# Patient Record
Sex: Male | Born: 1940 | Race: White | Hispanic: No | Marital: Married | Smoking: Former smoker
Health system: Southern US, Community
[De-identification: ages and names within clinical notes are randomized; demographics above are authoritative.]

## PROBLEM LIST (undated history)

## (undated) DIAGNOSIS — Z85038 Personal history of other malignant neoplasm of large intestine: Secondary | ICD-10-CM

## (undated) DIAGNOSIS — N321 Vesicointestinal fistula: Secondary | ICD-10-CM

## (undated) DIAGNOSIS — N183 Chronic kidney disease, stage 3 unspecified: Secondary | ICD-10-CM

## (undated) DIAGNOSIS — Z960 Presence of urogenital implants: Secondary | ICD-10-CM

## (undated) DIAGNOSIS — N135 Crossing vessel and stricture of ureter without hydronephrosis: Secondary | ICD-10-CM

## (undated) DIAGNOSIS — Z87448 Personal history of other diseases of urinary system: Secondary | ICD-10-CM

## (undated) DIAGNOSIS — Z978 Presence of other specified devices: Secondary | ICD-10-CM

## (undated) DIAGNOSIS — Z932 Ileostomy status: Secondary | ICD-10-CM

## (undated) DIAGNOSIS — Z936 Other artificial openings of urinary tract status: Secondary | ICD-10-CM

## (undated) DIAGNOSIS — N179 Acute kidney failure, unspecified: Secondary | ICD-10-CM

## (undated) DIAGNOSIS — C19 Malignant neoplasm of rectosigmoid junction: Secondary | ICD-10-CM

## (undated) DIAGNOSIS — IMO0002 Reserved for concepts with insufficient information to code with codable children: Secondary | ICD-10-CM

## (undated) DIAGNOSIS — D509 Iron deficiency anemia, unspecified: Secondary | ICD-10-CM

## (undated) DIAGNOSIS — N189 Chronic kidney disease, unspecified: Secondary | ICD-10-CM

## (undated) HISTORY — DX: Reserved for concepts with insufficient information to code with codable children: IMO0002

## (undated) HISTORY — PX: TONSILLECTOMY: SUR1361

## (undated) HISTORY — PX: OTHER SURGICAL HISTORY: SHX169

---
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
## 2004-01-26 ENCOUNTER — Ambulatory Visit (HOSPITAL_COMMUNITY): Admission: RE | Admit: 2004-01-26 | Discharge: 2004-01-26 | Payer: Self-pay | Admitting: Gastroenterology

## 2004-01-26 ENCOUNTER — Encounter (INDEPENDENT_AMBULATORY_CARE_PROVIDER_SITE_OTHER): Payer: Self-pay | Admitting: *Deleted

## 2004-02-02 ENCOUNTER — Encounter: Admission: RE | Admit: 2004-02-02 | Discharge: 2004-02-02 | Payer: Self-pay | Admitting: Gastroenterology

## 2004-03-15 ENCOUNTER — Encounter (INDEPENDENT_AMBULATORY_CARE_PROVIDER_SITE_OTHER): Payer: Self-pay | Admitting: *Deleted

## 2004-03-15 ENCOUNTER — Inpatient Hospital Stay (HOSPITAL_COMMUNITY): Admission: RE | Admit: 2004-03-15 | Discharge: 2004-03-20 | Payer: Self-pay | Admitting: General Surgery

## 2004-03-15 HISTORY — PX: OTHER SURGICAL HISTORY: SHX169

## 2009-03-16 ENCOUNTER — Ambulatory Visit (HOSPITAL_COMMUNITY): Admission: RE | Admit: 2009-03-16 | Discharge: 2009-03-16 | Payer: Self-pay | Admitting: Urology

## 2009-03-16 HISTORY — PX: OTHER SURGICAL HISTORY: SHX169

## 2009-06-18 ENCOUNTER — Ambulatory Visit (HOSPITAL_BASED_OUTPATIENT_CLINIC_OR_DEPARTMENT_OTHER): Admission: RE | Admit: 2009-06-18 | Discharge: 2009-06-18 | Payer: Self-pay | Admitting: Urology

## 2009-08-07 ENCOUNTER — Inpatient Hospital Stay (HOSPITAL_COMMUNITY): Admission: RE | Admit: 2009-08-07 | Discharge: 2009-08-14 | Payer: Self-pay | Admitting: Surgery

## 2009-08-07 ENCOUNTER — Encounter (INDEPENDENT_AMBULATORY_CARE_PROVIDER_SITE_OTHER): Payer: Self-pay | Admitting: Surgery

## 2009-08-07 ENCOUNTER — Ambulatory Visit: Payer: Self-pay | Admitting: Vascular Surgery

## 2009-08-07 HISTORY — PX: COLON SURGERY: SHX602

## 2009-08-08 ENCOUNTER — Encounter (INDEPENDENT_AMBULATORY_CARE_PROVIDER_SITE_OTHER): Payer: Self-pay | Admitting: Surgery

## 2009-08-13 ENCOUNTER — Ambulatory Visit: Payer: Self-pay | Admitting: Physical Medicine & Rehabilitation

## 2009-08-14 ENCOUNTER — Ambulatory Visit: Payer: Self-pay | Admitting: Oncology

## 2009-09-04 ENCOUNTER — Ambulatory Visit: Admission: RE | Admit: 2009-09-04 | Discharge: 2009-10-08 | Payer: Self-pay | Admitting: Radiation Oncology

## 2009-09-07 LAB — MORPHOLOGY: PLT EST: ADEQUATE

## 2009-09-07 LAB — CBC WITH DIFFERENTIAL/PLATELET
BASO%: 0.4 % (ref 0.0–2.0)
HGB: 9.5 g/dL — ABNORMAL LOW (ref 13.0–17.1)
LYMPH%: 18.1 % (ref 14.0–49.0)
NEUT#: 3.6 10*3/uL (ref 1.5–6.5)
NEUT%: 70.2 % (ref 39.0–75.0)
Platelets: 230 10*3/uL (ref 140–400)
RDW: 15.4 % — ABNORMAL HIGH (ref 11.0–14.6)
lymph#: 0.9 10*3/uL (ref 0.9–3.3)

## 2009-09-09 LAB — IRON AND TIBC
%SAT: 9 % — ABNORMAL LOW (ref 20–55)
TIBC: 293 ug/dL (ref 215–435)

## 2009-09-09 LAB — LACTATE DEHYDROGENASE: LDH: 85 U/L — ABNORMAL LOW (ref 94–250)

## 2009-09-09 LAB — CANCER ANTIGEN 19-9: CA 19-9: 84.2 U/mL — ABNORMAL HIGH (ref <35.0)

## 2009-09-09 LAB — COMPREHENSIVE METABOLIC PANEL
Albumin: 3.4 g/dL — ABNORMAL LOW (ref 3.5–5.2)
BUN: 19 mg/dL (ref 6–23)
Sodium: 142 mEq/L (ref 135–145)
Total Protein: 5.2 g/dL — ABNORMAL LOW (ref 6.0–8.3)

## 2009-09-17 ENCOUNTER — Ambulatory Visit (HOSPITAL_COMMUNITY): Admission: RE | Admit: 2009-09-17 | Discharge: 2009-09-17 | Payer: Self-pay | Admitting: Surgery

## 2009-09-17 HISTORY — PX: PORTACATH PLACEMENT: SHX2246

## 2009-09-18 ENCOUNTER — Ambulatory Visit (HOSPITAL_COMMUNITY): Admission: RE | Admit: 2009-09-18 | Discharge: 2009-09-18 | Payer: Self-pay | Admitting: Oncology

## 2009-09-18 ENCOUNTER — Ambulatory Visit: Payer: Self-pay | Admitting: Oncology

## 2009-09-20 LAB — COMPREHENSIVE METABOLIC PANEL
BUN: 18 mg/dL (ref 6–23)
CO2: 27 mEq/L (ref 19–32)
Calcium: 9.1 mg/dL (ref 8.4–10.5)
Chloride: 108 mEq/L (ref 96–112)
Creatinine, Ser: 1.09 mg/dL (ref 0.40–1.50)
Glucose, Bld: 101 mg/dL — ABNORMAL HIGH (ref 70–99)
Total Bilirubin: 0.4 mg/dL (ref 0.3–1.2)

## 2009-09-20 LAB — CBC WITH DIFFERENTIAL/PLATELET
Basophils Absolute: 0 10*3/uL (ref 0.0–0.1)
Eosinophils Absolute: 0.1 10*3/uL (ref 0.0–0.5)
HCT: 28.8 % — ABNORMAL LOW (ref 38.4–49.9)
HGB: 9.6 g/dL — ABNORMAL LOW (ref 13.0–17.1)
MCH: 29.9 pg (ref 27.2–33.4)
MCHC: 33.4 g/dL (ref 32.0–36.0)
NEUT%: 80.7 % — ABNORMAL HIGH (ref 39.0–75.0)
RBC: 3.23 10*6/uL — ABNORMAL LOW (ref 4.20–5.82)
RDW: 15.2 % — ABNORMAL HIGH (ref 11.0–14.6)
WBC: 6.8 10*3/uL (ref 4.0–10.3)
lymph#: 0.8 10*3/uL — ABNORMAL LOW (ref 0.9–3.3)

## 2009-09-20 LAB — CEA: CEA: 0.7 ng/mL (ref 0.0–5.0)

## 2009-09-26 LAB — CBC WITH DIFFERENTIAL/PLATELET
Basophils Absolute: 0 10*3/uL (ref 0.0–0.1)
EOS%: 1 % (ref 0.0–7.0)
Eosinophils Absolute: 0.1 10*3/uL (ref 0.0–0.5)
HGB: 10.1 g/dL — ABNORMAL LOW (ref 13.0–17.1)
MCH: 29.6 pg (ref 27.2–33.4)
MONO#: 0.5 10*3/uL (ref 0.1–0.9)
NEUT#: 4.1 10*3/uL (ref 1.5–6.5)
RDW: 14.9 % — ABNORMAL HIGH (ref 11.0–14.6)
WBC: 5.5 10*3/uL (ref 4.0–10.3)
lymph#: 0.8 10*3/uL — ABNORMAL LOW (ref 0.9–3.3)

## 2009-10-03 LAB — CBC WITH DIFFERENTIAL/PLATELET
Basophils Absolute: 0 10*3/uL (ref 0.0–0.1)
Eosinophils Absolute: 0.2 10*3/uL (ref 0.0–0.5)
HGB: 10.1 g/dL — ABNORMAL LOW (ref 13.0–17.1)
LYMPH%: 7.7 % — ABNORMAL LOW (ref 14.0–49.0)
MONO#: 0.4 10*3/uL (ref 0.1–0.9)
NEUT#: 10 10*3/uL — ABNORMAL HIGH (ref 1.5–6.5)
Platelets: 196 10*3/uL (ref 140–400)
RBC: 3.38 10*6/uL — ABNORMAL LOW (ref 4.20–5.82)
WBC: 11.6 10*3/uL — ABNORMAL HIGH (ref 4.0–10.3)

## 2009-10-09 LAB — CBC WITH DIFFERENTIAL/PLATELET
BASO%: 0 % (ref 0.0–2.0)
Basophils Absolute: 0 10*3/uL (ref 0.0–0.1)
EOS%: 0.6 % (ref 0.0–7.0)
Eosinophils Absolute: 0.1 10*3/uL (ref 0.0–0.5)
LYMPH%: 8.2 % — ABNORMAL LOW (ref 14.0–49.0)
MCH: 30.3 pg (ref 27.2–33.4)
MCV: 89.9 fL (ref 79.3–98.0)
NEUT%: 86.8 % — ABNORMAL HIGH (ref 39.0–75.0)
Platelets: 205 10*3/uL (ref 140–400)
RBC: 3.47 10*6/uL — ABNORMAL LOW (ref 4.20–5.82)
lymph#: 0.9 10*3/uL (ref 0.9–3.3)

## 2009-10-09 LAB — COMPREHENSIVE METABOLIC PANEL
ALT: 9 U/L (ref 0–53)
Alkaline Phosphatase: 85 U/L (ref 39–117)
BUN: 17 mg/dL (ref 6–23)
CO2: 29 mEq/L (ref 19–32)
Calcium: 9 mg/dL (ref 8.4–10.5)
Creatinine, Ser: 1 mg/dL (ref 0.40–1.50)
Glucose, Bld: 92 mg/dL (ref 70–99)
Total Bilirubin: 0.3 mg/dL (ref 0.3–1.2)

## 2009-10-10 LAB — CBC WITH DIFFERENTIAL/PLATELET
Basophils Absolute: 0 10*3/uL (ref 0.0–0.1)
HCT: 30.8 % — ABNORMAL LOW (ref 38.4–49.9)
LYMPH%: 9.5 % — ABNORMAL LOW (ref 14.0–49.0)
MCHC: 33.3 g/dL (ref 32.0–36.0)
Platelets: 198 10*3/uL (ref 140–400)
RBC: 3.41 10*6/uL — ABNORMAL LOW (ref 4.20–5.82)
lymph#: 1 10*3/uL (ref 0.9–3.3)

## 2009-10-18 ENCOUNTER — Ambulatory Visit: Payer: Self-pay | Admitting: Oncology

## 2009-10-23 LAB — CBC WITH DIFFERENTIAL/PLATELET
BASO%: 0.2 % (ref 0.0–2.0)
Basophils Absolute: 0 10*3/uL (ref 0.0–0.1)
EOS%: 0.8 % (ref 0.0–7.0)
Eosinophils Absolute: 0.1 10*3/uL (ref 0.0–0.5)
MCH: 30.5 pg (ref 27.2–33.4)
MCHC: 33.6 g/dL (ref 32.0–36.0)
MONO#: 0.8 10*3/uL (ref 0.1–0.9)
MONO%: 6.8 % (ref 0.0–14.0)
NEUT%: 83.3 % — ABNORMAL HIGH (ref 39.0–75.0)
Platelets: 214 10*3/uL (ref 140–400)
lymph#: 1.1 10*3/uL (ref 0.9–3.3)

## 2009-10-23 LAB — COMPREHENSIVE METABOLIC PANEL
Albumin: 4.2 g/dL (ref 3.5–5.2)
Alkaline Phosphatase: 108 U/L (ref 39–117)
BUN: 24 mg/dL — ABNORMAL HIGH (ref 6–23)
Calcium: 9.7 mg/dL (ref 8.4–10.5)
Creatinine, Ser: 1.15 mg/dL (ref 0.40–1.50)
Sodium: 142 mEq/L (ref 135–145)
Total Bilirubin: 0.4 mg/dL (ref 0.3–1.2)
Total Protein: 6.3 g/dL (ref 6.0–8.3)

## 2009-10-23 LAB — LACTATE DEHYDROGENASE: LDH: 134 U/L (ref 94–250)

## 2009-10-24 LAB — CBC WITH DIFFERENTIAL/PLATELET
BASO%: 0.4 % (ref 0.0–2.0)
Eosinophils Absolute: 0.1 10*3/uL (ref 0.0–0.5)
HCT: 33.1 % — ABNORMAL LOW (ref 38.4–49.9)
HGB: 11.1 g/dL — ABNORMAL LOW (ref 13.0–17.1)
MCHC: 33.5 g/dL (ref 32.0–36.0)
MONO%: 6.4 % (ref 0.0–14.0)
Platelets: 217 10*3/uL (ref 140–400)
RDW: 19.5 % — ABNORMAL HIGH (ref 11.0–14.6)
WBC: 8.6 10*3/uL (ref 4.0–10.3)
lymph#: 0.8 10*3/uL — ABNORMAL LOW (ref 0.9–3.3)

## 2009-11-06 ENCOUNTER — Ambulatory Visit
Admission: RE | Admit: 2009-11-06 | Discharge: 2010-02-04 | Payer: Self-pay | Source: Home / Self Care | Attending: Radiation Oncology | Admitting: Radiation Oncology

## 2009-11-07 LAB — CBC WITH DIFFERENTIAL/PLATELET
BASO%: 0.1 % (ref 0.0–2.0)
EOS%: 1.1 % (ref 0.0–7.0)
HCT: 35 % — ABNORMAL LOW (ref 38.4–49.9)
MCHC: 34.5 g/dL (ref 32.0–36.0)
MONO#: 0.7 10*3/uL (ref 0.1–0.9)
MONO%: 7.8 % (ref 0.0–14.0)
lymph#: 0.9 10*3/uL (ref 0.9–3.3)

## 2009-11-08 LAB — RESEARCH LABS

## 2009-11-09 LAB — COMPREHENSIVE METABOLIC PANEL
Albumin: 4.1 g/dL (ref 3.5–5.2)
Alkaline Phosphatase: 111 U/L (ref 39–117)
CO2: 26 mEq/L (ref 19–32)
Calcium: 9.8 mg/dL (ref 8.4–10.5)
Chloride: 106 mEq/L (ref 96–112)
Creatinine, Ser: 1.02 mg/dL (ref 0.40–1.50)
Glucose, Bld: 87 mg/dL (ref 70–99)
Potassium: 4 mEq/L (ref 3.5–5.3)
Sodium: 141 mEq/L (ref 135–145)
Total Bilirubin: 0.4 mg/dL (ref 0.3–1.2)
Total Protein: 5.9 g/dL — ABNORMAL LOW (ref 6.0–8.3)

## 2009-11-09 LAB — IRON AND TIBC
%SAT: 20 % (ref 20–55)
TIBC: 298 ug/dL (ref 215–435)

## 2009-11-09 LAB — LACTATE DEHYDROGENASE: LDH: 132 U/L (ref 94–250)

## 2009-11-09 LAB — FERRITIN: Ferritin: 752 ng/mL — ABNORMAL HIGH (ref 22–322)

## 2009-11-19 ENCOUNTER — Ambulatory Visit: Payer: Self-pay | Admitting: Oncology

## 2009-11-20 LAB — COMPREHENSIVE METABOLIC PANEL
ALT: 41 U/L (ref 0–53)
AST: 28 U/L (ref 0–37)
Albumin: 4.1 g/dL (ref 3.5–5.2)
Chloride: 108 mEq/L (ref 96–112)
Potassium: 3.8 mEq/L (ref 3.5–5.3)
Sodium: 142 mEq/L (ref 135–145)
Total Protein: 6.3 g/dL (ref 6.0–8.3)

## 2009-11-20 LAB — CBC WITH DIFFERENTIAL/PLATELET
BASO%: 0.3 % (ref 0.0–2.0)
Basophils Absolute: 0 10*3/uL (ref 0.0–0.1)
LYMPH%: 13.1 % — ABNORMAL LOW (ref 14.0–49.0)
MCHC: 34.4 g/dL (ref 32.0–36.0)
NEUT#: 7.6 10*3/uL — ABNORMAL HIGH (ref 1.5–6.5)
Platelets: 145 10*3/uL (ref 140–400)
RBC: 3.6 10*6/uL — ABNORMAL LOW (ref 4.20–5.82)
WBC: 9.7 10*3/uL (ref 4.0–10.3)

## 2009-11-20 LAB — IRON AND TIBC: TIBC: 318 ug/dL (ref 215–435)

## 2009-11-20 LAB — FERRITIN: Ferritin: 772 ng/mL — ABNORMAL HIGH (ref 22–322)

## 2009-11-20 LAB — LACTATE DEHYDROGENASE: LDH: 128 U/L (ref 94–250)

## 2009-12-04 LAB — CBC WITH DIFFERENTIAL/PLATELET
Basophils Absolute: 0 10*3/uL (ref 0.0–0.1)
HCT: 34.1 % — ABNORMAL LOW (ref 38.4–49.9)
HGB: 11.5 g/dL — ABNORMAL LOW (ref 13.0–17.1)
MCH: 32 pg (ref 27.2–33.4)
MONO#: 0.8 10*3/uL (ref 0.1–0.9)
NEUT%: 76.3 % — ABNORMAL HIGH (ref 39.0–75.0)
WBC: 8.2 10*3/uL (ref 4.0–10.3)
lymph#: 1.1 10*3/uL (ref 0.9–3.3)

## 2009-12-04 LAB — COMPREHENSIVE METABOLIC PANEL
BUN: 13 mg/dL (ref 6–23)
CO2: 30 mEq/L (ref 19–32)
Calcium: 9.5 mg/dL (ref 8.4–10.5)
Chloride: 108 mEq/L (ref 96–112)
Creatinine, Ser: 0.96 mg/dL (ref 0.40–1.50)

## 2009-12-04 LAB — LACTATE DEHYDROGENASE: LDH: 103 U/L (ref 94–250)

## 2009-12-10 LAB — CBC WITH DIFFERENTIAL/PLATELET
Basophils Absolute: 0 10*3/uL (ref 0.0–0.1)
EOS%: 1.3 % (ref 0.0–7.0)
Eosinophils Absolute: 0.1 10*3/uL (ref 0.0–0.5)
HGB: 11.6 g/dL — ABNORMAL LOW (ref 13.0–17.1)
NEUT#: 5.6 10*3/uL (ref 1.5–6.5)
RDW: 18.2 % — ABNORMAL HIGH (ref 11.0–14.6)
lymph#: 1.4 10*3/uL (ref 0.9–3.3)

## 2009-12-21 ENCOUNTER — Ambulatory Visit: Payer: Self-pay | Admitting: Oncology

## 2009-12-24 LAB — CBC WITH DIFFERENTIAL/PLATELET
Eosinophils Absolute: 0.1 10*3/uL (ref 0.0–0.5)
LYMPH%: 13.6 % — ABNORMAL LOW (ref 14.0–49.0)
MONO#: 0.5 10*3/uL (ref 0.1–0.9)
NEUT#: 7.1 10*3/uL — ABNORMAL HIGH (ref 1.5–6.5)
Platelets: 180 10*3/uL (ref 140–400)
RBC: 3.69 10*6/uL — ABNORMAL LOW (ref 4.20–5.82)
WBC: 8.9 10*3/uL (ref 4.0–10.3)
lymph#: 1.2 10*3/uL (ref 0.9–3.3)
nRBC: 0 % (ref 0–0)

## 2009-12-24 LAB — COMPREHENSIVE METABOLIC PANEL
ALT: 18 U/L (ref 0–53)
AST: 17 U/L (ref 0–37)
Albumin: 3.8 g/dL (ref 3.5–5.2)
BUN: 24 mg/dL — ABNORMAL HIGH (ref 6–23)
Calcium: 9.3 mg/dL (ref 8.4–10.5)
Chloride: 109 mEq/L (ref 96–112)
Potassium: 4.1 mEq/L (ref 3.5–5.3)
Sodium: 142 mEq/L (ref 135–145)
Total Protein: 5.8 g/dL — ABNORMAL LOW (ref 6.0–8.3)

## 2009-12-24 LAB — LACTATE DEHYDROGENASE: LDH: 97 U/L (ref 94–250)

## 2010-01-07 LAB — CBC WITH DIFFERENTIAL/PLATELET
BASO%: 0.3 % (ref 0.0–2.0)
MCHC: 34.2 g/dL (ref 32.0–36.0)
MONO#: 0.7 10*3/uL (ref 0.1–0.9)
RBC: 3.7 10*6/uL — ABNORMAL LOW (ref 4.20–5.82)
RDW: 16.2 % — ABNORMAL HIGH (ref 11.0–14.6)
WBC: 8.7 10*3/uL (ref 4.0–10.3)
lymph#: 1.2 10*3/uL (ref 0.9–3.3)
nRBC: 0 % (ref 0–0)

## 2010-01-07 LAB — COMPREHENSIVE METABOLIC PANEL
Albumin: 3.9 g/dL (ref 3.5–5.2)
CO2: 27 mEq/L (ref 19–32)
Glucose, Bld: 83 mg/dL (ref 70–99)
Potassium: 4.1 mEq/L (ref 3.5–5.3)
Sodium: 143 mEq/L (ref 135–145)
Total Bilirubin: 0.3 mg/dL (ref 0.3–1.2)
Total Protein: 6 g/dL (ref 6.0–8.3)

## 2010-01-07 LAB — LACTATE DEHYDROGENASE: LDH: 115 U/L (ref 94–250)

## 2010-01-14 LAB — CBC WITH DIFFERENTIAL/PLATELET
Basophils Absolute: 0 10*3/uL (ref 0.0–0.1)
EOS%: 1.4 % (ref 0.0–7.0)
Eosinophils Absolute: 0.1 10*3/uL (ref 0.0–0.5)
HGB: 11.5 g/dL — ABNORMAL LOW (ref 13.0–17.1)
MCH: 33 pg (ref 27.2–33.4)
NEUT#: 3.7 10*3/uL (ref 1.5–6.5)
RDW: 15.1 % — ABNORMAL HIGH (ref 11.0–14.6)
lymph#: 0.7 10*3/uL — ABNORMAL LOW (ref 0.9–3.3)

## 2010-01-21 ENCOUNTER — Ambulatory Visit: Payer: Self-pay | Admitting: Oncology

## 2010-01-21 LAB — COMPREHENSIVE METABOLIC PANEL
ALT: 13 U/L (ref 0–53)
Alkaline Phosphatase: 56 U/L (ref 39–117)
CO2: 24 mEq/L (ref 19–32)
Creatinine, Ser: 1.16 mg/dL (ref 0.40–1.50)
Total Bilirubin: 0.4 mg/dL (ref 0.3–1.2)

## 2010-01-21 LAB — CBC WITH DIFFERENTIAL/PLATELET
Basophils Absolute: 0 10*3/uL (ref 0.0–0.1)
Eosinophils Absolute: 0.1 10*3/uL (ref 0.0–0.5)
HGB: 10.8 g/dL — ABNORMAL LOW (ref 13.0–17.1)
MCV: 96 fL (ref 79.3–98.0)
MONO#: 0.7 10*3/uL (ref 0.1–0.9)
MONO%: 14.2 % — ABNORMAL HIGH (ref 0.0–14.0)
NEUT#: 3.8 10*3/uL (ref 1.5–6.5)
RDW: 14.9 % — ABNORMAL HIGH (ref 11.0–14.6)

## 2010-01-21 LAB — LACTATE DEHYDROGENASE: LDH: 94 U/L (ref 94–250)

## 2010-02-05 ENCOUNTER — Ambulatory Visit
Admission: RE | Admit: 2010-02-05 | Discharge: 2010-02-15 | Payer: Self-pay | Source: Home / Self Care | Attending: Radiation Oncology | Admitting: Radiation Oncology

## 2010-02-05 LAB — CBC WITH DIFFERENTIAL/PLATELET
EOS%: 4.9 % (ref 0.0–7.0)
Eosinophils Absolute: 0.3 10*3/uL (ref 0.0–0.5)
HCT: 32.2 % — ABNORMAL LOW (ref 38.4–49.9)
LYMPH%: 5.5 % — ABNORMAL LOW (ref 14.0–49.0)
MCH: 33.4 pg (ref 27.2–33.4)
MCHC: 35.1 g/dL (ref 32.0–36.0)
Platelets: 201 10*3/uL (ref 140–400)
WBC: 5.3 10*3/uL (ref 4.0–10.3)

## 2010-02-11 LAB — COMPREHENSIVE METABOLIC PANEL
ALT: 12 U/L (ref 0–53)
AST: 11 U/L (ref 0–37)
Albumin: 3.6 g/dL (ref 3.5–5.2)
Alkaline Phosphatase: 53 U/L (ref 39–117)
BUN: 21 mg/dL (ref 6–23)
CO2: 26 mEq/L (ref 19–32)
Calcium: 9.2 mg/dL (ref 8.4–10.5)
Chloride: 109 mEq/L (ref 96–112)
Creatinine, Ser: 0.96 mg/dL (ref 0.40–1.50)
Glucose, Bld: 92 mg/dL (ref 70–99)
Potassium: 4.1 mEq/L (ref 3.5–5.3)
Sodium: 143 mEq/L (ref 135–145)
Total Bilirubin: 0.3 mg/dL (ref 0.3–1.2)
Total Protein: 5.2 g/dL — ABNORMAL LOW (ref 6.0–8.3)

## 2010-02-11 LAB — CBC WITH DIFFERENTIAL/PLATELET
BASO%: 0.1 % (ref 0.0–2.0)
Basophils Absolute: 0 10*3/uL (ref 0.0–0.1)
EOS%: 5.7 % (ref 0.0–7.0)
Eosinophils Absolute: 0.2 10*3/uL (ref 0.0–0.5)
HCT: 31 % — ABNORMAL LOW (ref 38.4–49.9)
HGB: 10.7 g/dL — ABNORMAL LOW (ref 13.0–17.1)
LYMPH%: 6.1 % — ABNORMAL LOW (ref 14.0–49.0)
MCH: 34.7 pg — ABNORMAL HIGH (ref 27.2–33.4)
MCHC: 34.5 g/dL (ref 32.0–36.0)
MCV: 100.7 fL — ABNORMAL HIGH (ref 79.3–98.0)
MONO#: 0.4 10*3/uL (ref 0.1–0.9)
MONO%: 10.7 % (ref 0.0–14.0)
NEUT#: 3 10*3/uL (ref 1.5–6.5)
NEUT%: 77.4 % — ABNORMAL HIGH (ref 39.0–75.0)
Platelets: 200 10*3/uL (ref 140–400)
RBC: 3.08 10*6/uL — ABNORMAL LOW (ref 4.20–5.82)
RDW: 16.6 % — ABNORMAL HIGH (ref 11.0–14.6)
WBC: 3.9 10*3/uL — ABNORMAL LOW (ref 4.0–10.3)
lymph#: 0.2 10*3/uL — ABNORMAL LOW (ref 0.9–3.3)

## 2010-02-11 LAB — LACTATE DEHYDROGENASE: LDH: 97 U/L (ref 94–250)

## 2010-02-28 ENCOUNTER — Ambulatory Visit: Payer: Self-pay | Admitting: Oncology

## 2010-03-01 ENCOUNTER — Ambulatory Visit
Admission: RE | Admit: 2010-03-01 | Discharge: 2010-03-01 | Payer: Self-pay | Source: Home / Self Care | Attending: Urology | Admitting: Urology

## 2010-03-04 LAB — CBC WITH DIFFERENTIAL/PLATELET
Eosinophils Absolute: 0.1 10*3/uL (ref 0.0–0.5)
HCT: 33.6 % — ABNORMAL LOW (ref 38.4–49.9)
HGB: 11.7 g/dL — ABNORMAL LOW (ref 13.0–17.1)
LYMPH%: 9.8 % — ABNORMAL LOW (ref 14.0–49.0)
MCHC: 34.8 g/dL (ref 32.0–36.0)
MCV: 97.7 fL (ref 79.3–98.0)
NEUT%: 81.9 % — ABNORMAL HIGH (ref 39.0–75.0)
Platelets: ADEQUATE 10*3/uL (ref 140–400)
RBC: 3.44 10*6/uL — ABNORMAL LOW (ref 4.20–5.82)
lymph#: 0.5 10*3/uL — ABNORMAL LOW (ref 0.9–3.3)

## 2010-03-04 LAB — COMPREHENSIVE METABOLIC PANEL
ALT: 14 U/L (ref 0–53)
Albumin: 3.9 g/dL (ref 3.5–5.2)
Alkaline Phosphatase: 61 U/L (ref 39–117)
Creatinine, Ser: 1.11 mg/dL (ref 0.40–1.50)
Glucose, Bld: 85 mg/dL (ref 70–99)
Total Protein: 5.7 g/dL — ABNORMAL LOW (ref 6.0–8.3)

## 2010-03-06 ENCOUNTER — Ambulatory Visit (HOSPITAL_BASED_OUTPATIENT_CLINIC_OR_DEPARTMENT_OTHER): Payer: BC Managed Care – PPO | Admitting: Oncology

## 2010-03-06 NOTE — Op Note (Signed)
NAME:  Eric Munoz, Eric Munoz NO.:  [id number]  MEDICAL RECORD NO.:  [id number]          PATIENT TYPE:  AMB  LOCATION:  [facility]                         FACILITY:  [name]  PHYSICIAN:  Bertram Millard. Jeramy Dimmick, M.D.DATE OF BIRTH:  08-29-1940  DATE OF PROCEDURE:  03/01/2010 DATE OF DISCHARGE:                              OPERATIVE REPORT   PREOPERATIVE DIAGNOSES:  Left hydronephrosis; migrated left ureteral stent.  POSTOPERATIVE DIAGNOSES:  Left hydronephrosis; migrated left ureteral stent.  PROCEDURES:  Cystoscopy, left ureteroscopy, ureteroscopic extraction of migrated left ureteral stent, left retrograde ureteral pyelogram, replacement of double-J stent on the left (7-French x 28-cm contour).  SURGEON:  Bertram Millard. Dorissa Stinnette, M.D.  ANESTHESIA:  General LMA.  COMPLICATIONS:  None.  BRIEF HISTORY:  70 year old male with recurrent adenocarcinoma, high- grade, of his pelvis/retroperitoneum.  He has had a history of adenocarcinoma of the colon with resection in the past.  Urologic consultation was requested intraoperatively in early July 2011.  The patient's left ureter was encased with a tumor.  He underwent ureteral lysis, and Dr. Patsi Sears placed a double-J stent at that time.  He presented to the office for possible extraction recently, as he has completed most of his adjuvant chemotherapy.  Cystoscopy revealed no evidence of the stent within the patient's bladder.  It was evident that the stent had migrated proximally.  I have recommended that we proceed with ureteroscopy and extraction of his migrated stent and possible observation versus replacement of the stent depending on retrograde appearance of the patient's left ureter.  Risks and complications have been discussed with the patient.  He understands these and desires to proceed.  DESCRIPTION OF PROCEDURE:  The patient was identified and marked in the holding area.  He received preoperative IV antibiotics.  He  was taken to the operating room where general anesthetic was administered.  He was placed in the dorsal lithotomy position.  Genitalia and perineum were prepped and draped.  Time-out was then called.  The procedure then commenced.  A 22-French panendoscope was advanced into his bladder.  Urethra and prostate were unremarkable, as was the bladder.  The left ureteral orifice was identified.  A guidewire was placed up as far as I could proximally in the ureter using fluoroscopic guidance.  It progressed about halfway up the ureter before I could not pass it any more.  I then removed the cystoscope and placed the ureteroscope through the urethra and into the bladder.  It was directed up into the left ureter.  The distal end of the double-J stent was obtained and I then grasped the end of the stent with a Nitinol basket and extracted the stent into the bladder.  The distal end of the stent eventually came out to the meatus.  I then placed another sensor tip guidewire up the stent and through the stent and I saw that the tip of the guidewire had passed into the area of the renal pelvis.  It was quite difficult to get the stent out through the mid ureter.  However, with gentle constant traction, I was able to eventually dislodge the stent and  remove it from the patient's body.  Over top of the first guidewire that was placed, I placed a 6 open-ended catheter, removed the guidewire, and shot a retrograde.  This revealed a normal distal ureter, but the ureter in the mid aspect was significantly deviated medially, probably due to the patient's prior surgery.  Additionally, there was significant narrowing of this ureter for approximately 3 cm.  There was proximal ureterectasis.  Because of this significant stricture, I felt it necessary to replace the stent.  The open-ended catheter was removed and over top of the remaining guidewire that had been placed into the renal pelvis, using cystoscopic  guidance, I placed a 28-cm x 7-French contour stent.  After adequate positioning was seen using fluoroscopic guidance, the guidewire was removed.  Good proximal and distal curls were seen on the stent using fluoroscopic guidance and fluoroscopic and cystoscopic visualization, respectively.  At this time, the bladder was drained.  The scope was removed.  A B and O suppository had been placed.  The patient was awakened and taken to the PACU in stable condition.  He will follow up on the 17th of February.  He was discharged on 3 days of Bactrim DS 1 p.o. b.i.d. as well as oxybutynin p.r.n. bladder spasms.     Bertram Millard. Reyna Lorenzi, M.D.     SMD/MEDQ  D:  03/01/2010  T:  03/01/2010  Job:  [id number]  cc:   [name], MD [address] [place] [address]  Samul Dada, M.D. Fax: [phone]  Radene Gunning, MD, PhD Fax: [phone]  Dr. Duane Lope  Electronically Signed by [name] M.D. on 03/06/2010 06:04:34 PM

## 2010-03-11 ENCOUNTER — Encounter: Payer: BC Managed Care – PPO | Admitting: Oncology

## 2010-03-11 ENCOUNTER — Other Ambulatory Visit (HOSPITAL_COMMUNITY): Payer: Self-pay | Admitting: Oncology

## 2010-03-11 DIAGNOSIS — Z452 Encounter for adjustment and management of vascular access device: Secondary | ICD-10-CM

## 2010-03-11 DIAGNOSIS — C19 Malignant neoplasm of rectosigmoid junction: Secondary | ICD-10-CM

## 2010-03-11 DIAGNOSIS — Z5111 Encounter for antineoplastic chemotherapy: Secondary | ICD-10-CM

## 2010-03-11 LAB — CBC WITH DIFFERENTIAL/PLATELET
Basophils Absolute: 0 10*3/uL (ref 0.0–0.1)
Eosinophils Absolute: 0.1 10*3/uL (ref 0.0–0.5)
HGB: 11 g/dL — ABNORMAL LOW (ref 13.0–17.1)
LYMPH%: 12 % — ABNORMAL LOW (ref 14.0–49.0)
MCH: 34.4 pg — ABNORMAL HIGH (ref 27.2–33.4)
MCHC: 34.8 g/dL (ref 32.0–36.0)
MCV: 98.7 fL — ABNORMAL HIGH (ref 79.3–98.0)
MONO#: 0.3 10*3/uL (ref 0.1–0.9)
NEUT#: 2.4 10*3/uL (ref 1.5–6.5)
Platelets: 173 10*3/uL (ref 140–400)
RDW: 13.5 % (ref 11.0–14.6)
WBC: 3.2 10*3/uL — ABNORMAL LOW (ref 4.0–10.3)

## 2010-03-18 ENCOUNTER — Encounter (HOSPITAL_BASED_OUTPATIENT_CLINIC_OR_DEPARTMENT_OTHER): Payer: BC Managed Care – PPO | Admitting: Oncology

## 2010-03-18 ENCOUNTER — Other Ambulatory Visit (HOSPITAL_COMMUNITY): Payer: Self-pay | Admitting: Oncology

## 2010-03-18 DIAGNOSIS — G609 Hereditary and idiopathic neuropathy, unspecified: Secondary | ICD-10-CM

## 2010-03-18 DIAGNOSIS — C19 Malignant neoplasm of rectosigmoid junction: Secondary | ICD-10-CM

## 2010-03-18 DIAGNOSIS — Z5111 Encounter for antineoplastic chemotherapy: Secondary | ICD-10-CM

## 2010-03-18 DIAGNOSIS — Z452 Encounter for adjustment and management of vascular access device: Secondary | ICD-10-CM

## 2010-03-18 LAB — COMPREHENSIVE METABOLIC PANEL
ALT: 11 U/L (ref 0–53)
AST: 16 U/L (ref 0–37)
Chloride: 107 mEq/L (ref 96–112)
Potassium: 4 mEq/L (ref 3.5–5.3)
Total Bilirubin: 0.3 mg/dL (ref 0.3–1.2)
Total Protein: 5.5 g/dL — ABNORMAL LOW (ref 6.0–8.3)

## 2010-03-18 LAB — CBC WITH DIFFERENTIAL/PLATELET
Basophils Absolute: 0 10*3/uL (ref 0.0–0.1)
Eosinophils Absolute: 0.1 10*3/uL (ref 0.0–0.5)
HGB: 11.2 g/dL — ABNORMAL LOW (ref 13.0–17.1)
MCH: 32.9 pg (ref 27.2–33.4)
MCHC: 34.1 g/dL (ref 32.0–36.0)
NEUT#: 2.8 10*3/uL (ref 1.5–6.5)
NEUT%: 77.6 % — ABNORMAL HIGH (ref 39.0–75.0)

## 2010-03-20 ENCOUNTER — Encounter (HOSPITAL_BASED_OUTPATIENT_CLINIC_OR_DEPARTMENT_OTHER): Payer: BC Managed Care – PPO | Admitting: Oncology

## 2010-03-20 DIAGNOSIS — Z452 Encounter for adjustment and management of vascular access device: Secondary | ICD-10-CM

## 2010-03-20 DIAGNOSIS — C19 Malignant neoplasm of rectosigmoid junction: Secondary | ICD-10-CM

## 2010-03-29 ENCOUNTER — Ambulatory Visit: Payer: BC Managed Care – PPO | Attending: Radiation Oncology | Admitting: Radiation Oncology

## 2010-04-01 ENCOUNTER — Other Ambulatory Visit (HOSPITAL_COMMUNITY): Payer: Self-pay | Admitting: Oncology

## 2010-04-01 ENCOUNTER — Encounter (HOSPITAL_BASED_OUTPATIENT_CLINIC_OR_DEPARTMENT_OTHER): Payer: BC Managed Care – PPO | Admitting: Oncology

## 2010-04-01 DIAGNOSIS — Z5111 Encounter for antineoplastic chemotherapy: Secondary | ICD-10-CM

## 2010-04-01 DIAGNOSIS — C19 Malignant neoplasm of rectosigmoid junction: Secondary | ICD-10-CM

## 2010-04-01 DIAGNOSIS — G609 Hereditary and idiopathic neuropathy, unspecified: Secondary | ICD-10-CM

## 2010-04-01 DIAGNOSIS — R197 Diarrhea, unspecified: Secondary | ICD-10-CM

## 2010-04-01 LAB — COMPREHENSIVE METABOLIC PANEL
Albumin: 4 g/dL (ref 3.5–5.2)
Alkaline Phosphatase: 53 U/L (ref 39–117)
Calcium: 9.6 mg/dL (ref 8.4–10.5)
Chloride: 108 mEq/L (ref 96–112)
Glucose, Bld: 87 mg/dL (ref 70–99)
Potassium: 4 mEq/L (ref 3.5–5.3)
Sodium: 141 mEq/L (ref 135–145)
Total Protein: 6.1 g/dL (ref 6.0–8.3)

## 2010-04-01 LAB — CBC WITH DIFFERENTIAL/PLATELET
BASO%: 0.3 % (ref 0.0–2.0)
EOS%: 1.9 % (ref 0.0–7.0)
MCH: 33 pg (ref 27.2–33.4)
MCHC: 34.3 g/dL (ref 32.0–36.0)
MCV: 96.2 fL (ref 79.3–98.0)
MONO%: 14.7 % — ABNORMAL HIGH (ref 0.0–14.0)
RBC: 3.45 10*6/uL — ABNORMAL LOW (ref 4.20–5.82)
RDW: 13.8 % (ref 11.0–14.6)
lymph#: 0.3 10*3/uL — ABNORMAL LOW (ref 0.9–3.3)

## 2010-04-03 ENCOUNTER — Encounter: Payer: BC Managed Care – PPO | Admitting: Oncology

## 2010-04-19 LAB — SURGICAL PCR SCREEN: Staphylococcus aureus: POSITIVE — AB

## 2010-04-21 LAB — SURGICAL PCR SCREEN
MRSA, PCR: NEGATIVE
Staphylococcus aureus: NEGATIVE

## 2010-04-21 LAB — TYPE AND SCREEN: Antibody Screen: NEGATIVE

## 2010-04-21 LAB — CBC
HCT: 26.2 % — ABNORMAL LOW (ref 39.0–52.0)
HCT: 26.9 % — ABNORMAL LOW (ref 39.0–52.0)
HCT: 28.5 % — ABNORMAL LOW (ref 39.0–52.0)
Hemoglobin: 9.8 g/dL — ABNORMAL LOW (ref 13.0–17.0)
MCH: 31 pg (ref 26.0–34.0)
MCHC: 34.3 g/dL (ref 30.0–36.0)
MCHC: 34.6 g/dL (ref 30.0–36.0)
MCHC: 34.9 g/dL (ref 30.0–36.0)
MCV: 90.3 fL (ref 78.0–100.0)
MCV: 90.8 fL (ref 78.0–100.0)
MCV: 91 fL (ref 78.0–100.0)
Platelets: 197 10*3/uL (ref 150–400)
Platelets: 311 10*3/uL (ref 150–400)
Platelets: 307 10*3/uL (ref 150–400)
RBC: 3.14 MIL/uL — ABNORMAL LOW (ref 4.22–5.81)
RDW: 13.9 % (ref 11.5–15.5)
RDW: 14 % (ref 11.5–15.5)
RDW: 14 % (ref 11.5–15.5)
WBC: 11.1 10*3/uL — ABNORMAL HIGH (ref 4.0–10.5)
WBC: 6.2 10*3/uL (ref 4.0–10.5)

## 2010-04-21 LAB — PROTIME-INR
INR: 1.46 (ref 0.00–1.49)
INR: 1.62 — ABNORMAL HIGH (ref 0.00–1.49)
Prothrombin Time: 17.6 seconds — ABNORMAL HIGH (ref 11.6–15.2)
Prothrombin Time: 20.1 seconds — ABNORMAL HIGH (ref 11.6–15.2)

## 2010-04-21 LAB — BASIC METABOLIC PANEL
BUN: 19 mg/dL (ref 6–23)
BUN: 24 mg/dL — ABNORMAL HIGH (ref 6–23)
BUN: 30 mg/dL — ABNORMAL HIGH (ref 6–23)
CO2: 29 mEq/L (ref 19–32)
Calcium: 8.2 mg/dL — ABNORMAL LOW (ref 8.4–10.5)
Chloride: 108 mEq/L (ref 96–112)
Chloride: 109 mEq/L (ref 96–112)
Creatinine, Ser: 1.06 mg/dL (ref 0.4–1.5)
Creatinine, Ser: 1.21 mg/dL (ref 0.4–1.5)
Creatinine, Ser: 1.61 mg/dL — ABNORMAL HIGH (ref 0.4–1.5)
GFR calc non Af Amer: 37 mL/min — ABNORMAL LOW (ref >60)
GFR calc non Af Amer: 43 mL/min — ABNORMAL LOW (ref >60)
GFR calc non Af Amer: 60 mL/min — ABNORMAL LOW (ref >60)
Glucose, Bld: 100 mg/dL — ABNORMAL HIGH (ref 70–99)
Glucose, Bld: 91 mg/dL (ref 70–99)
Glucose, Bld: 93 mg/dL (ref 70–99)
Potassium: 3.5 mEq/L (ref 3.5–5.1)
Potassium: 4 mEq/L (ref 3.5–5.1)
Potassium: 4.7 mEq/L (ref 3.5–5.1)
Sodium: 138 mEq/L (ref 135–145)

## 2010-04-21 LAB — CREATININE, SERUM
Creatinine, Ser: 1.08 mg/dL (ref 0.4–1.5)
GFR calc non Af Amer: >60 mL/min (ref >60)

## 2010-04-21 LAB — COMPREHENSIVE METABOLIC PANEL
BUN: 16 mg/dL (ref 6–23)
Calcium: 9.4 mg/dL (ref 8.4–10.5)
Creatinine, Ser: 1.05 mg/dL (ref 0.4–1.5)
Glucose, Bld: 104 mg/dL — ABNORMAL HIGH (ref 70–99)
Total Protein: 6.4 g/dL (ref 6.0–8.3)

## 2010-04-21 LAB — PREPARE RBC (CROSSMATCH)

## 2010-04-21 LAB — POTASSIUM: Potassium: 3.5 mEq/L (ref 3.5–5.1)

## 2010-04-21 LAB — HEMOGLOBIN AND HEMATOCRIT, BLOOD
HCT: 21.3 % — ABNORMAL LOW (ref 39.0–52.0)
HCT: 31 % — ABNORMAL LOW (ref 39.0–52.0)
Hemoglobin: 10.8 g/dL — ABNORMAL LOW (ref 13.0–17.0)

## 2010-04-21 LAB — HEMOGLOBIN: Hemoglobin: 9.4 g/dL — ABNORMAL LOW (ref 13.0–17.0)

## 2010-04-22 LAB — POCT I-STAT 4, (NA,K, GLUC, HGB,HCT)
Glucose, Bld: 92 mg/dL (ref 70–99)
Potassium: 4 mEq/L (ref 3.5–5.1)
Sodium: 142 mEq/L (ref 135–145)

## 2010-04-29 ENCOUNTER — Other Ambulatory Visit (HOSPITAL_COMMUNITY): Payer: Self-pay | Admitting: Oncology

## 2010-04-29 ENCOUNTER — Encounter (HOSPITAL_BASED_OUTPATIENT_CLINIC_OR_DEPARTMENT_OTHER): Payer: BC Managed Care – PPO | Admitting: Oncology

## 2010-04-29 DIAGNOSIS — C19 Malignant neoplasm of rectosigmoid junction: Secondary | ICD-10-CM

## 2010-04-29 DIAGNOSIS — Z452 Encounter for adjustment and management of vascular access device: Secondary | ICD-10-CM

## 2010-04-29 DIAGNOSIS — C20 Malignant neoplasm of rectum: Secondary | ICD-10-CM

## 2010-04-29 LAB — CBC WITH DIFFERENTIAL/PLATELET
Eosinophils Absolute: 0.2 10*3/uL (ref 0.0–0.5)
LYMPH%: 6.3 % — ABNORMAL LOW (ref 14.0–49.0)
MCH: 33.1 pg (ref 27.2–33.4)
MCV: 97.5 fL (ref 79.3–98.0)
MONO%: 10.7 % (ref 0.0–14.0)
NEUT#: 3.4 10*3/uL (ref 1.5–6.5)
Platelets: 195 10*3/uL (ref 140–400)
RBC: 3.31 10*6/uL — ABNORMAL LOW (ref 4.20–5.82)

## 2010-04-29 LAB — COMPREHENSIVE METABOLIC PANEL
Alkaline Phosphatase: 54 U/L (ref 39–117)
BUN: 21 mg/dL (ref 6–23)
Glucose, Bld: 76 mg/dL (ref 70–99)
Sodium: 141 mEq/L (ref 135–145)
Total Bilirubin: 0.4 mg/dL (ref 0.3–1.2)
Total Protein: 5.9 g/dL — ABNORMAL LOW (ref 6.0–8.3)

## 2010-04-29 LAB — CEA: CEA: 0.8 ng/mL (ref 0.0–5.0)

## 2010-05-17 ENCOUNTER — Other Ambulatory Visit (HOSPITAL_COMMUNITY): Payer: BC Managed Care – PPO

## 2010-05-27 ENCOUNTER — Encounter (HOSPITAL_COMMUNITY): Payer: Self-pay

## 2010-05-27 ENCOUNTER — Encounter (HOSPITAL_COMMUNITY)
Admission: RE | Admit: 2010-05-27 | Discharge: 2010-05-27 | Disposition: A | Discharge status: Home / Self Care | Payer: BC Managed Care – PPO | Source: Ambulatory Visit | Attending: Oncology | Admitting: Oncology

## 2010-05-27 ENCOUNTER — Other Ambulatory Visit (HOSPITAL_COMMUNITY): Payer: Self-pay | Admitting: Oncology

## 2010-05-27 ENCOUNTER — Encounter (HOSPITAL_BASED_OUTPATIENT_CLINIC_OR_DEPARTMENT_OTHER): Payer: BC Managed Care – PPO | Admitting: Oncology

## 2010-05-27 DIAGNOSIS — C20 Malignant neoplasm of rectum: Secondary | ICD-10-CM | POA: Insufficient documentation

## 2010-05-27 DIAGNOSIS — Z933 Colostomy status: Secondary | ICD-10-CM | POA: Insufficient documentation

## 2010-05-27 DIAGNOSIS — C19 Malignant neoplasm of rectosigmoid junction: Secondary | ICD-10-CM

## 2010-05-27 DIAGNOSIS — K4020 Bilateral inguinal hernia, without obstruction or gangrene, not specified as recurrent: Secondary | ICD-10-CM | POA: Insufficient documentation

## 2010-05-27 LAB — CBC WITH DIFFERENTIAL/PLATELET
Basophils Absolute: 0 10*3/uL (ref 0.0–0.1)
Eosinophils Absolute: 0.2 10*3/uL (ref 0.0–0.5)
HCT: 35.2 % — ABNORMAL LOW (ref 38.4–49.9)
HGB: 12.2 g/dL — ABNORMAL LOW (ref 13.0–17.1)
MONO#: 0.4 10*3/uL (ref 0.1–0.9)
NEUT%: 80.2 % — ABNORMAL HIGH (ref 39.0–75.0)
WBC: 4.7 10*3/uL (ref 4.0–10.3)
lymph#: 0.4 10*3/uL — ABNORMAL LOW (ref 0.9–3.3)

## 2010-05-27 LAB — COMPREHENSIVE METABOLIC PANEL
BUN: 18 mg/dL (ref 6–23)
CO2: 24 mEq/L (ref 19–32)
Calcium: 8.9 mg/dL (ref 8.4–10.5)
Chloride: 109 mEq/L (ref 96–112)
Creatinine, Ser: 0.97 mg/dL (ref 0.40–1.50)

## 2010-05-27 LAB — LACTATE DEHYDROGENASE: LDH: 100 U/L (ref 94–250)

## 2010-05-27 MED ORDER — FLUDEOXYGLUCOSE F - 18 (FDG) INJECTION
19.5000 | Freq: Once | INTRAVENOUS | Status: AC | PRN
  Administered 2010-05-27: 19.5 via INTRAVENOUS

## 2010-05-28 ENCOUNTER — Encounter (HOSPITAL_BASED_OUTPATIENT_CLINIC_OR_DEPARTMENT_OTHER): Payer: BC Managed Care – PPO | Admitting: Oncology

## 2010-05-28 DIAGNOSIS — C19 Malignant neoplasm of rectosigmoid junction: Secondary | ICD-10-CM

## 2010-05-28 LAB — GLUCOSE, CAPILLARY: Glucose-Capillary: 78 mg/dL (ref 70–99)

## 2010-06-21 NOTE — Op Note (Signed)
NAME:  Eric Munoz, Eric Munoz NO.:  [id number]   MEDICAL RECORD NO.:  [id number]          PATIENT TYPE:  INP   LOCATION:  5729                         FACILITY:  [facility]   PHYSICIAN:  Gabrielle Dare. Janee Morn, M.D.DATE OF BIRTH:  07-14-1940   DATE OF PROCEDURE:  03/15/2004  DATE OF DISCHARGE:                                 OPERATIVE REPORT   PREOPERATIVE DIAGNOSIS:  1.  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  2.  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   POSTOPERATIVE DIAGNOSIS:  1.  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  2.  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   PROCEDURE:  1.  Transanal resection of rectal polyp.  2.  Sigmoid colectomy.   SURGEON:  Gabrielle Dare. Janee Morn, M.D.   ASSISTANT:  Gita Kudo, M.D.   ANESTHESIA:  General.   HISTORY OF PRESENT ILLNESS:  The patient is a 70 year old white male who  underwent colonoscopy by Dr. Sherin Quarry.  During that colonoscopy, a  pedunculated polyp was taken out of the mid sigmoid colon and a sessile  adenomatous polyp was biopsied from the rectum.  The sigmoid colon lesion  showed invasive carcinoma that extended into the stalk but the margins were  negative.  The rectal polyp was a villous adenoma.  The patient now presents  for transanal excision of the rectal polyp and sigmoid colectomy.   PROCEDURE IN DETAIL:  Informed consent was obtained.  The patient was  identified and received intravenous antibiotics.  He was brought to the  operating room and general anesthesia was administered.  Attention was  turned first to the rectal polyp.  He was placed in the lithotomy position.  His perianal region was prepped and draped in a sterile fashion.  Digital  rectal exam easily identified this approximately 2 cm sessile polyp in the  posterolateral aspect of his rectum towards the left side.  Anal retractor  was inserted.  A stitch of 3-0 Vicryl was placed 1.5 cm proximal to  the  polyp.  0.5% Marcaine with epinephrine was injected submucosally to raise  the polyp surface and then it was excised.  An attempt was made to leave a  nice margin of normal mucosa.  The polyp, however, was very friable.  The  entire polyp was excised but two pieces did break off during grasping the  polyp.  It was stitched for orientation and sent to pathology.  A 2-0 Vicryl  suture was then used to close the resulting mucosal defect in a running  fashion.  Once that was accomplished, hemostasis was insured.  The area was  irrigated and a Gelfoam roll was placed into the rectal area.  At this time,  a sterile dressing was applied.   Attention was turned to the abdominal portion of the operation.  He was  taken out of lithotomy and placed supine.  His abdomen was prepped and  draped in a sterile fashion.  A lower midline incision was made, the  subcutaneous tissues were dissected down revealing the anterior  fascia which  was divided sharply.  The peritoneal cavity was entered under direct vision  without difficulty.  The patient's bladder was quite large and we avoided  damaging this at the inferior portion of the incision.  Once the fascia was  opened the length of  the skin incision, a Balfour retractor with bladder  blade and extender were inserted.  Exploration of the abdomen revealed no  palpable liver masses.  The NG tube was adjusted to the proper position.  The small bowel was then packed out of the way.  The sigmoid colon was  inspected.  There were several diverticula present.  No obvious palpable  mass was noted and was not expected to have any palpable mass.  At this  time, the sigmoid colon was mobilized on its mesentery up towards the  descending colon.  During this mobilization, we carefully dissected out the  left ureter to keep it away from our field.  We also took care to sweep the  gonadal vessels down so they were not damaged.  We then selected the mid  point of  the sigmoid colon that was planned to resect.  As the polypectomy  site was not tattooed, a small colotomy was made and using alternating bowel  clamps, a rigid proctoscope was inserted to look for the polypectomy site.  It was found about 1.5 cm proximal to our colotomy.  We then planned our  resection approximately 6 cm proximal to this and going to about 8 cm  distal.  The sigmoid was divided at the proximal selective locations with a  GI-75 stapler.  We then did a large mesenteric dissection taking down the  mesentery between [facility], dividing it, and tying it securely.  We kept the  ureter protected and continued this along, dividing the mesentery down to  our distal point of resection.  At this point, once the bowel was cleared  off and the mesentery was hemostatic, a Satinsky bowel clamp was placed  distally.  We put stay sutures in the medial and lateral sides of the distal  colon and the colon specimen was divided below the Satinsky clamp and taken  to the back table.  We then put a stitch to mark the distal margin and then  opened the specimen extending from our initial colotomy and, as expected,  1.5 cm proximal to our initial colotomy, there was a polypectomy scar, this  was marked on the mucosal surface with a suture and pathology was  instructed.  The specimen was sent to pathology.  At this time, stay sutures  were placed in the proximal end of the descending colon.  It was mobilized  slightly more from the lateral retroperitoneal attachment but laid nicely  with no tension down to our distal colon.  Subsequently, the staple line was  cut out and we performed a handsewn anastomosis with 2-0 silk.  This was  done along the posterior wall with alternating mattress and interrupted  sutures and along the anterior wall with alternating J&B sutures and  interrupted sutures of 2-0 silk.  Once this was accomplished, one additional stitch was placed to reinforce the anastomosis.  A nice  patent anastomosis  was present.  The nonpenetrating bowel clamps were removed.  The area was  irrigated.  The gap in the mesentery was closed with two figure-of-eight 2-0  silk sutures to prevent internal hernia.  We then changed our gloves and  glove and gown were also changed after opening the  specimen.  The abdomen  was copiously irrigated.  The NG tube was rechecked.  Warm saline irrigation  was used, this all returned clear.  The bowel was returned to its anatomic  position, the omentum was free.  The abdomen was then closed by closing the  fascia with running #1 PDS from either end of the incision and tying in the  middle.  The peritoneum was incorporated in this closure.  The subcutaneous  tissues were copiously irrigated and the skin was closed with staples.  Sponge, needle, and instrument counts were correct.  Benzoin, Steri-Strips,  and sterile dressings were applied.  The patient tolerated the procedure  well without apparent complications.  He was taken to the recovery room in  stable condition.      BET/MEDQ  D:  03/15/2004  T:  03/15/2004  Job:  [id number]

## 2010-06-21 NOTE — Discharge Summary (Signed)
NAME:  Eric Munoz, Eric Munoz NO.:  [id number]   MEDICAL RECORD NO.:  [id number]          PATIENT TYPE:  INP   LOCATION:  5729                         FACILITY:  [facility]   PHYSICIAN:  Gabrielle Dare. Janee Morn, M.D.DATE OF BIRTH:  21-Dec-1940   DATE OF ADMISSION:  03/15/2004  DATE OF DISCHARGE:  03/20/2004                                 DISCHARGE SUMMARY   DISCHARGE DIAGNOSIS:  Status post rectal polypectomy and sigmoid colon  resection.   HISTORY OF PRESENT ILLNESS:  The patient is a 70 year old male who underwent  colonoscopy by Dr. Sherin Quarry.  During that time, a pedunculated polyp was  taken out of the mid-sigmoid and sessile adenomatous polyp was biopsied from  the rectum.  The sigmoid colon lesion showed an invasive carcinoma at the  extended end of the stalk, but the margins were negative.  The rectal polyp  was a villous adenoma.  The patient presented to the hospital for transanal  excision of rectal polyp and a sigmoid colectomy.   HOSPITAL COURSE:  The patient underwent an uncomplicated transanal resection  of rectal polyp and sigmoid colectomy.  Postoperatively, he remained  hemodynamically stable; he was also afebrile.  He has developed some mild  right upper extremity thrombophlebitis that was treated with warm compresses  and aspirin; in addition, his wound had developed some mild erythema; he was  treated with cefotetan intravenously.  The erythema resolved by  postoperative day 4.  His postoperative ileus gradually improved.  He  tolerated gradual slow advancement of his diet and on March 20, 2004, he  was tolerating a diet.  His incision was clean and dry with no erythema and  he was discharged home in stable condition.   DISCHARGE DIET:  Regular.   DISCHARGE ACTIVITY:  No lifting.   DISCHARGE MEDICATIONS:  1.  Augmentin 875 mg one p.o. b.i.d. x5 days.  2.  Percocet 5/325 one to two p.o. q.6 h. p.r.n. pain.   FOLLOWUP:  The patient is to follow up in 1  week for staple removal      BET/MEDQ  D:  05/16/2004  T:  05/16/2004  Job:  [id number]

## 2010-07-25 ENCOUNTER — Other Ambulatory Visit (HOSPITAL_COMMUNITY): Payer: Self-pay | Admitting: Oncology

## 2010-07-25 ENCOUNTER — Encounter (HOSPITAL_BASED_OUTPATIENT_CLINIC_OR_DEPARTMENT_OTHER): Payer: BC Managed Care – PPO | Admitting: Oncology

## 2010-07-25 DIAGNOSIS — Z5111 Encounter for antineoplastic chemotherapy: Secondary | ICD-10-CM

## 2010-07-25 DIAGNOSIS — C19 Malignant neoplasm of rectosigmoid junction: Secondary | ICD-10-CM

## 2010-07-25 LAB — COMPREHENSIVE METABOLIC PANEL
ALT: 14 U/L (ref 0–53)
AST: 16 U/L (ref 0–37)
Alkaline Phosphatase: 50 U/L (ref 39–117)
CO2: 26 mEq/L (ref 19–32)
Sodium: 143 mEq/L (ref 135–145)
Total Bilirubin: 0.4 mg/dL (ref 0.3–1.2)
Total Protein: 6 g/dL (ref 6.0–8.3)

## 2010-07-25 LAB — CBC WITH DIFFERENTIAL/PLATELET
BASO%: 0.3 % (ref 0.0–2.0)
EOS%: 1.8 % (ref 0.0–7.0)
LYMPH%: 9.9 % — ABNORMAL LOW (ref 14.0–49.0)
MCH: 31.9 pg (ref 27.2–33.4)
MCHC: 34.6 g/dL (ref 32.0–36.0)
MONO#: 0.3 10*3/uL (ref 0.1–0.9)
Platelets: 172 10*3/uL (ref 140–400)
RBC: 3.79 10*6/uL — ABNORMAL LOW (ref 4.20–5.82)
WBC: 3.9 10*3/uL — ABNORMAL LOW (ref 4.0–10.3)

## 2010-07-25 LAB — LACTATE DEHYDROGENASE: LDH: 105 U/L (ref 94–250)

## 2010-09-13 ENCOUNTER — Ambulatory Visit (HOSPITAL_BASED_OUTPATIENT_CLINIC_OR_DEPARTMENT_OTHER)
Admission: RE | Admit: 2010-09-13 | Discharge: 2010-09-13 | Disposition: A | Discharge status: Home / Self Care | Payer: BC Managed Care – PPO | Source: Ambulatory Visit | Attending: Urology | Admitting: Urology

## 2010-09-13 DIAGNOSIS — N1330 Unspecified hydronephrosis: Secondary | ICD-10-CM | POA: Insufficient documentation

## 2010-09-13 DIAGNOSIS — N135 Crossing vessel and stricture of ureter without hydronephrosis: Secondary | ICD-10-CM | POA: Insufficient documentation

## 2010-09-22 NOTE — Op Note (Signed)
  NAME:  Eric Munoz, Eric Munoz NO.:  [id number]  MEDICAL RECORD NO.:  [id number]  LOCATION:                                 FACILITY:  PHYSICIAN:  Bertram Millard. Kaiser Belluomini, M.D.DATE OF BIRTH:  13-Oct-1940  DATE OF PROCEDURE:  09/13/2010 DATE OF DISCHARGE:                              OPERATIVE REPORT   PREOPERATIVE DIAGNOSIS:  Left ureteral stricture with hydronephrosis, with stent in place.  POSTOPERATIVE DIAGNOSIS:  Left ureteral stricture with hydronephrosis, with stent in place.  PRINCIPAL PROCEDURE:  Cystoscopy, left double-J stent removal, left retrograde ureteropyelogram, left double-J stent placement (7 x 24 cm contour without string).  ANESTHESIA:  General with LMA.  COMPLICATIONS:  None.  BRIEF HISTORY:  [name] with recurrent adenocarcinoma of the colon.  He underwent reexcision of a large retroperitoneal mass last summer in July.  He has had an indwelling stent, as he had significant ureteral involvement with this surgical procedure in July 2011.  It is not felt that he can continue without a stent due to his stricture.  He presents at this time for double-J stent exchange.  DESCRIPTION OF PROCEDURE:  The patient was identified in the holding area and received preoperative IV antibiotics.  He was taken to the operating room where general anesthetic was administered using LMA.  He was placed in the dorsal lithotomy position.  Genitalia and perineum were prepped and draped.  Time-out was then performed.  The procedure then commenced.  A 22-French panendoscope was advanced through his urethra, which was normal.  Prostate was nonobstructive. Bladder was then inspected circumferentially.  No tumors, trabeculations, or foreign bodies.  The left ureteral stent was grasped at the orifice and brought out through the penile urethra.  A guidewire was placed through this, and then up through the ureter and into the renal pelvis where fluoroscopically a  curl was seen.  I then placed a 6- [place] open-ended catheter in the left ureteral orifice and performed a retrograde ureteropyelogram.  The proximal and distal ureter was normal.  However, the mid ureter overlying the sacrum showed significant medial deviation of the ureter with a very narrowed ureteral lumen.  It was not felt that this could tolerate doing without a stent.  At this point, I replaced a 7-French 24 cm contour stent.  Good curls were seen proximally and distally once the guidewire was removed.  The bladder was drained.  The procedure terminated.  The patient tolerated the procedure well.  He was awakened and taken to PACU in stable condition.     Bertram Millard. [name], M.D.     SMD/MEDQ  D:  09/13/2010  T:  09/14/2010  Job:  [id number]  Electronically Signed by [name] M.D. on 09/22/2010 01:31:36 PM

## 2010-09-24 ENCOUNTER — Other Ambulatory Visit (HOSPITAL_COMMUNITY): Payer: Self-pay | Admitting: Oncology

## 2010-09-24 ENCOUNTER — Encounter (HOSPITAL_BASED_OUTPATIENT_CLINIC_OR_DEPARTMENT_OTHER): Payer: BC Managed Care – PPO | Admitting: Oncology

## 2010-09-24 DIAGNOSIS — C19 Malignant neoplasm of rectosigmoid junction: Secondary | ICD-10-CM

## 2010-09-24 DIAGNOSIS — C189 Malignant neoplasm of colon, unspecified: Secondary | ICD-10-CM

## 2010-09-24 LAB — CBC WITH DIFFERENTIAL/PLATELET
BASO%: 0.1 % (ref 0.0–2.0)
Basophils Absolute: 0 10*3/uL (ref 0.0–0.1)
Eosinophils Absolute: 0.1 10*3/uL (ref 0.0–0.5)
HCT: 36.5 % — ABNORMAL LOW (ref 38.4–49.9)
HGB: 12.9 g/dL — ABNORMAL LOW (ref 13.0–17.1)
LYMPH%: 9.5 % — ABNORMAL LOW (ref 14.0–49.0)
MCV: 92.2 fL (ref 79.3–98.0)
MONO#: 0.3 10*3/uL (ref 0.1–0.9)
MONO%: 7 % (ref 0.0–14.0)
NEUT#: 3.1 10*3/uL (ref 1.5–6.5)
NEUT%: 81.1 % — ABNORMAL HIGH (ref 39.0–75.0)
Platelets: 185 10*3/uL (ref 140–400)
WBC: 3.9 10*3/uL — ABNORMAL LOW (ref 4.0–10.3)

## 2010-09-24 LAB — COMPREHENSIVE METABOLIC PANEL
ALT: 14 U/L (ref 0–53)
Albumin: 4.2 g/dL (ref 3.5–5.2)
Alkaline Phosphatase: 47 U/L (ref 39–117)
CO2: 27 mEq/L (ref 19–32)
Glucose, Bld: 78 mg/dL (ref 70–99)
Potassium: 4.3 mEq/L (ref 3.5–5.3)
Sodium: 143 mEq/L (ref 135–145)
Total Bilirubin: 0.5 mg/dL (ref 0.3–1.2)
Total Protein: 6.1 g/dL (ref 6.0–8.3)

## 2010-09-27 ENCOUNTER — Ambulatory Visit
Admission: RE | Admit: 2010-09-27 | Discharge: 2010-09-27 | Disposition: A | Discharge status: Home / Self Care | Payer: BC Managed Care – PPO | Source: Ambulatory Visit | Attending: Radiation Oncology | Admitting: Radiation Oncology

## 2010-11-04 ENCOUNTER — Ambulatory Visit (INDEPENDENT_AMBULATORY_CARE_PROVIDER_SITE_OTHER): Payer: BC Managed Care – PPO | Admitting: Surgery

## 2010-11-04 ENCOUNTER — Encounter (INDEPENDENT_AMBULATORY_CARE_PROVIDER_SITE_OTHER): Payer: Self-pay | Admitting: Surgery

## 2010-11-04 VITALS — BP 204/90 | HR 72 | Temp 96.4°F | Resp 20 | Ht 70.0 in | Wt 161.5 lb

## 2010-11-04 DIAGNOSIS — Z86010 Personal history of colonic polyps: Secondary | ICD-10-CM

## 2010-11-04 DIAGNOSIS — N135 Crossing vessel and stricture of ureter without hydronephrosis: Secondary | ICD-10-CM | POA: Insufficient documentation

## 2010-11-04 DIAGNOSIS — Z85038 Personal history of other malignant neoplasm of large intestine: Secondary | ICD-10-CM

## 2010-11-04 NOTE — Progress Notes (Signed)
Subjective:     Patient ID: Eric Munoz, male   DOB: 12/05/1940, 70 y.o.   MRN: [id number]  HPI  Patient Care Team: [name] as PCP - General (Family Medicine) [name] as Consulting Physician (Urology) [name] as Consulting Physician (Radiation Oncology) [name] as Consulting Physician (Gastroenterology) [name] as Consulting Physician (Gastroenterology) [name] as Consulting Physician (Hematology and Oncology)  This patient is a 70 y.o.male who presents today for surgical evaluation.     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 He had polyps in his colon and rectum resected in 2007. He was lost to followup. He returned with a large cancer in his left colon and recurrent polyp in his rectum. He underwent resection July 2012. Because there was obvious inflammation and involvement to numerous contiguous structures, I placed an end colostomy. His margin was positive.  Since that time, he is undergone radiation therapy and chemotherapy under the cares of radiation and medical oncology. He a followup colonoscopy July 2012 but showed no other abnormalities. His exercise tolerance is good. He had some right leg weakness the first few months after surgery but that is all resolved. His weight has been stable. He empties his colostomy 1-3 times a day which has been stable. No complaints of urinary incontinence. No difficulty with erection or ejaculation.  He developed a ureteral stricture. [facility] has been changing his stents out. The last time was in July. He recalls being told followup stent replacement in January  2013.  He comes today with his wife's in the hopes that the colostomy could be taken down. He has no evidence of any active disease. Last PET scan in April 2012 showed no recurrence.    Past Medical History  Diagnosis Date  . Easy bruising   . Colon cancer     Stage 4, [id number]  . Adenomatous rectal polyp 2007, recurrent [id number]    Past Surgical History  Procedure Date  . Colon surgery 08/07/09    History   Social History  . Marital Status: Married    Spouse Name: N/A    Number of Children: N/A  . Years of Education: N/A   Occupational History  . Not on file.   Social History Main Topics  . Smoking status: Former Games developer  . Smokeless tobacco: Never Used  . Alcohol Use: No  . Drug Use: No  . Sexually Active:    Other Topics Concern  . Not on file   Social History Narrative  . No narrative on file    Family History  Problem Relation Age of Onset  . Stroke Father 107    Current outpatient prescriptions:Multiple Vitamin (MULTIVITAMIN PO), Take by mouth daily.  , Disp: , Rfl:   Not on File     Review of Systems  Constitutional: Negative for fever, chills and diaphoresis.  HENT: Negative for nosebleeds, sore throat, facial swelling, mouth sores, trouble swallowing and ear discharge.   Eyes: Negative for photophobia, discharge and visual disturbance.  Respiratory: Negative for choking, chest tightness, shortness of breath and stridor.   Cardiovascular: Negative for chest pain and palpitations.  Gastrointestinal: Negative for nausea, vomiting, abdominal pain, diarrhea, constipation, blood  in stool, abdominal distention, anal bleeding and rectal pain.  Genitourinary: Negative for dysuria, urgency, difficulty urinating and testicular pain.  Musculoskeletal: Negative for myalgias, back pain, arthralgias and gait problem.  Skin: Negative for color change, pallor, rash and wound.  Neurological: Negative for dizziness, speech difficulty, weakness, numbness and  headaches.  Hematological: Negative for adenopathy. Does not bruise/bleed easily.  Psychiatric/Behavioral: Negative for hallucinations, confusion and agitation.       Objective:   Physical Exam  Constitutional: He is oriented to person, place, and time. He appears well-developed and well-nourished. No distress.  HENT:  Head: Normocephalic.  Mouth/Throat: Oropharynx is clear and moist. No oropharyngeal exudate.  Eyes: Conjunctivae and EOM are normal. Pupils are equal, round, and reactive to light. No scleral icterus.  Neck: Normal range of motion. Neck supple. No tracheal deviation present.  Cardiovascular: Normal rate, regular rhythm and intact distal pulses.   Pulmonary/Chest: Effort normal and breath sounds normal. No respiratory distress.  Abdominal: Soft. He exhibits no distension and no mass. There is no tenderness. There is no rebound and no guarding. Hernia confirmed negative in the right inguinal area and confirmed negative in the left inguinal area.       L mid colostomy intact  Genitourinary: Penis normal. No penile tenderness.       Rectal stump (anal canal) 2cm above sphincters.  Mildly decreased sphincter tone but no defects.  Musculoskeletal: Normal range of motion. He exhibits no tenderness.  Lymphadenopathy:    He has no cervical adenopathy.       Right: No inguinal adenopathy present.       Left: No inguinal adenopathy present.  Neurological: He is alert and oriented to person, place, and time. No cranial nerve deficit. He exhibits normal muscle tone. Coordination normal.  Skin: Skin is warm and dry. No rash noted. He is not diaphoretic. No erythema. No pallor.  Psychiatric: He has a normal mood and affect. His behavior is normal. Judgment and thought content normal.       Assessment:     Stage IV colon cancer stable 7.5 months from completion of chemotherapy and radiation therapy. Functioning well.  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 If he has no evidence of recurrence in the PET scan next month, I think it is reasonable to consider colostomy takedown. It is reasonable to start laparoscopically with a possibility of converting to open. I did caution him that he may have evidence of carcinomatosis or cancer recurrence intraoperatively that no other study will see that would make me have to abort the procedure.  I would like to get input from urology to see if they feel anything more aggressive needs to be done about the left ureteral stricture. My sense is that they and the patient would rather do just stent exchanges for now especially if getting up every 6 months or longer.  They do understand is a possibility of his local recurrence that something to come back requiring another operation with a permanent colostomy or further problems down the road. That is hard to foresee. They wish to be aggressive with a colostomy takedown if possible. It would be about 10 months since completion of all therapy. It'll be well over a year since surgery. Think is reasonable to consider it at that time.   The anatomy & physiology of the digestive tract was discussed.  The  pathophysiology was discussed.  Natural history risks without surgery was discussed.   I feel the risks of no intervention will lead to serious problems that outweigh the operative risks; therefore, I offered ostomy takedown.  Laparoscopic & open techniques were discussed.   Risks such as bleeding, infection, abscess, leak, reoperation, possible redo or new ostomy, hernia, heart attack, death, and other risks were discussed.  Goals of post-operative recovery were discussed as well.  We will work to minimize complications.  An educational handout on the pathology was given as well.  Questions were answered.  The patient expresses understanding & wishes to proceed with surgery.

## 2010-11-04 NOTE — Patient Instructions (Signed)
Colostomy, Reversal Care After Read the instructions outlined below and refer to this sheet in the next few weeks. These discharge instructions provide you with general information on caring for yourself after you leave the hospital. Your doctor may also give you specific instructions. While your treatment has been planned according to the most current medical practices available, unavoidable complications occasionally occur. If you have any problems or questions after discharge, call your doctor. HOME CARE INSTRUCTIONS AFTER YOUR SURGERY   Once you are home, an ice pack applied to the operative site may help with discomfort and keep swelling down.   Change dressings as directed.   Keep the wound dry and clean. The wound may be washed gently with soap and water. Do not rub while cleansing. Gently blot or dab dry. Do not take baths, use swimming pools or use hot tubs for 10 days, or as instructed by your caregiver.   Only take over-the-counter or prescription medicines for pain, discomfort, or fever as directed by your caregiver.   You may continue normal diet or as directed.   There should be no heavy lifting (more than 10 pounds / 4.5 kilos) or contact sports for 3 weeks, or as directed.   Use stool softener if recommended by your caregiver. For children, follow your caregivers' directions.  SEEK MEDICAL CARE IF:  There is redness, swelling, or increasing pain in the wound.   There is pus coming from the wound.   There is drainage from a wound lasting longer than 1 day.   An unexplained oral temperature above temp03 develops.   You notice a foul smell coming from the wound or dressing.   The stitch line of the wound breaks open (edges are not staying together) after stitches have been removed.   You develop persistent nausea or vomiting.   You develop pain that is not controlled with medicine.  SEEK IMMEDIATE MEDICAL CARE IF:  You develop a rash.   You have difficulty  breathing, or develop any reaction or side effects to medicine given.  Document Released: 12/07/2003 Document Re-Released: 07/10/2009 [facility] Patient Information 2011 [place], [place].

## 2010-11-29 ENCOUNTER — Other Ambulatory Visit (HOSPITAL_COMMUNITY): Payer: Self-pay | Admitting: Oncology

## 2010-11-29 ENCOUNTER — Encounter (HOSPITAL_COMMUNITY)
Admission: RE | Admit: 2010-11-29 | Discharge: 2010-11-29 | Disposition: A | Discharge status: Home / Self Care | Payer: BC Managed Care – PPO | Source: Ambulatory Visit | Attending: Oncology | Admitting: Oncology

## 2010-11-29 ENCOUNTER — Encounter (HOSPITAL_BASED_OUTPATIENT_CLINIC_OR_DEPARTMENT_OTHER): Payer: BC Managed Care – PPO | Admitting: Oncology

## 2010-11-29 DIAGNOSIS — C19 Malignant neoplasm of rectosigmoid junction: Secondary | ICD-10-CM

## 2010-11-29 DIAGNOSIS — C189 Malignant neoplasm of colon, unspecified: Secondary | ICD-10-CM | POA: Insufficient documentation

## 2010-11-29 LAB — COMPREHENSIVE METABOLIC PANEL
Albumin: 4.5 g/dL (ref 3.5–5.2)
BUN: 22 mg/dL (ref 6–23)
CO2: 27 mEq/L (ref 19–32)
Calcium: 9.7 mg/dL (ref 8.4–10.5)
Chloride: 108 mEq/L (ref 96–112)
Glucose, Bld: 82 mg/dL (ref 70–99)
Potassium: 4.4 mEq/L (ref 3.5–5.3)
Sodium: 143 mEq/L (ref 135–145)
Total Protein: 6.4 g/dL (ref 6.0–8.3)

## 2010-11-29 LAB — CBC WITH DIFFERENTIAL/PLATELET
Basophils Absolute: 0 10*3/uL (ref 0.0–0.1)
Eosinophils Absolute: 0.1 10*3/uL (ref 0.0–0.5)
HCT: 40.4 % (ref 38.4–49.9)
HGB: 13.9 g/dL (ref 13.0–17.1)
MCH: 32.1 pg (ref 27.2–33.4)
MONO#: 0.3 10*3/uL (ref 0.1–0.9)
NEUT#: 2.7 10*3/uL (ref 1.5–6.5)
NEUT%: 77.3 % — ABNORMAL HIGH (ref 39.0–75.0)
RDW: 13.9 % (ref 11.0–14.6)
WBC: 3.5 10*3/uL — ABNORMAL LOW (ref 4.0–10.3)
lymph#: 0.4 10*3/uL — ABNORMAL LOW (ref 0.9–3.3)

## 2010-11-29 LAB — CEA: CEA: 0.9 ng/mL (ref 0.0–5.0)

## 2010-11-29 MED ORDER — FLUDEOXYGLUCOSE F - 18 (FDG) INJECTION
16.1000 | Freq: Once | INTRAVENOUS | Status: AC | PRN
  Administered 2010-11-29: 16.1 via INTRAVENOUS

## 2010-12-03 ENCOUNTER — Encounter (HOSPITAL_BASED_OUTPATIENT_CLINIC_OR_DEPARTMENT_OTHER): Payer: BC Managed Care – PPO | Admitting: Oncology

## 2010-12-03 DIAGNOSIS — C19 Malignant neoplasm of rectosigmoid junction: Secondary | ICD-10-CM

## 2010-12-03 DIAGNOSIS — N135 Crossing vessel and stricture of ureter without hydronephrosis: Secondary | ICD-10-CM

## 2010-12-05 ENCOUNTER — Telehealth (INDEPENDENT_AMBULATORY_CARE_PROVIDER_SITE_OTHER): Payer: Self-pay

## 2010-12-05 DIAGNOSIS — Z87448 Personal history of other diseases of urinary system: Secondary | ICD-10-CM

## 2010-12-05 HISTORY — DX: Personal history of other diseases of urinary system: Z87.448

## 2010-12-05 NOTE — Telephone Encounter (Signed)
Returned pt's voicemail about removing the PAC at the same time of his colon sx on 12-20-10. I paged Dr Michaell Cowing about the [facility] removal request and he said he will remove PAC at the same time as long as there is no complications or signs of recurrance of colon ca. The pt understands the info./ [facility]

## 2010-12-09 ENCOUNTER — Encounter (HOSPITAL_COMMUNITY): Payer: Self-pay | Admitting: Pharmacy Technician

## 2010-12-11 ENCOUNTER — Encounter (HOSPITAL_COMMUNITY): Payer: BC Managed Care – PPO

## 2010-12-11 ENCOUNTER — Encounter (HOSPITAL_COMMUNITY): Payer: Self-pay

## 2010-12-11 LAB — CBC
Hemoglobin: 13.1 g/dL (ref 13.0–17.0)
MCH: 31.1 pg (ref 26.0–34.0)
MCHC: 33.8 g/dL (ref 30.0–36.0)
MCV: 92.2 fL (ref 78.0–100.0)
Platelets: 194 10*3/uL (ref 150–400)
RBC: 4.21 MIL/uL — ABNORMAL LOW (ref 4.22–5.81)

## 2010-12-11 LAB — SURGICAL PCR SCREEN: MRSA, PCR: NEGATIVE

## 2010-12-11 LAB — COMPREHENSIVE METABOLIC PANEL
ALT: 19 U/L (ref 0–53)
CO2: 28 mEq/L (ref 19–32)
Calcium: 10.4 mg/dL (ref 8.4–10.5)
Creatinine, Ser: 0.97 mg/dL (ref 0.50–1.35)
GFR calc Af Amer: >90 mL/min (ref >90)
GFR calc non Af Amer: 82 mL/min — ABNORMAL LOW (ref >90)
Glucose, Bld: 83 mg/dL (ref 70–99)
Sodium: 140 mEq/L (ref 135–145)
Total Protein: 6.6 g/dL (ref 6.0–8.3)

## 2010-12-11 NOTE — Patient Instructions (Addendum)
20 Eric Munoz  12/11/2010   Your procedure is scheduled on:  12-20-10  Report to [facility] at 0930 AM.  Call this number if you have problems the morning of surgery: [phone]   Remember:   Do not eat food:After Midnight.  Do not drink clear liquids: After Midnight.  Take these medicines the morning of surgery with A SIP OF WATER: none   Do not wear jewelry, make-up or nail polish.  Do not wear lotions, powders, or perfumes. You may wear deodorant.  Do not shave 48 hours prior to surgery.  Do not bring valuables to the hospital.  Contacts, dentures or bridgework may not be worn into surgery.  Leave suitcase in the car. After surgery it may be brought to your room.  For patients admitted to the hospital, checkout time is 11:00 AM the day of discharge.   Patients discharged the day of surgery will not be allowed to drive home.  Name and phone number of your driver:  [name] QMVH,QIONGE-[phone]               Special Instructions: CHG Shower Use Special Wash: 1/2 bottle night before surgery and 1/2 bottle morning of surgery.   Please read over the following fact sheets that you were given: MRSA Information

## 2010-12-12 ENCOUNTER — Encounter (HOSPITAL_COMMUNITY): Payer: Self-pay

## 2010-12-12 NOTE — Pre-Procedure Instructions (Signed)
12-11-10 Ekg(03-01-10) report with chart/ 1-view CXR(09-17-09) report with chart. PET scan report skull to thigh(11-29-10) with chart.

## 2010-12-17 NOTE — Pre-Procedure Instructions (Signed)
12-17-10-Spoke with [facility] office to inquire-Pt. States present Port-a-cath to be removed also 12-20-10.

## 2010-12-18 ENCOUNTER — Other Ambulatory Visit (INDEPENDENT_AMBULATORY_CARE_PROVIDER_SITE_OTHER): Payer: Self-pay | Admitting: Surgery

## 2010-12-19 MED ORDER — SODIUM CHLORIDE 0.9 % IV SOLN
1.0000 g | INTRAVENOUS | Status: DC
  Filled 2010-12-19: qty 1

## 2010-12-20 ENCOUNTER — Inpatient Hospital Stay (HOSPITAL_COMMUNITY): Payer: BC Managed Care – PPO

## 2010-12-20 ENCOUNTER — Inpatient Hospital Stay (HOSPITAL_COMMUNITY): Payer: BC Managed Care – PPO | Admitting: Anesthesiology

## 2010-12-20 ENCOUNTER — Inpatient Hospital Stay (HOSPITAL_COMMUNITY)
Admission: RE | Admit: 2010-12-20 | Discharge: 2010-12-26 | DRG: 585 | Disposition: A | Discharge status: Home / Self Care | Payer: BC Managed Care – PPO | Source: Ambulatory Visit | Attending: Surgery | Admitting: Surgery

## 2010-12-20 ENCOUNTER — Encounter (HOSPITAL_COMMUNITY)
Admission: RE | Disposition: A | Discharge status: Home / Self Care | Payer: Self-pay | Source: Ambulatory Visit | Attending: Surgery

## 2010-12-20 ENCOUNTER — Other Ambulatory Visit (INDEPENDENT_AMBULATORY_CARE_PROVIDER_SITE_OTHER): Payer: Self-pay | Admitting: Surgery

## 2010-12-20 ENCOUNTER — Encounter (HOSPITAL_COMMUNITY): Payer: Self-pay | Admitting: Anesthesiology

## 2010-12-20 ENCOUNTER — Other Ambulatory Visit (HOSPITAL_COMMUNITY): Payer: BC Managed Care – PPO

## 2010-12-20 ENCOUNTER — Encounter (HOSPITAL_COMMUNITY): Payer: Self-pay | Admitting: *Deleted

## 2010-12-20 DIAGNOSIS — Z452 Encounter for adjustment and management of vascular access device: Secondary | ICD-10-CM

## 2010-12-20 DIAGNOSIS — D62 Acute posthemorrhagic anemia: Secondary | ICD-10-CM | POA: Diagnosis not present

## 2010-12-20 DIAGNOSIS — Z433 Encounter for attention to colostomy: Secondary | ICD-10-CM

## 2010-12-20 DIAGNOSIS — K660 Peritoneal adhesions (postprocedural) (postinfection): Secondary | ICD-10-CM | POA: Diagnosis present

## 2010-12-20 DIAGNOSIS — R1013 Epigastric pain: Secondary | ICD-10-CM | POA: Diagnosis present

## 2010-12-20 DIAGNOSIS — Z923 Personal history of irradiation: Secondary | ICD-10-CM

## 2010-12-20 DIAGNOSIS — S3730XA Unspecified injury of urethra, initial encounter: Secondary | ICD-10-CM

## 2010-12-20 DIAGNOSIS — S3720XA Unspecified injury of bladder, initial encounter: Secondary | ICD-10-CM

## 2010-12-20 DIAGNOSIS — Z860100 Personal history of colon polyps, unspecified: Secondary | ICD-10-CM

## 2010-12-20 DIAGNOSIS — N179 Acute kidney failure, unspecified: Secondary | ICD-10-CM | POA: Diagnosis not present

## 2010-12-20 DIAGNOSIS — Z932 Ileostomy status: Secondary | ICD-10-CM

## 2010-12-20 DIAGNOSIS — IMO0002 Reserved for concepts with insufficient information to code with codable children: Secondary | ICD-10-CM | POA: Diagnosis not present

## 2010-12-20 DIAGNOSIS — Z85038 Personal history of other malignant neoplasm of large intestine: Secondary | ICD-10-CM | POA: Diagnosis present

## 2010-12-20 DIAGNOSIS — Z860101 Personal history of adenomatous and serrated colon polyps: Secondary | ICD-10-CM | POA: Diagnosis present

## 2010-12-20 DIAGNOSIS — Z9221 Personal history of antineoplastic chemotherapy: Secondary | ICD-10-CM

## 2010-12-20 DIAGNOSIS — N135 Crossing vessel and stricture of ureter without hydronephrosis: Secondary | ICD-10-CM | POA: Diagnosis present

## 2010-12-20 DIAGNOSIS — Z9049 Acquired absence of other specified parts of digestive tract: Secondary | ICD-10-CM

## 2010-12-20 HISTORY — PX: COLOSTOMY TAKEDOWN: SHX5258

## 2010-12-20 HISTORY — PX: PORT-A-CATH REMOVAL: SHX5289

## 2010-12-20 LAB — CBC
MCH: 31.6 pg (ref 26.0–34.0)
MCHC: 34.5 g/dL (ref 30.0–36.0)
Platelets: 163 10*3/uL (ref 150–400)
RBC: 3.23 MIL/uL — ABNORMAL LOW (ref 4.22–5.81)

## 2010-12-20 LAB — BASIC METABOLIC PANEL
CO2: 23 mEq/L (ref 19–32)
Calcium: 8.2 mg/dL — ABNORMAL LOW (ref 8.4–10.5)
GFR calc Af Amer: 68 mL/min — ABNORMAL LOW (ref >90)
GFR calc non Af Amer: 58 mL/min — ABNORMAL LOW (ref >90)
Sodium: 138 mEq/L (ref 135–145)

## 2010-12-20 SURGERY — CLOSURE, COLOSTOMY, LAPAROSCOPIC
Anesthesia: General | Site: Ureter | Laterality: Right | Wound class: Contaminated

## 2010-12-20 MED ORDER — ONDANSETRON HCL 4 MG/2ML IJ SOLN
INTRAMUSCULAR | Status: DC | PRN
  Administered 2010-12-20 (×2): 2 mg via INTRAVENOUS
  Administered 2010-12-20: 1 mg via INTRAVENOUS
  Administered 2010-12-20: 2 mg via INTRAVENOUS
  Administered 2010-12-20: 1 mg via INTRAVENOUS

## 2010-12-20 MED ORDER — SODIUM CHLORIDE 0.9 % IR SOLN
Status: DC | PRN
  Administered 2010-12-20: 3000 mL

## 2010-12-20 MED ORDER — SODIUM CHLORIDE 0.9 % IR SOLN
Status: DC | PRN
  Administered 2010-12-20: 1000 mL

## 2010-12-20 MED ORDER — PROMETHAZINE HCL 25 MG/ML IJ SOLN: 6.2500 mg | INTRAMUSCULAR | Status: DC | PRN

## 2010-12-20 MED ORDER — ALVIMOPAN 12 MG PO CAPS
ORAL_CAPSULE | ORAL | Status: AC
  Administered 2010-12-20: 12 mg via ORAL
  Filled 2010-12-20: qty 1

## 2010-12-20 MED ORDER — FENTANYL CITRATE 0.05 MG/ML IJ SOLN
INTRAMUSCULAR | Status: DC | PRN
  Administered 2010-12-20 (×3): 50 ug via INTRAVENOUS
  Administered 2010-12-20: 25 ug via INTRAVENOUS
  Administered 2010-12-20: 50 ug via INTRAVENOUS
  Administered 2010-12-20: 25 ug via INTRAVENOUS
  Administered 2010-12-20: 50 ug via INTRAVENOUS
  Administered 2010-12-20: 25 ug via INTRAVENOUS
  Administered 2010-12-20 (×3): 50 ug via INTRAVENOUS
  Administered 2010-12-20: 25 ug via INTRAVENOUS

## 2010-12-20 MED ORDER — LACTATED RINGERS IV SOLN
INTRAVENOUS | Status: DC
  Administered 2010-12-20: 1000 mL via INTRAVENOUS

## 2010-12-20 MED ORDER — SUCCINYLCHOLINE CHLORIDE 20 MG/ML IJ SOLN
INTRAMUSCULAR | Status: DC | PRN
  Administered 2010-12-20: 100 mg via INTRAVENOUS

## 2010-12-20 MED ORDER — ACETAMINOPHEN 10 MG/ML IV SOLN
INTRAVENOUS | Status: DC | PRN
  Administered 2010-12-20: 1000 mg via INTRAVENOUS

## 2010-12-20 MED ORDER — PROPOFOL 10 MG/ML IV EMUL
INTRAVENOUS | Status: DC | PRN
  Administered 2010-12-20: 200 mg via INTRAVENOUS

## 2010-12-20 MED ORDER — HYDROMORPHONE 0.3 MG/ML IV SOLN
INTRAVENOUS | Status: DC
  Administered 2010-12-20: 7.5 mg via INTRAVENOUS
  Administered 2010-12-21 (×2): 0.9 mg via INTRAVENOUS
  Administered 2010-12-21: 0.3 mg via INTRAVENOUS
  Administered 2010-12-21 – 2010-12-22 (×2): 1.2 mg via INTRAVENOUS
  Administered 2010-12-22: 0.3 mg via INTRAVENOUS
  Administered 2010-12-22: 1.2 mg via INTRAVENOUS
  Administered 2010-12-22 (×2): 0.6 mg via INTRAVENOUS
  Administered 2010-12-23: 0.3 mg via INTRAVENOUS
  Administered 2010-12-23 (×2): 0.6 mg via INTRAVENOUS
  Administered 2010-12-23: 0.9 mg via INTRAVENOUS
  Filled 2010-12-20 (×9): qty 25

## 2010-12-20 MED ORDER — MIDAZOLAM HCL 5 MG/5ML IJ SOLN
INTRAMUSCULAR | Status: DC | PRN
  Administered 2010-12-20: 2 mg via INTRAVENOUS

## 2010-12-20 MED ORDER — LIDOCAINE HCL (CARDIAC) 20 MG/ML IV SOLN
INTRAVENOUS | Status: DC | PRN
  Administered 2010-12-20: 80 mg via INTRAVENOUS

## 2010-12-20 MED ORDER — HYDROMORPHONE HCL PF 1 MG/ML IJ SOLN
INTRAMUSCULAR | Status: DC | PRN
  Administered 2010-12-20: .3 mg via INTRAVENOUS
  Administered 2010-12-20: .2 mg via INTRAVENOUS
  Administered 2010-12-20: 0.5 mg via INTRAVENOUS
  Administered 2010-12-20 (×3): .2 mg via INTRAVENOUS
  Administered 2010-12-20: .4 mg via INTRAVENOUS

## 2010-12-20 MED ORDER — BUPIVACAINE LIPOSOME 1.3 % IJ SUSP
20.0000 mL | INTRAMUSCULAR | Status: AC
  Administered 2010-12-20: 20 mL
  Filled 2010-12-20: qty 20

## 2010-12-20 MED ORDER — BUPIVACAINE-EPINEPHRINE PF 0.25-1:200000 % IJ SOLN
INTRAMUSCULAR | Status: DC | PRN
  Administered 2010-12-20: 30 mL

## 2010-12-20 MED ORDER — LACTATED RINGERS IV SOLN: INTRAVENOUS | Status: DC

## 2010-12-20 MED ORDER — CISATRACURIUM BESYLATE 2 MG/ML IV SOLN
INTRAVENOUS | Status: DC | PRN
  Administered 2010-12-20 (×4): 2 mg via INTRAVENOUS
  Administered 2010-12-20 (×2): 1 mg via INTRAVENOUS
  Administered 2010-12-20: 4 mg via INTRAVENOUS
  Administered 2010-12-20: 2 mg via INTRAVENOUS
  Administered 2010-12-20: 1 mg via INTRAVENOUS
  Administered 2010-12-20: 3 mg via INTRAVENOUS
  Administered 2010-12-20: 1 mg via INTRAVENOUS
  Administered 2010-12-20: 2 mg via INTRAVENOUS
  Administered 2010-12-20: 6 mg via INTRAVENOUS
  Administered 2010-12-20: 1 mg via INTRAVENOUS

## 2010-12-20 MED ORDER — BUPIVACAINE-EPINEPHRINE PF 0.25-1:200000 % IJ SOLN
INTRAMUSCULAR | Status: AC
  Filled 2010-12-20: qty 30

## 2010-12-20 MED ORDER — LACTATED RINGERS IV SOLN
INTRAVENOUS | Status: DC
  Administered 2010-12-21 – 2010-12-22 (×3): via INTRAVENOUS

## 2010-12-20 MED ORDER — LIDOCAINE-EPINEPHRINE (PF) 1 %-1:200000 IJ SOLN: INTRAMUSCULAR | Status: DC | PRN

## 2010-12-20 MED ORDER — DEXAMETHASONE SODIUM PHOSPHATE 4 MG/ML IJ SOLN
INTRAMUSCULAR | Status: DC | PRN
  Administered 2010-12-20: 10 mg via INTRAVENOUS

## 2010-12-20 MED ORDER — HEPARIN SODIUM (PORCINE) 5000 UNIT/ML IJ SOLN: 5000.0000 [IU] | Freq: Once | INTRAMUSCULAR | Status: DC

## 2010-12-20 MED ORDER — LACTATED RINGERS IR SOLN
Status: DC | PRN
  Administered 2010-12-20: 3000 mL

## 2010-12-20 MED ORDER — SODIUM CHLORIDE 0.9 % IV SOLN
INTRAVENOUS | Status: AC
  Filled 2010-12-20: qty 50

## 2010-12-20 MED ORDER — EPHEDRINE SULFATE 50 MG/ML IJ SOLN
INTRAMUSCULAR | Status: DC | PRN
  Administered 2010-12-20 (×3): 5 mg via INTRAVENOUS

## 2010-12-20 MED ORDER — IOHEXOL 300 MG/ML  SOLN
INTRAMUSCULAR | Status: AC
  Filled 2010-12-20: qty 1

## 2010-12-20 MED ORDER — ACETAMINOPHEN 10 MG/ML IV SOLN
INTRAVENOUS | Status: AC
  Filled 2010-12-20: qty 100

## 2010-12-20 MED ORDER — LACTATED RINGERS IV SOLN
INTRAVENOUS | Status: DC | PRN
  Administered 2010-12-20 (×5): via INTRAVENOUS

## 2010-12-20 MED ORDER — FENTANYL CITRATE 0.05 MG/ML IJ SOLN: 25.0000 ug | INTRAMUSCULAR | Status: DC | PRN

## 2010-12-20 MED ORDER — MEPERIDINE HCL 50 MG/ML IJ SOLN
INTRAMUSCULAR | Status: AC
  Filled 2010-12-20: qty 1

## 2010-12-20 MED ORDER — HEPARIN SODIUM (PORCINE) 5000 UNIT/ML IJ SOLN
INTRAMUSCULAR | Status: AC
  Filled 2010-12-20: qty 1

## 2010-12-20 MED ORDER — HEPARIN SODIUM (PORCINE) 5000 UNIT/ML IJ SOLN
5000.0000 [IU] | Freq: Once | INTRAMUSCULAR | Status: DC
  Administered 2010-12-20: 5000 [IU] via SUBCUTANEOUS

## 2010-12-20 MED ORDER — HETASTARCH-ELECTROLYTES 6 % IV SOLN
INTRAVENOUS | Status: DC | PRN
  Administered 2010-12-20 (×2): via INTRAVENOUS

## 2010-12-20 MED ORDER — ALVIMOPAN 12 MG PO CAPS
12.0000 mg | ORAL_CAPSULE | Freq: Once | ORAL | Status: AC
  Administered 2010-12-20: 12 mg via ORAL

## 2010-12-20 MED ORDER — SODIUM CHLORIDE 0.9 % IV SOLN
1.0000 g | INTRAVENOUS | Status: DC | PRN
  Administered 2010-12-20: 1 g via INTRAVENOUS

## 2010-12-20 SURGICAL SUPPLY — 116 items
ADAPTER CATH URET PLST 4-6FR (CATHETERS) ×5 IMPLANT
ADPR CATH URET STRL DISP 4-6FR (CATHETERS) ×4
APPLIER CLIP 5 13 M/L LIGAMAX5 (MISCELLANEOUS) ×5
APPLIER CLIP ROT 10 11.4 M/L (STAPLE)
APR CLP MED LRG 11.4X10 (STAPLE)
APR CLP MED LRG 5 ANG JAW (MISCELLANEOUS) ×4
BAG URINE DRAINAGE (UROLOGICAL SUPPLIES) ×2 IMPLANT
BAG URO CATCHER STRL LF (DRAPE) ×5 IMPLANT
BASKET ZERO TIP NITINOL 2.4FR (BASKET) IMPLANT
BLADE EXTENDED COATED 6.5IN (ELECTRODE) IMPLANT
BLADE HEX COATED 2.75 (ELECTRODE) ×7 IMPLANT
BLADE SURG SZ10 CARB STEEL (BLADE) ×7 IMPLANT
BSKT STON RTRVL ZERO TP 2.4FR (BASKET)
CABLE HIGH FREQUENCY MONO STRZ (ELECTRODE) ×5 IMPLANT
CANISTER SUCTION 2500CC (MISCELLANEOUS) ×11 IMPLANT
CANNULA ENDOPATH XCEL 11M (ENDOMECHANICALS) IMPLANT
CATH FOLEY 2WAY SLVR  5CC 16FR (CATHETERS) ×1
CATH FOLEY 2WAY SLVR 5CC 16FR (CATHETERS) ×1 IMPLANT
CATH INTERMIT  6FR 70CM (CATHETERS) ×2 IMPLANT
CATH URET 5FR 28IN CONE TIP (BALLOONS)
CATH URET 5FR 70CM CONE TIP (BALLOONS) IMPLANT
CELLS DAT CNTRL 66122 CELL SVR (MISCELLANEOUS) IMPLANT
CLIP APPLIE 5 13 M/L LIGAMAX5 (MISCELLANEOUS) ×1 IMPLANT
CLIP APPLIE ROT 10 11.4 M/L (STAPLE) IMPLANT
CLOTH BEACON ORANGE TIMEOUT ST (SAFETY) ×12 IMPLANT
COVER MAYO STAND STRL (DRAPES) ×7 IMPLANT
DECANTER SPIKE VIAL GLASS SM (MISCELLANEOUS) ×5 IMPLANT
DISSECTOR BLUNT TIP ENDO 5MM (MISCELLANEOUS) IMPLANT
DRAIN CHANNEL RND F F (WOUND CARE) ×2 IMPLANT
DRAPE CAMERA CLOSED 9X96 (DRAPES) ×5 IMPLANT
DRAPE INCISE IOBAN 66X45 STRL (DRAPES) ×2 IMPLANT
DRAPE LAPAROSCOPIC ABDOMINAL (DRAPES) ×5 IMPLANT
DRAPE LAPAROTOMY TRNSV 102X78 (DRAPE) ×2 IMPLANT
DRAPE LG THREE QUARTER DISP (DRAPES) ×6 IMPLANT
DRAPE WARM FLUID 44X44 (DRAPE) ×9 IMPLANT
DRSG TEGADERM 4X4.75 (GAUZE/BANDAGES/DRESSINGS) ×2 IMPLANT
ELECT REM PT RETURN 9FT ADLT (ELECTROSURGICAL) ×5
ELECTRODE REM PT RTRN 9FT ADLT (ELECTROSURGICAL) ×4 IMPLANT
FILTER SMOKE EVAC LAPAROSHD (FILTER) IMPLANT
GAUZE SPONGE 2X2 8PLY STRL LF (GAUZE/BANDAGES/DRESSINGS) ×1 IMPLANT
GLOVE BIOGEL M 8.0 STRL (GLOVE) ×5 IMPLANT
GLOVE BIOGEL PI IND STRL 7.0 (GLOVE) ×4 IMPLANT
GLOVE BIOGEL PI INDICATOR 7.0 (GLOVE) ×1
GLOVE ECLIPSE 8.0 STRL XLNG CF (GLOVE) ×26 IMPLANT
GLOVE INDICATOR 8.0 STRL GRN (GLOVE) ×10 IMPLANT
GLOVE SURG SS PI 7.5 STRL IVOR (GLOVE) ×6 IMPLANT
GOWN PREVENTION PLUS XLARGE (GOWN DISPOSABLE) ×11 IMPLANT
GOWN STRL NON-REIN LRG LVL3 (GOWN DISPOSABLE) ×9 IMPLANT
GOWN STRL REIN XL XLG (GOWN DISPOSABLE) ×19 IMPLANT
GUIDEWIRE STR DUAL SENSOR (WIRE) ×2 IMPLANT
IV NS IRRIG 3000ML ARTHROMATIC (IV SOLUTION) ×2 IMPLANT
KIT BASIN OR (CUSTOM PROCEDURE TRAY) ×11 IMPLANT
LEGGING LITHOTOMY PAIR STRL (DRAPES) ×4 IMPLANT
LIGASURE IMPACT 36 18CM CVD LR (INSTRUMENTS) IMPLANT
LUBRICANT JELLY K Y 4OZ (MISCELLANEOUS) ×6 IMPLANT
MANIFOLD NEPTUNE II (INSTRUMENTS) ×3 IMPLANT
NS IRRIG 1000ML POUR BTL (IV SOLUTION) ×18 IMPLANT
PACK CYSTO (CUSTOM PROCEDURE TRAY) ×5 IMPLANT
PENCIL BUTTON HOLSTER BLD 10FT (ELECTRODE) ×5 IMPLANT
RETRACTOR WND ALEXIS 18 MED (MISCELLANEOUS) IMPLANT
RTRCTR WOUND ALEXIS 18CM MED (MISCELLANEOUS)
SCALPEL HARMONIC ACE (MISCELLANEOUS) ×2 IMPLANT
SCISSORS LAP 5X35 DISP (ENDOMECHANICALS) ×9 IMPLANT
SEALER TISSUE G2 CVD JAW 35 (ENDOMECHANICALS) ×1 IMPLANT
SEALER TISSUE G2 CVD JAW 45CM (ENDOMECHANICALS) ×1
SET CYSTO W/LG BORE CLAMP LF (SET/KITS/TRAYS/PACK) ×2 IMPLANT
SET IRRIG TUBING LAPAROSCOPIC (IRRIGATION / IRRIGATOR) ×2 IMPLANT
SLEEVE XCEL OPT CAN 5 100 (ENDOMECHANICALS) IMPLANT
SLEEVE Z-THREAD 5X100MM (TROCAR) ×4 IMPLANT
SOLUTION ANTI FOG 6CC (MISCELLANEOUS) ×5 IMPLANT
SPONGE GAUZE 2X2 8PLY STRL LF (GAUZE/BANDAGES/DRESSINGS) ×2 IMPLANT
SPONGE GAUZE 2X2 STER 10/PKG (GAUZE/BANDAGES/DRESSINGS) ×1
SPONGE GAUZE 4X4 12PLY (GAUZE/BANDAGES/DRESSINGS) ×5 IMPLANT
SPONGE LAP 18X18 X RAY DECT (DISPOSABLE) ×12 IMPLANT
STAPLER CIRC ILS CVD 25MM (STAPLE) ×2 IMPLANT
STAPLER VISISTAT 35W (STAPLE) ×5 IMPLANT
STENT CONTOUR 7FRX24 (STENTS) ×2 IMPLANT
SUCTION POOLE TIP (SUCTIONS) ×7 IMPLANT
SUT ETHILON 2 0 PS N (SUTURE) ×2 IMPLANT
SUT MNCRL AB 4-0 PS2 18 (SUTURE) ×4 IMPLANT
SUT PDS AB 1 CTX 36 (SUTURE) IMPLANT
SUT PDS AB 1 TP1 96 (SUTURE) ×8 IMPLANT
SUT PROLENE 0 SH 30 (SUTURE) ×2 IMPLANT
SUT PROLENE 2 0 SH DA (SUTURE) ×2 IMPLANT
SUT SILK 2 0 (SUTURE)
SUT SILK 2 0 SH CR/8 (SUTURE) ×3 IMPLANT
SUT SILK 2-0 18XBRD TIE 12 (SUTURE) ×3 IMPLANT
SUT SILK 3 0 (SUTURE)
SUT SILK 3 0 SH CR/8 (SUTURE) ×3 IMPLANT
SUT SILK 3-0 18XBRD TIE 12 (SUTURE) ×3 IMPLANT
SUT VIC AB 2-0 SH 27 (SUTURE) ×40
SUT VIC AB 2-0 SH 27X BRD (SUTURE) ×8 IMPLANT
SUT VIC AB 3-0 SH 27 (SUTURE) ×5
SUT VIC AB 3-0 SH 27X BRD (SUTURE) ×1 IMPLANT
SUT VIC AB 3-0 SH 8-18 (SUTURE) ×4 IMPLANT
SUT VICRYL 2 0 18  UND BR (SUTURE)
SUT VICRYL 2 0 18 UND BR (SUTURE) ×3 IMPLANT
SYR 50ML LL SCALE MARK (SYRINGE) ×2 IMPLANT
SYS LAPSCP GELPORT 120MM (MISCELLANEOUS) ×10
SYSTEM LAPSCP GELPORT 120MM (MISCELLANEOUS) ×2 IMPLANT
TOWEL OR 17X26 10 PK STRL BLUE (TOWEL DISPOSABLE) ×20 IMPLANT
TRAY FOLEY CATH 14FRSI W/METER (CATHETERS) ×5 IMPLANT
TRAY FOLEY CATH 16FR SILVER (SET/KITS/TRAYS/PACK) ×2 IMPLANT
TRAY LAP CHOLE (CUSTOM PROCEDURE TRAY) ×5 IMPLANT
TROCAR BLADELESS OPT 5 100 (ENDOMECHANICALS) IMPLANT
TROCAR BLADELESS OPT 5 75 (ENDOMECHANICALS) ×10 IMPLANT
TROCAR XCEL BLUNT TIP 100MML (ENDOMECHANICALS) IMPLANT
TROCAR XCEL NON-BLD 11X100MML (ENDOMECHANICALS) IMPLANT
TROCAR Z-THREAD FIOS 5X100MM (TROCAR) ×2 IMPLANT
TUBING CONNECTING 10 (TUBING) ×4 IMPLANT
TUBING INSUFFLATION 10FT LAP (TUBING) ×5 IMPLANT
URINEMETER 200ML W/220 (MISCELLANEOUS) ×2 IMPLANT
WATER STERILE IRR 1500ML POUR (IV SOLUTION) ×2 IMPLANT
WIRE COONS/BENSON .038X145CM (WIRE) ×3 IMPLANT
YANKAUER SUCT BULB TIP 10FT TU (MISCELLANEOUS) ×7 IMPLANT
YANKAUER SUCT BULB TIP NO VENT (SUCTIONS) ×5 IMPLANT

## 2010-12-20 NOTE — Brief Op Note (Addendum)
12/20/2010  9:20 PM  PATIENT:  Eric Munoz  70 y.o. male  PRE-OPERATIVE DIAGNOSIS:  colorectal cancer status post low anterior resection  POST-OPERATIVE DIAGNOSIS:  colorectal cancer status post low anterior resection  PROCEDURES:  LAP LOA X 3hours LAP SPLENIC FLEXURE MOBILIZATION LAPAROSCOPIC COLOSTOMY TAKEDOWN w 25EEA stapled anastomosis PRIMARY BLADDER REPAIR X 2 DIVERTING LOOP ILEOSTOMY REMOVAL PORT-A-CATH  SURGEON:   [name], MD  PHYSICIAN ASSISTANT:  Adolph Pollack, MD Mariella Saa, MD  ANESTHESIA:   general  Total I/O In: 1500 [I.V.:1000; IV Piggyback:500] Out: 150 [Blood:300]  EBL:  300  BLOOD ADMINISTERED:none  DRAINS: (19Fr) Blake drain(s) in the Pelvis   LOCAL MEDICATIONS USED:  BUPIVICAINE 70CC  SPECIMEN:  Source of Specimen:  1. colostomy 2. Anastomotic rings (blue stitch proximal ring)  DISPOSITION OF SPECIMEN:  PATHOLOGY  COUNTS:  YES  TOURNIQUET:  * No tourniquets in log *  DICTATION: .Other Dictation: Dictation Number [id number]  PLAN OF CARE: Admit to inpatient   PATIENT DISPOSITION:  PACU - hemodynamically stable.   Delay start of Pharmacological VTE agent (>24hrs) due to surgical blood loss or risk of bleeding:  NO

## 2010-12-20 NOTE — Transfer of Care (Signed)
Immediate Anesthesia Transfer of Care Note  Patient: Eric Munoz  Procedure(s) Performed:  LAPAROSCOPIC COLOSTOMY TAKEDOWN - Laparoscopy Colostomy Takedownn With Ileostomy; CYSTOSCOPY WITH STENT REPLACEMENT - Cystoscopy/Left Double J Stent Exchange; REMOVAL PORT-A-CATH - venous catheter no longer needed.  Patient Location: PACU  Anesthesia Type: General  Level of Consciousness: sedated  Airway & Oxygen Therapy: Patient Spontanous Breathing and Patient connected to T-piece oxygen  Post-op Assessment: Report given to PACU RN and Post -op Vital signs reviewed and stable  Post vital signs: Reviewed and stable  Complications: No apparent anesthesia complications

## 2010-12-20 NOTE — Anesthesia Postprocedure Evaluation (Signed)
Anesthesia Post Note  Patient: Eric Munoz  Procedure(s) Performed:  LAPAROSCOPIC COLOSTOMY TAKEDOWN - Laparoscopy Colostomy Takedownn With Ileostomy; CYSTOSCOPY WITH STENT REPLACEMENT - Cystoscopy/Left Double J Stent Exchange; REMOVAL PORT-A-CATH - Right  venous catheter no longer needed.  Anesthesia type: General  Patient location: PACU  Post pain: Pain level controlled  Post assessment: Post-op Vital signs reviewed  Last Vitals:  Filed Vitals:   12/20/10 0926  BP: 163/74  Pulse: 62  Temp: 36.1 C  Resp: 16    Post vital signs: Reviewed  Level of consciousness: sedated  Complications: No apparent anesthesia complications

## 2010-12-20 NOTE — Anesthesia Preprocedure Evaluation (Signed)
Anesthesia Evaluation  Patient identified by MRN, date of birth, ID band Patient awake    Reviewed: Allergy & Precautions, H&P , NPO status , Patient's Chart, lab work & pertinent test results  Airway Mallampati: II TM Distance: <3 FB     Dental  (+) Teeth Intact and Dental Advisory Given   Pulmonary neg pulmonary ROS,  clear to auscultation  Pulmonary exam normal       Cardiovascular neg cardio ROS Regular Normal    Neuro/Psych Negative Neurological ROS  Negative Psych ROS   GI/Hepatic Neg liver ROS, Hx Colon cancer s/p resection with radiation and chemotherapy    Endo/Other  Negative Endocrine ROS  Renal/GU negative Renal ROS  Genitourinary negative   Musculoskeletal negative musculoskeletal ROS (+)   Abdominal   Peds negative pediatric ROS (+)  Hematology negative hematology ROS (+)   Anesthesia Other Findings   Reproductive/Obstetrics negative OB ROS                           Anesthesia Physical Anesthesia Plan  ASA: II  Anesthesia Plan: General   Post-op Pain Management:    Induction: Intravenous  Airway Management Planned: Oral ETT  Additional Equipment:   Intra-op Plan:   Post-operative Plan: Extubation in OR  Informed Consent: I have reviewed the patients History and Physical, chart, labs and discussed the procedure including the risks, benefits and alternatives for the proposed anesthesia with the patient or authorized representative who has indicated his/her understanding and acceptance.   Dental advisory given  Plan Discussed with: CRNA  Anesthesia Plan Comments:         Anesthesia Quick Evaluation

## 2010-12-20 NOTE — H&P (Signed)
HPI  Patient Care Team:  [name] as PCP - General (Family Medicine)  [name] as Consulting Physician (Urology)  [name] as Consulting Physician (Radiation Oncology)  [name] as Consulting Physician (Gastroenterology)  [name] as Consulting Physician (Gastroenterology)  [name] as Consulting Physician (Hematology and Oncology)  This patient is a 70 y.o.male who presents today for surgical evaluation.  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 He had polyps in his colon and rectum resected in 2007. He was lost to followup. He returned with a large cancer in his left colon and recurrent polyp in his rectum. He underwent resection July 2012. Because there was obvious inflammation and involvement to numerous contiguous structures, I placed an end colostomy. His margin was positive.  Since that time, he is undergone radiation therapy and chemotherapy under the cares of radiation and medical oncology. He a followup colonoscopy July 2012 but showed no other abnormalities. His exercise tolerance is good. He had some right leg weakness the first few months after surgery but that is all resolved. His weight has been stable. He empties his colostomy 1-3 times a day which has been stable. No complaints of urinary incontinence. No difficulty with erection or ejaculation.  He developed a ureteral stricture. [facility] has been changing his stents out. The last time was in July. He recalls being told followup stent replacement in January 2013.  He comes today with his wife's in the hopes that the colostomy could be taken down. He has no  evidence of any active disease. Last PET scan in April 2012 showed no recurrence.   Past Medical History   Diagnosis  Date   .  Easy bruising    .  Colon cancer      Stage 4, [id number]   .  Adenomatous rectal polyp  2007, recurrent [id number]    Past Surgical History   Procedure  Date   .  Colon surgery  08/07/09    History    Social History   .  Marital Status:  Married     Spouse Name:  N/A     Number of Children:  N/A   .  Years of Education:  N/A    Occupational History   .  Not on file.    Social History Main Topics   .  Smoking status:  Former Games developer   .  Smokeless tobacco:  Never Used   .  Alcohol Use:  No   .  Drug Use:  No   .  Sexually Active:     Other Topics  Concern   .  Not on file    Social History Narrative   .  No narrative on file    Family History   Problem  Relation  Age of Onset   .  Stroke  Father  73    Current outpatient prescriptions:Multiple Vitamin (MULTIVITAMIN PO), Take by mouth daily. , Disp: , Rfl:  Not on File  Review of Systems  Constitutional: Negative for fever, chills and diaphoresis.  HENT: Negative for nosebleeds, sore throat, facial swelling, mouth sores, trouble swallowing and ear discharge.  Eyes: Negative for photophobia, discharge  and visual disturbance.  Respiratory: Negative for choking, chest tightness, shortness of breath and stridor.  Cardiovascular: Negative for chest pain and palpitations.  Gastrointestinal: Negative for nausea, vomiting, abdominal pain, diarrhea, constipation, blood in stool, abdominal distention, anal bleeding and rectal pain.  Genitourinary: Negative for dysuria, urgency, difficulty urinating and testicular pain.  Musculoskeletal: Negative for myalgias, back pain, arthralgias and gait problem.  Skin: Negative for color change, pallor, rash and wound.  Neurological: Negative for dizziness, speech difficulty, weakness, numbness and headaches.  Hematological: Negative for adenopathy. Does not  bruise/bleed easily.  Psychiatric/Behavioral: Negative for hallucinations, confusion and agitation.    Objective:   Physical Exam  Constitutional: He is oriented to person, place, and time. He appears well-developed and well-nourished. No distress.  HENT:  Head: Normocephalic.  Mouth/Throat: Oropharynx is clear and moist. No oropharyngeal exudate.  Eyes: Conjunctivae and EOM are normal. Pupils are equal, round, and reactive to light. No scleral icterus.  Neck: Normal range of motion. Neck supple. No tracheal deviation present.  Cardiovascular: Normal rate, regular rhythm and intact distal pulses.  Pulmonary/Chest: Effort normal and breath sounds normal. No respiratory distress.  Abdominal: Soft. He exhibits no distension and no mass. There is no tenderness. There is no rebound and no guarding. Hernia confirmed negative in the right inguinal area and confirmed negative in the left inguinal area.  L mid colostomy intact  Genitourinary: Penis normal. No penile tenderness.  Rectal stump (anal canal) 2cm above sphincters. Mildly decreased sphincter tone but no defects.  Musculoskeletal: Normal range of motion. He exhibits no tenderness.  Lymphadenopathy:  He has no cervical adenopathy.  Right: No inguinal adenopathy present.  Left: No inguinal adenopathy present.  Neurological: He is alert and oriented to person, place, and time. No cranial nerve deficit. He exhibits normal muscle tone. Coordination normal.  Skin: Skin is warm and dry. No rash noted. He is not diaphoretic. No erythema. No pallor.  Psychiatric: He has a normal mood and affect. His behavior is normal. Judgment and thought content normal.    Assessment:    Stage IV colon cancer stable 7.5 months from completion of chemotherapy and radiation therapy. Functioning well.  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 He has no evidence of recurrence in  the PET scan last month: therefore, I think it is reasonable to consider colostomy takedown. It is reasonable to start laparoscopically with a possibility of converting to open. I did caution him that he may have evidence of carcinomatosis or cancer recurrence intraoperatively that no other study will see that would make me have to abort the procedure.   Dr. Hillis Range with Urology will do a stent exchange at the beginning of the case and continue exchanges every 6 months or longer.   They do understand is a possibility of his local recurrence that something to come back requiring another operation with a permanent colostomy or further problems down the road. That is hard to foresee. They wish to be aggressive with a colostomy takedown if possible. It would be about 10 months since completion of all therapy. It'll be well over a year since surgery. Think is reasonable to consider it at that time.   The anatomy & physiology of the digestive tract was discussed.  The pathophysiology was discussed.  Possibility of remaining with an ostomy permanently was discussed.  I offered ostomy takedown.  Laparoscopic & open techniques were discussed.  Risks such as bleeding, infection, abscess, leak, reoperation, possible re-ostomy, injury to other organs, hernia, heart attack, death, and other risks were discussed.  Goals of post-operative recovery were discussed as well.  We will work to minimize complications.  Questions were answered.  The patient expresses understanding & wishes to proceed with surgery.

## 2010-12-20 NOTE — Op Note (Signed)
Preoperative diagnosis: Left ureteral stricture  Postoperative diagnosis: Same  Principal procedure: Cystoscopy, left double-J stent exchange (7 x 24 contour stent without string and parentheses  Surgeon: Felipe Cabell  Anesthesia: Gen. endotracheal  16 French Foley left indwelling for general surgical procedure.  Complications: None  Brief history: Mr. 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 He is status post colectomy years ago, with repeat laparotomy and colon/retroperitoneal tumor resection by Dr. Michaell Cowing in 2011. He has had an indwelling stent since that time, do to poor ureteral drainage and obvious persistent stricture. He presents at this time for takedown of his colostomy, and subsequent double-J stent exchange.  Risks and complications of the procedure of double-J stent exchange have been discussed with the patient. He understands these and desires to proceed. He is aware that the "goal" of the procedure is to change the stent for proper kidney drainage.  Description of procedure: The patient was identified in the preoperative area and received preoperative IV antibiotics. He was taken to the operating room where general endotracheal anesthesia was established. He was placed in a dorsolithotomy position. Genitalia and perineum were prepped and draped. Time out was then performed.  The procedure then commenced. A 22 French panendoscope was advanced through his urethra under direct vision. Urethra was normal, prostate was slightly enlarged but without obstruction. The bladder was inspected circumferentially. No tumors trabeculations were noted. There was a stent protruding through the left ureteral orifice which was grasped and extracted to his urethral meatus. Using fluoroscopic guidance, a guidewire was advanced through the stent, up into the left renal pelvis. Over this guidewire, a 24 cm x 7  French contour stent was placed using fluoroscopic and cystoscopic guidance. Once the wire was removed, good proximal and distal curl were seen. The bladder was drained with a 16 French Foley catheter which had 10 cc of water in the balloon. The patient tolerated the procedure well. Dr. Michaell Cowing then commenced with his procedure.

## 2010-12-21 ENCOUNTER — Other Ambulatory Visit (INDEPENDENT_AMBULATORY_CARE_PROVIDER_SITE_OTHER): Payer: Self-pay | Admitting: Surgery

## 2010-12-21 ENCOUNTER — Encounter (HOSPITAL_COMMUNITY): Payer: Self-pay | Admitting: *Deleted

## 2010-12-21 LAB — CREATININE, FLUID (PLEURAL, PERITONEAL, JP DRAINAGE)

## 2010-12-21 LAB — BASIC METABOLIC PANEL
Chloride: 104 mEq/L (ref 96–112)
GFR calc Af Amer: 48 mL/min — ABNORMAL LOW (ref >90)
GFR calc non Af Amer: 42 mL/min — ABNORMAL LOW (ref >90)
Potassium: 3.7 mEq/L (ref 3.5–5.1)
Sodium: 136 mEq/L (ref 135–145)

## 2010-12-21 LAB — CBC
Hemoglobin: 9.4 g/dL — ABNORMAL LOW (ref 13.0–17.0)
MCHC: 34.1 g/dL (ref 30.0–36.0)
RBC: 3.03 MIL/uL — ABNORMAL LOW (ref 4.22–5.81)
WBC: 7.7 10*3/uL (ref 4.0–10.5)

## 2010-12-21 MED ORDER — LIP MEDEX EX OINT
1.0000 "application " | TOPICAL_OINTMENT | Freq: Two times a day (BID) | CUTANEOUS | Status: DC
  Administered 2010-12-21 – 2010-12-26 (×11): 1 via TOPICAL
  Filled 2010-12-21 (×2): qty 7

## 2010-12-21 MED ORDER — HYDROMORPHONE HCL PF 2 MG/ML IJ SOLN: 0.5000 mg | INTRAMUSCULAR | Status: DC | PRN

## 2010-12-21 MED ORDER — SODIUM CHLORIDE 0.9 % IV BOLUS (SEPSIS)
500.0000 mL | Freq: Once | INTRAVENOUS | Status: AC
  Administered 2010-12-21: 500 mL via INTRAVENOUS

## 2010-12-21 MED ORDER — DIPHENHYDRAMINE HCL 50 MG/ML IJ SOLN: 12.5000 mg | Freq: Four times a day (QID) | INTRAMUSCULAR | Status: DC | PRN

## 2010-12-21 MED ORDER — ONDANSETRON 4 MG PO TBDP: 4.0000 mg | ORAL_TABLET | Freq: Four times a day (QID) | ORAL | Status: DC | PRN

## 2010-12-21 MED ORDER — FLORA-Q PO CAPS
1.0000 | ORAL_CAPSULE | Freq: Every day | ORAL | Status: DC
  Administered 2010-12-21 – 2010-12-24 (×4): 1 via ORAL
  Filled 2010-12-21 (×5): qty 1

## 2010-12-21 MED ORDER — KETOROLAC TROMETHAMINE 15 MG/ML IJ SOLN: 15.0000 mg | Freq: Four times a day (QID) | INTRAMUSCULAR | Status: DC | PRN

## 2010-12-21 MED ORDER — NALOXONE HCL 0.4 MG/ML IJ SOLN: 0.4000 mg | INTRAMUSCULAR | Status: DC | PRN

## 2010-12-21 MED ORDER — PSYLLIUM 95 % PO PACK
1.0000 | PACK | Freq: Two times a day (BID) | ORAL | Status: DC
  Administered 2010-12-21 – 2010-12-23 (×6): 1 via ORAL
  Filled 2010-12-21 (×7): qty 1

## 2010-12-21 MED ORDER — WITCH HAZEL-GLYCERIN EX PADS: 1.0000 "application " | MEDICATED_PAD | CUTANEOUS | Status: DC | PRN

## 2010-12-21 MED ORDER — PROMETHAZINE HCL 25 MG/ML IJ SOLN: 12.5000 mg | Freq: Four times a day (QID) | INTRAMUSCULAR | Status: DC | PRN

## 2010-12-21 MED ORDER — ACETAMINOPHEN 325 MG PO TABS
650.0000 mg | ORAL_TABLET | Freq: Four times a day (QID) | ORAL | Status: DC
  Administered 2010-12-21 – 2010-12-26 (×19): 650 mg via ORAL
  Filled 2010-12-21 (×28): qty 2

## 2010-12-21 MED ORDER — BIOTENE DRY MOUTH MT LIQD
15.0000 mL | Freq: Two times a day (BID) | OROMUCOSAL | Status: DC
  Administered 2010-12-21 – 2010-12-25 (×10): 15 mL via OROMUCOSAL

## 2010-12-21 MED ORDER — MAGIC MOUTHWASH
15.0000 mL | Freq: Four times a day (QID) | ORAL | Status: DC | PRN
  Filled 2010-12-21: qty 15

## 2010-12-21 MED ORDER — DIPHENHYDRAMINE HCL 25 MG PO CAPS: 25.0000 mg | ORAL_CAPSULE | Freq: Four times a day (QID) | ORAL | Status: DC | PRN

## 2010-12-21 MED ORDER — SODIUM CHLORIDE 0.9 % IJ SOLN: 9.0000 mL | INTRAMUSCULAR | Status: DC | PRN

## 2010-12-21 MED ORDER — ALVIMOPAN 12 MG PO CAPS
12.0000 mg | ORAL_CAPSULE | Freq: Two times a day (BID) | ORAL | Status: DC
  Administered 2010-12-22 – 2010-12-26 (×8): 12 mg via ORAL
  Filled 2010-12-21 (×10): qty 1

## 2010-12-21 MED ORDER — HEPARIN SODIUM (PORCINE) 5000 UNIT/ML IJ SOLN
5000.0000 [IU] | Freq: Three times a day (TID) | INTRAMUSCULAR | Status: DC
  Administered 2010-12-21 – 2010-12-25 (×11): 5000 [IU] via SUBCUTANEOUS
  Filled 2010-12-21 (×16): qty 1

## 2010-12-21 MED ORDER — ALUM & MAG HYDROXIDE-SIMETH 200-200-20 MG/5ML PO SUSP: 30.0000 mL | Freq: Four times a day (QID) | ORAL | Status: DC | PRN

## 2010-12-21 MED ORDER — HYDROMORPHONE BOLUS VIA INFUSION: 0.5000 mg | INTRAVENOUS | Status: DC | PRN

## 2010-12-21 NOTE — Op Note (Signed)
NAME:  Eric Munoz, Eric Munoz NO.:  [id number]  MEDICAL RECORD NO.:  [id number]  LOCATION:  [facility]                         FACILITY:  [facility]  PHYSICIAN:  [name], MD     DATE OF BIRTH:  1940/05/31  DATE OF PROCEDURE:  12/20/2010 DATE OF DISCHARGE:                              OPERATIVE REPORT   PRIMARY CARE PHYSICIAN:  C. Duane Lope, M.D.  UROLOGIST:  Bertram Millard. [name], M.D.  RADIATION ONCOLOGIST:  Radene Gunning, M.D., Ph.D.  MEDICAL ONCOLOGIST:  Kimberlee Nearing, MD  GASTROENTEROLOGIST:  [name], M.D.  OPERATING SURGEON:  [name], MD.  ASSISTANTS:   Adolph Pollack, M.D. first assist at the beginning of the case, and Lorne Skeens. Hoxworth, M.D. at the end of the case.  PROCEDURE PERFORMED: 1. Laparoscopic assisted lysis of adhesions x 3 hours (half of the case). 2. Laparoscopically assisted splenic flexure mobilization. 3. Laparoscopic colostomy takedown with EEA stapled anastomosis. 4. Primary repair of bladder injury x2, in 2 layers. 5. Diverting loop ileostomy in the right upper quadrant. 6. Removal of Port-A-Catheter. 7. Rigid Proctoscopy  INDICATIONS:  Eric Munoz is a 70 year old gentleman with history of a sigmoid colon cancer and a rectal polyp that was removed several years Ago by Dr. [name].  He was lost to followup.  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  He has required a partial resection of his psoas muscle and the internal iliac and debridement of his left ureter as well.   He had radiation and chemotherapy postoperatively.  He has been disease free.  He had a PET scan that was negative for any metastatic disease, stable for a year off treatment.  He wished to have a colostomy takedown.  The technique of takedown was discussed.  Risks, benefits were discussed. Possibility of injury to other  organs, prolonged hospital stay, a week ICU stay, stroke, heart attack, death, and other risks were discussed in detail.  He and his wife agreed to proceed.  OPERATIVE FINDINGS:  He had dense inflammatory adhesions, intra-loop adhesions and adhesions to his retroperitoneal especially posteriorly. He had dense adhesions to his pelvis as well.  His pelvis was very contracted and narrow.  This is a 25 EEA stapled anastomosis with 1 cm thickness at the verge. He has been referred for the ileostomy to check the anastomosis.  SPECIMENS: 1. Colostomy. 2. Anastomotic rings.  Blue stitch is proximal.  ESTIMATED BLOOD LOSS:  300 mL.  COMPLICATIONS:  Bladder injury x2 repaired primarily in 2 layers with Vicryl.  DRAINS:  A 19-French Blake drain across the right lower quadrant.  It swings over to the left pericolic gutter and goes down into the pelvis.  DESCRIPTION OF PROCEDURE:  Informed consent was confirmed.  The patient underwent general anesthesia without any difficulty.  He received IV Invanz.  He was on the anti-ileus protocol.  He was positioned in low lithotomy with arms tucked.  His abdomen and perineum were prepped and draped in sterile fashion.  Dr. [name] exchanged his left ureteral catheter,  please see his note for details.  He then sent the patient to me.  He came in, he was re-prepped and draped.  Surgical time- out confirmed our plan.  I placed a 5 mm port in the right upper quadrant using optical entry technique with the patient in steep reverse Trendelenburg and right side up.  The entry was clean.  He had a moderate amount of adhesions of small bowel and especially of the omentum involving most of his anterior abdominal wall.  I was able to get a 5 mm port in the right flank region.  I used sharp dissection to free off adhesions.  With that, I placed another 5 mm port in the right suprapubic region.  I was able to free the omentum and small bowel adhesions.   They were dense but mainly wispy and we were able to free off without much bleeding.  Later on I placed a port at the umbilicus, left upper quadrant, left lower quadrant and the pelvis as well.  These were all 5 mm ports.  Eventually I was able to free the adhesions off the colostomy before proceeding to the descending colon.  I did further dissection to free the greater omentum off the small bowel to better define the anatomy.  I did further dissection to help free the ileum off the right lower quadrant into the right pelvic brim to get into the pelvis.  He had rather dense adhesions of the greater omentum and small bowel to the pelvis at the anterior peritoneal flexion, I was able to go preperitoneal a little bit anteriorly and found a clean pocket.  Using that, I came over towards the left side.  As expected there were more dense adhesions of the small bowel to the left retroperitoneum consistent with his prior left psoas resection. Eventually I was able to free those off using cold scissors and sharp dissection meticulously to help mobilize the left pericolic gutter.  I was able to mobilize the small bowel off and get down to the pelvis as well.  I could free off adhesions to the left lateral wall of the pelvis and a little bit anteriorly as well.  I eventually was able to free the small bowel down off the bladder and better expose the pelvis.  I did a digital rectal exam, I could see movement of the bladder, but it was hard to see the rectal stump.  He had a large bladder that had fallen down.  It was covering up the rectal stump.  Eventually, I put in a hand Gelport through a short midline incision and was able to feel the pelvis.  With that, I could complete freeing the bladder and prostate off the rectal stump using careful dissection.  His pelvis was narrow.  I saw and felt both ureters coming from the sidewalls into the lower bladder.  I stayed away from them.  I was able to get  down to the deep pelvis to the rectal stump.  I did inspection of the bladder, we could see leaking in 2 spots with filling up of the bladder irrigation.  I repaired the bladder in two layers using 2-0 Vicryl interrupted stitches at the mucosal layer and the muscle layer.  We rechecked the bladder & the bladder was watertight.  We went ahead and took the colostomy down.  I had already done a purse-string stitch around the colostomy at the skin prior to starting the case.  I did a transverse elliptical incision  around the colostomy and was able to free off the colon attachments to the skin, subcutaneous tissue and fascia and come around and free it off using focused sharp and cautery dissection.  We brought it up through the low midline Gelport wound.  We transected about 2 cm off as that was the intra-abdominal wall component, we had healthier tissue.  I transected with the scalpel and got healthy bleeding mucosa.  Because the pelvis was extremely narrow and his colon was already rather narrowed, Dr. Abbey Chatters and I agreed that it should use a 25 EEA stapler.  So, we placed the anvil in there and put a 0 Prolene pursestring around and tied that down.  We changed gloves.  Next it was obvious that the colostomy was not going to reach down to the pelvis very easily.  Therefore, I mobilized the splenic flexure off the colon by mobilizing the descending and transverse colon and freeing the detachments off the retroperitoneum and as well off anteriorly.  I did get into the lesser sac and free the transverse colon all the way up to the hepatic flexure and helped mobilize that down.  I had already ligated the IMV in the last case, so I had some mobility.  I freed the greater omentum attachments off the small bowel anteriorly to help mobilize it as well. Eventually in time I could free the transverse mesocolon off its attachments off the inferior pancreatic rim taking care to avoid injury to the  middle colic vessels.  With that, I actually ended up having pretty good mobilization and laid the remaining colon along the right pericolic gutter to avoid any twist or torsion of the mesentery. I did bring that out to make sure it was good and viable.  I did transect a little bit of the mesentery off the descending colon close to the retroperitoneum about 5 cm.  That helped to straighten the colon out, so it then reached down into the deep pelvis very well.  The serosa looked a little bit beat up, but was certainly viable, was peristalsing.  Next I checked the bladder repair and it seemed intact.  At this point, Dr. Abbey Chatters scrubbed out and Dr. Johna Sheriff came in.  He did gentle anal dilation, was able to place a 25 EEA stapler up.  It was a very short rectal stump.  I could feel the stapler at the end of the rectal stump with my fingers and directed it a little bit posteriorly and brought the spike out into the very deep pelvis.  I attached the anvil of the colon to the spike, and he tightened the stapler down to the rectal stump.  He held the stapler clamped down.  I checked and could feel no involvement of sphincters, prostate or other abnormalities.  We held the stapler clamped for 60 seconds.  He fired and held it clamped for 30 seconds.  He released and removed the stapler.  He examined and got 2 good anastomotic rings.  He did rigid proctoscopy.  He scoped and saw viable mucosa, pink and happy.  The anastomosis was 2 cm from the anal verge.  It felt intact.  Inspection noted no bleeding.  He did insufflate it while I clamped proximal anastomosis under water.  There was no leaking of air, implying an air tight seal and a negative leak test.  However, given the extensive surgery in this case and the very low anastomosis I felt he was high risk for leak and decided  to place a diverting loop ileostomy.  Dr. Johna Sheriff heartily agreed.  We switched gloves out and he helped me find the  terminal ileum, coming off the cecum and mobilized that loop off its attachments on the retroperitoneum and intra-loop adhesions until I had mobilized a good foot and half of terminal ileum, and once I had that fully mobilized it easily stretched up the supraumbilical port, which seemed to be a nice flat area.  I did copious irrigation of all quadrants, and had good hemostasis.  I inspected the small bowel, especially the left retroperitoneum and saw no evidence of injury requiring any repair and no evidence of leak or breach.  The bladder looked viable.  I went ahead and placed a drain as noted above.  We secured the drain using a nylon stitch.  I evacuated and removed the ports.  I did open up to make a 3 cm circular incision in the right upper quadrant paramedian port site.  I then open up the anterior rectus fascia transversely and then in the posterior rectus fascia vertically and dilated to 3 fingers and brought a loop of ileum up quite easily without tension.  I did more irrigation, checked the bladder to make sure it was intact.  I inspected the small bowel, colon, and the rest of the abdomen, especially at areas of dissection.  I saw no injuries requiring repair.  We found one oozer on the greater omentum and controlled that.  Hemostasis was now good.  We closed the fascia of the colostomy, the posterior rectus fascia using 0 Vicryl stitch vertically and the anterior rectus fascia using #1 running PDS transversely.  I did bring a healthy tongue of greater omentum and laid that down between the bladder and the rectum .  I closed the lower midline wound from the gel port using #1 looped PDS.  As I closed the fascia on the low midline I did put a few stitches through the omentum only to help tack that down and keep it from moving out of the pelvis.  This way it could act as a good omental patch.  I did not want to put any more stitches to directly patch it onto the bladder.  I reapproximated  the skin using 4-0 Monocryl stitch leaving gaps in the colostomy in the low midline.  We had cleaned out the wounds at each layer of closure.  The wound sites were packed with some Betadine-soaked wicks.  After placing sterile dressings, I matured the ileostomy in a Brooke fashion using interrupted 3-0 Vicryl stitches.  The proximal lumen is cephalad/superior.  The lumens intubated easily with no torsion/twisting in the abdominal wall.  The Foley output had not been great, I checked it.  It flushed.  It would not Drain / aspirate well.  I went and exchanged it out and put a new 16 Fr Foley in easily that flushed and irrigated well.  Immediately got 1 or 2 blood clots initially and then about 300 mL of pink-tinged urine that came out pretty easily.  We prepped and draped the right chest.  I went ahead and did a oblique incision over the infraclavicular Port.  I eviscerated the Port, trimmed the blue permanent stitches securing the Port-A-Cath to the pectoralis Fascia, and removed the entire Port-A-Cath intact.  I removed the stitches.  I ensured hemostasis.  I closed the wound using 4-0 Monocryl stitch and placed a sterile dressing.  The patient was brought the the PACU.  He  had no problems with hemodynamics intraoperatively and was in stable condition.  Given the extensive length of the case I will follow him in the ICU at least overnight.  My partner, Dr. Johna Sheriff is on-call this week and is familiar with the patient.  I discussed postop goals with the patient in detail in the office and again just prior to surgery.  I discussed intraoperative findings and post-op plans/goals with his wife as well. She expressed understanding and appreciation.   [name], MD     SCG/MEDQ  D:  12/20/2010  T:  12/21/2010  Job:  [id number]  cc:   Gabrielle Dare. Janee Morn, M.D. [facility] [address] [place], [place] [address]

## 2010-12-21 NOTE — Progress Notes (Signed)
  1 Day Post-Op  Subjective: Moderate pain, controlled with meds.  No nausea  Objective: Vital signs in last 24 hours: Temp:  [96.9 F (36.1 C)-99 F (37.2 C)] 98 F (36.7 C) (11/17 0800) Pulse Rate:  [62-116] 106  (11/17 0800) Resp:  [8-28] 28  (11/17 0800) BP: (143-173)/(70-95) 143/84 mmHg (11/17 0800) SpO2:  [92 %-100 %] 100 % (11/17 0800) Weight:  [162 lb 4.1 oz (73.6 kg)] 162 lb 4.1 oz (73.6 kg) (11/17 0041)    Intake/Output from previous day: 11/16 0701 - 11/17 0700 In: 5950 [I.V.:5200; IV Piggyback:750] Out: 1900 [Urine:875; Drains:625; Stool:50; Blood:350] Intake/Output this shift: Total I/O In: 390 [P.O.:240; I.V.:150] Out: 245 [Urine:175; Drains:50; Stool:20]  General appearance: alert and no distress Resp: BS clear decreased at bases GI: normal findings: soft, non-tender Incision/Wound:dressings dry Urine blood tinged  Lab Results:   Basename 12/21/10 0310 12/20/10 2211  WBC 7.7 7.8  HGB 9.4* 10.2*  HCT 27.6* 29.6*  PLT 160 163   BMET  Basename 12/21/10 0310 12/20/10 2211  NA 136 138  K 3.7 4.0  CL 104 105  CO2 26 23  GLUCOSE 213* 182*  BUN 27* 22  CREATININE 1.61* 1.22  CALCIUM 8.1* 8.2*   PT/INR No results found for this basename: LABPROT:2,INR:2 in the last 72 hours ABG No results found for this basename: PHART:2,PCO2:2,PO2:2,HCO3:2 in the last 72 hours  Studies/Results: Dg C-arm 1-60 Min-no Report  12/20/2010  CLINICAL DATA: left ureteral stent exchange   C-ARM 1-60 MINUTES  Fluoroscopy was utilized by the requesting physician.  No radiographic  interpretation.      Anti-infectives: Anti-infectives     Start     Dose/Rate Route Frequency Ordered Stop   12/20/10 0600   ertapenem (INVANZ) 1 g in sodium chloride 0.9 % 50 mL IVPB  Status:  Discontinued        1 g 100 mL/hr over 30 Minutes Intravenous On call to O.R. 12/19/10 1922 12/21/10 0025          Assessment/Plan: s/p Procedure(s): LAPAROSCOPIC COLOSTOMY TAKEDOWN CYSTOSCOPY  WITH STENT REPLACEMENT REMOVAL PORT-A-CATH Stable post op.  Mild ARI-monitor UOP, repeat lab in AM.  Pulm toilet and OOB   LOS: 1 day    Huan Pollok T 12/21/2010

## 2010-12-21 NOTE — Plan of Care (Signed)
Problem: Phase I Progression Outcomes Goal: Voiding-avoid urinary catheter unless indicated Outcome: Not Applicable Date Met:  12/21/10 Have physician order to NOT remove foley due to surgery type.

## 2010-12-22 LAB — CARDIAC PANEL(CRET KIN+CKTOT+MB+TROPI)
CK, MB: 3.6 ng/mL (ref 0.3–4.0)
CK, MB: 3 ng/mL (ref 0.3–4.0)
Relative Index: 1.9 (ref 0.0–2.5)
Total CK: 147 U/L (ref 7–232)
Troponin I: <0.3 ng/mL (ref <0.30)
Troponin I: <0.3 ng/mL (ref <0.30)
Troponin I: <0.3 ng/mL (ref <0.30)

## 2010-12-22 LAB — CBC
HCT: 26.2 % — ABNORMAL LOW (ref 39.0–52.0)
Hemoglobin: 9.1 g/dL — ABNORMAL LOW (ref 13.0–17.0)
MCV: 90.3 fL (ref 78.0–100.0)
Platelets: 152 10*3/uL (ref 150–400)
RBC: 2.9 MIL/uL — ABNORMAL LOW (ref 4.22–5.81)
WBC: 9.3 10*3/uL (ref 4.0–10.5)

## 2010-12-22 LAB — DIFFERENTIAL
Eosinophils Relative: 0 % (ref 0–5)
Lymphocytes Relative: 5 % — ABNORMAL LOW (ref 12–46)
Lymphs Abs: 0.5 10*3/uL — ABNORMAL LOW (ref 0.7–4.0)
Monocytes Relative: 8 % (ref 3–12)

## 2010-12-22 LAB — BASIC METABOLIC PANEL
CO2: 28 mEq/L (ref 19–32)
Calcium: 8.1 mg/dL — ABNORMAL LOW (ref 8.4–10.5)
Glucose, Bld: 104 mg/dL — ABNORMAL HIGH (ref 70–99)
Sodium: 135 mEq/L (ref 135–145)

## 2010-12-22 MED ORDER — SODIUM CHLORIDE 0.9 % IV BOLUS (SEPSIS)
500.0000 mL | Freq: Once | INTRAVENOUS | Status: AC
  Administered 2010-12-22: 500 mL via INTRAVENOUS

## 2010-12-22 NOTE — Progress Notes (Signed)
  2 Days Post-Op  Subjective: C/O some epigastric, low chest pain this AM "indigestion".  No N/V.  Has been walking.  Tol [place] diet  Objective: Vital signs in last 24 hours: Temp:  [97.7 F (36.5 C)-99.3 F (37.4 C)] 98 F (36.7 C) (11/18 0407) Pulse Rate:  [100-115] 108  (11/18 0730) Resp:  [18-28] 26  (11/18 0731) BP: (127-164)/(60-84) 164/76 mmHg (11/18 0730) SpO2:  [96 %-100 %] 98 % (11/18 0731) Weight:  [162 lb 11.2 oz (73.8 kg)] 162 lb 11.2 oz (73.8 kg) (11/18 0400) Last BM Date: 12/20/10  Intake/Output from previous day: 11/17 0701 - 11/18 0700 In: 2795 [P.O.:1220; I.V.:1575] Out: 1795 [Urine:1215; Drains:540; Stool:40] Intake/Output this shift:    General appearance: alert and no distress Cardio: S1, S2 normal and mild tachcardia GI: normal findings: soft, non-tender and ileostomy pink and functioning Incision/Wound:Dressed, clean and dry. JP serosanguinous  Lab Results:   Basename 12/22/10 0320 12/21/10 0310  WBC 9.3 7.7  HGB 9.1* 9.4*  HCT 26.2* 27.6*  PLT 152 160   BMET  Basename 12/22/10 0320 12/21/10 0310  NA 135 136  K 3.8 3.7  CL 103 104  CO2 28 26  GLUCOSE 104* 213*  BUN 32* 27*  CREATININE 2.23* 1.61*  CALCIUM 8.1* 8.1*   PT/INR No results found for this basename: LABPROT:2,INR:2 in the last 72 hours ABG No results found for this basename: PHART:2,PCO2:2,PO2:2,HCO3:2 in the last 72 hours  Studies/Results: Dg C-arm 1-60 Min-no Report  12/20/2010  CLINICAL DATA: left ureteral stent exchange   C-ARM 1-60 MINUTES  Fluoroscopy was utilized by the requesting physician.  No radiographic  interpretation.      Anti-infectives: Anti-infectives     Start     Dose/Rate Route Frequency Ordered Stop   12/20/10 0600   ertapenem (INVANZ) 1 g in sodium chloride 0.9 % 50 mL IVPB  Status:  Discontinued        1 g 100 mL/hr over 30 Minutes Intravenous On call to O.R. 12/19/10 1922 12/21/10 0025         EKG-No acute changes  Assessment/Plan: s/p  Procedure(s): LAPAROSCOPIC COLOSTOMY TAKEDOWN CYSTOSCOPY WITH STENT REPLACEMENT REMOVAL PORT-A-CATH  Overall appears very stable ARI-UOP currently good-monitor closely, check urine for myoglobin CP-doubt cardiac-EKG nl, check CEs    LOS: 2 days    Eric Munoz 12/22/2010

## 2010-12-23 ENCOUNTER — Encounter (HOSPITAL_COMMUNITY): Payer: Self-pay | Admitting: Surgery

## 2010-12-23 DIAGNOSIS — D649 Anemia, unspecified: Secondary | ICD-10-CM

## 2010-12-23 DIAGNOSIS — N179 Acute kidney failure, unspecified: Secondary | ICD-10-CM | POA: Diagnosis not present

## 2010-12-23 DIAGNOSIS — Z932 Ileostomy status: Secondary | ICD-10-CM

## 2010-12-23 LAB — COMPREHENSIVE METABOLIC PANEL
ALT: 24 U/L (ref 0–53)
BUN: 29 mg/dL — ABNORMAL HIGH (ref 6–23)
CO2: 25 mEq/L (ref 19–32)
Calcium: 8.1 mg/dL — ABNORMAL LOW (ref 8.4–10.5)
GFR calc Af Amer: 38 mL/min — ABNORMAL LOW (ref >90)
GFR calc non Af Amer: 32 mL/min — ABNORMAL LOW (ref >90)
Glucose, Bld: 103 mg/dL — ABNORMAL HIGH (ref 70–99)
Sodium: 134 mEq/L — ABNORMAL LOW (ref 135–145)

## 2010-12-23 LAB — CBC
Hemoglobin: 9.1 g/dL — ABNORMAL LOW (ref 13.0–17.0)
MCHC: 35.1 g/dL (ref 30.0–36.0)
RDW: 13.5 % (ref 11.5–15.5)
WBC: 11.1 10*3/uL — ABNORMAL HIGH (ref 4.0–10.5)

## 2010-12-23 MED ORDER — PSYLLIUM 95 % PO PACK
1.0000 | PACK | Freq: Every day | ORAL | Status: DC
  Administered 2010-12-24: 1 via ORAL
  Filled 2010-12-23 (×2): qty 1

## 2010-12-23 MED ORDER — LACTATED RINGERS IV SOLN
INTRAVENOUS | Status: DC
  Administered 2010-12-23: 14:00:00 via INTRAVENOUS

## 2010-12-23 NOTE — Progress Notes (Signed)
INITIAL ADULT NUTRITION ASSESSMENT Date: 12/23/2010   Time: 2:09 PM Reason for Assessment: health hx  ASSESSMENT: Male 70 y.o.  Dx: History of colon cancer, stage IV  Hx:  Past Medical History  Diagnosis Date  . Easy bruising   . Adenomatous rectal polyp 2007, recurrent [id number]  . Colon cancer 12-11-10    Stage 4, [id number]([place] last 2'12/ Radiation-last 1'12  . Difficult intubation     Past Surgical History  Procedure Date  . Colon surgery 08/07/09    12-11-10 Colon surgery with colostomy, now to have reversed 12-20-10  . Portacath placement 12-11-10    09-17-09 right chest/ states will be removed 12-20-10    Related Meds:  Scheduled Meds:   . acetaminophen  650 mg Oral QID  . alvimopan  12 mg Oral BID  . antiseptic oral rinse  15 mL Mouth Rinse BID  . Flora-Q  1 capsule Oral Daily  . heparin  5,000 Units Subcutaneous Q8H  . HYDROmorphone PCA 0.3 mg/mL   Intravenous Q4H  . lip balm  1 application Topical BID  . psyllium  1 packet Oral Daily  . sodium chloride  500 mL Intravenous Once  . DISCONTD: psyllium  1 packet Oral BID   Continuous Infusions:   . lactated ringers 50 mL/hr at 12/23/10 1346  . DISCONTD: lactated ringers 75 mL/hr at 12/22/10 2216   PRN Meds:.alum & mag hydroxide-simeth, diphenhydrAMINE, diphenhydrAMINE, HYDROmorphone (DILAUDID) injection, ketorolac, magic mouthwash, naloxone, ondansetron, promethazine, sodium chloride, witch hazel-glycerin   Ht: 5\' 10"  (177.8 cm)  Wt: 177 lb 14.6 oz (80.7 kg)  Ideal Wt: 82.1 kg  % Ideal Wt: 100%  Usual Wt: same % Usual Wt:   Body mass index is 25.53 kg/(m^2).  Food/Nutrition Related Hx: Pt diet advanced to full liquids.  Pt reports hunger and anticipation of lunch meal.  Pt and wife deny any questions related to nutrition, nutrition progression, or diet post-op.  Pt plans to follow pre-op diet as he was tolerating it well.  Labs:  CMP     Component Value Date/Time   NA 134* 12/23/2010 0316   K 4.0  12/23/2010 0316   CL 103 12/23/2010 0316   CO2 25 12/23/2010 0316   GLUCOSE 103* 12/23/2010 0316   BUN 29* 12/23/2010 0316   CREATININE 1.98* 12/23/2010 0316   CALCIUM 8.1* 12/23/2010 0316   PROT 4.8* 12/23/2010 0316   ALBUMIN 2.0* 12/23/2010 0316   AST 27 12/23/2010 0316   ALT 24 12/23/2010 0316   ALKPHOS 67 12/23/2010 0316   BILITOT 0.4 12/23/2010 0316   GFRNONAA 32* 12/23/2010 0316   GFRAA 38* 12/23/2010 0316    CBC    Component Value Date/Time   WBC 11.1* 12/23/2010 0316   WBC 3.5* 11/29/2010 0825   RBC 2.85* 12/23/2010 0316   RBC 4.34 11/29/2010 0825   HGB 9.1* 12/23/2010 0316   HGB 13.9 11/29/2010 0825   HCT 25.9* 12/23/2010 0316   HCT 40.4 11/29/2010 0825   PLT 165 12/23/2010 0316   PLT 181 11/29/2010 0825   MCV 90.9 12/23/2010 0316   MCV 93.1 11/29/2010 0825   MCH 31.9 12/23/2010 0316   MCH 32.1 11/29/2010 0825   MCHC 35.1 12/23/2010 0316   MCHC 34.5 11/29/2010 0825   RDW 13.5 12/23/2010 0316   RDW 13.9 11/29/2010 0825   LYMPHSABS 0.5* 12/22/2010 0320   LYMPHSABS 0.4* 11/29/2010 0825   MONOABS 0.8 12/22/2010 0320   MONOABS 0.3 11/29/2010 0825   EOSABS 0.0 12/22/2010 0320  EOSABS 0.1 11/29/2010 0825   BASOSABS 0.0 12/22/2010 0320   BASOSABS 0.0 11/29/2010 0825    Intake: 50-100% liquid meals Output:   Diet Order: Full Liquid  Supplements/Tube Feeding: none at this time  IVF:    lactated ringers Last Rate: 50 mL/hr at 12/23/10 1346  DISCONTD: lactated ringers Last Rate: 75 mL/hr at 12/22/10 2216    Estimated Nutritional Needs:   Kcal:2260-2580 kcal Protein:96-112 g Fluid:~2.5 L/day  NUTRITION DIAGNOSIS: -Inadequate oral intake (NI-2.1).  Status: Ongoing  RELATED TO: omission of energy dense foods  AS EVIDENCE BY: unsupplemented full liquid diet.  Pt NPO/liquids for 3 days  MONITORING/EVALUATION(Goals): 1.  Food/Beverage; continued diet advancement to Mechanical soft or Low Fiber goal per MD discretion.   EDUCATION NEEDS: -Education  needs addressed  INTERVENTION: 1.  Modify diet; Mechanical soft of Low fiber goal per MD. 2.  Ensure pudding, prn for nutrition supplementation.  Dietitian #: [phone]  DOCUMENTATION CODES Per approved criteria  -Not Applicable    Loyce Dys [facility] 12/23/2010, 2:09 PM

## 2010-12-23 NOTE — Plan of Care (Signed)
Problem: Phase I Progression Outcomes Goal: Pain controlled with appropriate interventions Outcome: Progressing Pain controlled with PCA Goal: Voiding-avoid urinary catheter unless indicated Outcome: Not Progressing MD does not want F/C discontinued unless he orders it, due to pt having stents placed.  Problem: Phase II Progression Outcomes Goal: Foley discontinued Outcome: Not Progressing Don't d/c without MD permission.

## 2010-12-23 NOTE — Progress Notes (Signed)
PCP: [name], MD  Outpatient Care Team: Patient Care Team: [name] as PCP - General (Family Medicine) [name] as Consulting Physician (Urology) [name], MD as Consulting Physician (Radiation Oncology) [name] as Consulting Physician (Gastroenterology) Samul Dada, MD as Consulting Physician (Hematology and Oncology)  Inpatient Treatment Team: Treatment Team: Attending Provider: [name], MD; Registered Nurse: Virl Cagey, RN; Technician: Gwenlyn Perking [place], [place]; Respiratory Therapist: Leonides Schanz, RRT; Registered Nurse: Doylene Canard, RN; Technician: Christella Scheuermann, NT; Respiratory Therapist: Veryl Speak, RRT; Registered Nurse: Geroge Baseman, RN   LOS: 3 days   3 Days Post-Op  Procedure(s): LAPAROSCOPIC COLOSTOMY TAKEDOWN CYSTOSCOPY WITH STENT REPLACEMENT Bladder repair Diverting loop ileostomy REMOVAL PORT-A-CATH  Subjective:  Sore - PCA helps Mild nausea w liquids x 1.  Not now. Denies weakness  Objective:  Vital signs:  Temp:  [98.1 F (36.7 C)-99.8 F (37.7 C)] 99 F (37.2 C) (11/19 0400) Pulse Rate:  [101-110] 101  (11/19 0400) Resp:  [18-26] 22  (11/19 0400) BP: (149-171)/(72-82) 155/82 mmHg (11/19 0400) SpO2:  [95 %-99 %] 96 % (11/19 0400) Weight:  [177 lb 14.6 oz (80.7 kg)] 177 lb 14.6 oz (80.7 kg) (11/19 0000) Last BM Date: 12/20/10  Intake/Output    from previous day: 11/18 0701 - 11/19 0700 In: 1675 [P.O.:100; I.V.:1575] Out: 1750 [Urine:1330; Drains:240; Stool:180]  Urine pink w a few smears of clot   this shift:    Flatus: scant but + BM: liquid dk green succus in ileostomy bag  Physical Exam:  General: Pt awake/alert/oriented x4 in no acute distress Eyes: PERRL, normal EOM.  Sclera clear.  No icterus Neuro: CN II-XII intact w/o focal sensory/motor deficits. Lymph: No head/neck/groin lymphadenopathy Psych:  No  delerium/psychosis/paranoia HENT: Normocephalic, Mucus membranes moist.  No thrush Neck: Supple, No tracheal deviation Chest: Clear No pain w good excursion CV:  Pulses intact.  Regular rhythm Abdomen: Soft, Mildly tender at incisions only/Nondistended.  No incarcerated hernias.  Ostomy pink Ext:  SCDs BLE.  No mjr edema.  No cyanosis Skin: No petechiae / purpurae  Results:   Labs: Results for orders placed during the hospital encounter of 12/20/10 (from the past 48 hour(s))  CREATININE, BODY FLUID     Status: Normal   Collection Time   12/21/10  6:28 PM      Component Value Range Comment   Creat, Fluid 2.0   NO NORMAL RANGE ESTABLISHED FOR THIS TEST   Fluid Type-FCRE JP DRAINAGE     CBC     Status: Abnormal   Collection Time   12/22/10  3:20 AM      Component Value Range Comment   WBC 9.3  4.0 - 10.5 (K/uL)    RBC 2.90 (*) 4.22 - 5.81 (MIL/uL)    Hemoglobin 9.1 (*) 13.0 - 17.0 (g/dL)    HCT 40.9 (*) 81.1 - 52.0 (%)    MCV 90.3  78.0 - 100.0 (fL)    MCH 31.4  26.0 - 34.0 (pg)    MCHC 34.7  30.0 - 36.0 (g/dL)    RDW 91.4  78.2 - 95.6 (%)    Platelets 152  150 - 400 (K/uL)   DIFFERENTIAL     Status: Abnormal   Collection Time   12/22/10  3:20 AM      Component Value Range Comment   Neutrophils Relative 87 (*) 43 - 77 (%)    Neutro Abs 8.1 (*)  1.7 - 7.7 (K/uL)    Lymphocytes Relative 5 (*) 12 - 46 (%)    Lymphs Abs 0.5 (*) 0.7 - 4.0 (K/uL)    Monocytes Relative 8  3 - 12 (%)    Monocytes Absolute 0.8  0.1 - 1.0 (K/uL)    Eosinophils Relative 0  0 - 5 (%)    Eosinophils Absolute 0.0  0.0 - 0.7 (K/uL)    Basophils Relative 0  0 - 1 (%)    Basophils Absolute 0.0  0.0 - 0.1 (K/uL)   BASIC METABOLIC PANEL     Status: Abnormal   Collection Time   12/22/10  3:20 AM      Component Value Range Comment   Sodium 135  135 - 145 (mEq/L)    Potassium 3.8  3.5 - 5.1 (mEq/L)    Chloride 103  96 - 112 (mEq/L)    CO2 28  19 - 32 (mEq/L)    Glucose, Bld 104 (*) 70 - 99 (mg/dL)    BUN  32 (*) 6 - 23 (mg/dL)    Creatinine, Ser 1.61 (*) 0.50 - 1.35 (mg/dL)    Calcium 8.1 (*) 8.4 - 10.5 (mg/dL)    GFR calc non Af Amer 28 (*) >90 (mL/min)    GFR calc Af Amer 33 (*) >90 (mL/min)   CARDIAC PANEL(CRET KIN+CKTOT+MB+TROPI)     Status: Normal   Collection Time   12/22/10  9:13 AM      Component Value Range Comment   Total CK 211  7 - 232 (U/L)    CK, MB 3.7  0.3 - 4.0 (ng/mL)    Troponin I <0.30  <0.30 (ng/mL)    Relative Index 1.8  0.0 - 2.5    CARDIAC PANEL(CRET KIN+CKTOT+MB+TROPI)     Status: Normal   Collection Time   12/22/10  2:35 PM      Component Value Range Comment   Total CK 188  7 - 232 (U/L)    CK, MB 3.6  0.3 - 4.0 (ng/mL)    Troponin I <0.30  <0.30 (ng/mL)    Relative Index 1.9  0.0 - 2.5    CARDIAC PANEL(CRET KIN+CKTOT+MB+TROPI)     Status: Normal   Collection Time   12/22/10  8:08 PM      Component Value Range Comment   Total CK 147  7 - 232 (U/L)    CK, MB 3.0  0.3 - 4.0 (ng/mL)    Troponin I <0.30  <0.30 (ng/mL)    Relative Index 2.0  0.0 - 2.5    COMPREHENSIVE METABOLIC PANEL     Status: Abnormal   Collection Time   12/23/10  3:16 AM      Component Value Range Comment   Sodium 134 (*) 135 - 145 (mEq/L)    Potassium 4.0  3.5 - 5.1 (mEq/L)    Chloride 103  96 - 112 (mEq/L)    CO2 25  19 - 32 (mEq/L)    Glucose, Bld 103 (*) 70 - 99 (mg/dL)    BUN 29 (*) 6 - 23 (mg/dL)    Creatinine, Ser 0.96 (*) 0.50 - 1.35 (mg/dL)    Calcium 8.1 (*) 8.4 - 10.5 (mg/dL)    Total Protein 4.8 (*) 6.0 - 8.3 (g/dL)    Albumin 2.0 (*) 3.5 - 5.2 (g/dL)    AST 27  0 - 37 (U/L) SLIGHT HEMOLYSIS   ALT 24  0 - 53 (U/L)    Alkaline Phosphatase 67  39 - 117 (U/L)    Total Bilirubin 0.4  0.3 - 1.2 (mg/dL)    GFR calc non Af Amer 32 (*) >90 (mL/min)    GFR calc Af Amer 38 (*) >90 (mL/min)   CBC     Status: Abnormal   Collection Time   12/23/10  3:16 AM      Component Value Range Comment   WBC 11.1 (*) 4.0 - 10.5 (K/uL)    RBC 2.85 (*) 4.22 - 5.81 (MIL/uL)    Hemoglobin  9.1 (*) 13.0 - 17.0 (g/dL)    HCT 11.9 (*) 14.7 - 52.0 (%)    MCV 90.9  78.0 - 100.0 (fL)    MCH 31.9  26.0 - 34.0 (pg)    MCHC 35.1  30.0 - 36.0 (g/dL)    RDW 82.9  56.2 - 13.0 (%)    Platelets 165  150 - 400 (K/uL)     Imaging / Studies: @RISRSLT24 @  Antibiotics: Anti-infectives     Start     Dose/Rate Route Frequency Ordered Stop   12/20/10 0600   ertapenem (INVANZ) 1 g in sodium chloride 0.9 % 50 mL IVPB  Status:  Discontinued        1 g 100 mL/hr over 30 Minutes Intravenous On call to O.R. 12/19/10 1922 12/21/10 0025          Medications / Allergies: per chart  Assessment / Plan [name]  70 y.o. 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 Gastrointestinal and Minimally Invasive Surgery [facility] [address] [place], [place] [address] [phone] Main / Paging [phone] Voice Mail  12/23/2010

## 2010-12-24 ENCOUNTER — Encounter (HOSPITAL_COMMUNITY): Payer: Self-pay | Admitting: Surgery

## 2010-12-24 LAB — CREATININE, SERUM: Creatinine, Ser: 1.84 mg/dL — ABNORMAL HIGH (ref 0.50–1.35)

## 2010-12-24 MED ORDER — OXYCODONE HCL 5 MG PO TABS
5.0000 mg | ORAL_TABLET | ORAL | Status: DC | PRN
  Administered 2010-12-26: 10 mg via ORAL
  Filled 2010-12-24: qty 2

## 2010-12-24 MED ORDER — LACTATED RINGERS IV BOLUS (SEPSIS)
1000.0000 mL | Freq: Four times a day (QID) | INTRAVENOUS | Status: AC | PRN
  Administered 2010-12-24: 1000 mL via INTRAVENOUS

## 2010-12-24 NOTE — Progress Notes (Signed)
PCP: [name], MD  Outpatient Care Team: Patient Care Team: [name] as PCP - General (Family Medicine) [name] as Consulting Physician (Urology) [name], MD as Consulting Physician (Radiation Oncology) [name] as Consulting Physician (Gastroenterology) Samul Dada, MD as Consulting Physician (Hematology and Oncology)  Inpatient Treatment Team: Treatment Team: Attending Provider: [name], MD; Registered Nurse: Virl Cagey, RN; Technician: Gwenlyn Perking [place], [place]; Respiratory Therapist: Leonides Schanz, RRT; Registered Nurse: Doylene Canard, RN; Technician: Christella Scheuermann, NT; Registered Nurse: Clent Ridges, RN; Registered Nurse: Romeo Rabon, RN; Technician: Timmothy Sours, NT; Technician: Gala Murdoch, NT; Respiratory Therapist: Marvel Plan, RRT; Technician: Vella Raring, NT   LOS: 4 days   4 Days Post-Op  Procedure(s): LAPAROSCOPIC COLOSTOMY TAKEDOWN CYSTOSCOPY WITH STENT REPLACEMENT REMOVAL PORT-A-CATH  Subjective:  Transferred to floor. No N/V.  Tolerating full liq Not seen WOCN RN yet Walking more  Objective:  Vital signs:  Temp:  [97.9 F (36.6 C)-99.1 F (37.3 C)] 98.6 F (37 C) (11/20 0200) Pulse Rate:  [101-106] 103  (11/20 0200) Resp:  [18-24] 18  (11/20 0405) BP: (127-159)/(67-80) 144/80 mmHg (11/20 0200) SpO2:  [94 %-100 %] 96 % (11/20 0405) Last BM Date: 12/23/10  Intake/Output    from previous day: 11/19 0701 - 11/20 0700 In: 1110 [P.O.:960; I.V.:150] Out: 4110 [Urine:1950; Drains:160; Stool:1300]  this shift:    Flatus: + BM: 1300 succus in bag  Physical Exam:  General: Pt awake/alert/oriented x4 in no acute distress Eyes: PERRL, normal EOM.  Sclera clear.  No icterus Neuro: CN II-XII intact w/o focal sensory/motor deficits. Lymph: No head/neck/groin lymphadenopathy Psych:  No delerium/psychosis/paranoia HENT:  Normocephalic, Mucus membranes moist.  No thrush Neck: Supple, No tracheal deviation Chest: Clear lungs, No pain w good excursion CV:  Pulses intact.  Regular rhythm Abdomen: Incisions clean. Soft, Nontender/Nondistended.  No incarcerated hernias. Ext:  SCDs BLE.  No mjr edema.  No cyanosis Skin: No petechiae / purpurae  Results:   Labs: Results for orders placed during the hospital encounter of 12/20/10 (from the past 48 hour(s))  CARDIAC PANEL(CRET KIN+CKTOT+MB+TROPI)     Status: Normal   Collection Time   12/22/10  9:13 AM      Component Value Range Comment   Total CK 211  7 - 232 (U/L)    CK, MB 3.7  0.3 - 4.0 (ng/mL)    Troponin I <0.30  <0.30 (ng/mL)    Relative Index 1.8  0.0 - 2.5    CARDIAC PANEL(CRET KIN+CKTOT+MB+TROPI)     Status: Normal   Collection Time   12/22/10  2:35 PM      Component Value Range Comment   Total CK 188  7 - 232 (U/L)    CK, MB 3.6  0.3 - 4.0 (ng/mL)    Troponin I <0.30  <0.30 (ng/mL)    Relative Index 1.9  0.0 - 2.5    CARDIAC PANEL(CRET KIN+CKTOT+MB+TROPI)     Status: Normal   Collection Time   12/22/10  8:08 PM      Component Value Range Comment   Total CK 147  7 - 232 (U/L)    CK, MB 3.0  0.3 - 4.0 (ng/mL)    Troponin I <0.30  <0.30 (ng/mL)    Relative Index 2.0  0.0 - 2.5    COMPREHENSIVE METABOLIC PANEL     Status: Abnormal   Collection Time   12/23/10  3:16 AM  Component Value Range Comment   Sodium 134 (*) 135 - 145 (mEq/L)    Potassium 4.0  3.5 - 5.1 (mEq/L)    Chloride 103  96 - 112 (mEq/L)    CO2 25  19 - 32 (mEq/L)    Glucose, Bld 103 (*) 70 - 99 (mg/dL)    BUN 29 (*) 6 - 23 (mg/dL)    Creatinine, Ser 1.61 (*) 0.50 - 1.35 (mg/dL)    Calcium 8.1 (*) 8.4 - 10.5 (mg/dL)    Total Protein 4.8 (*) 6.0 - 8.3 (g/dL)    Albumin 2.0 (*) 3.5 - 5.2 (g/dL)    AST 27  0 - 37 (U/L) SLIGHT HEMOLYSIS   ALT 24  0 - 53 (U/L)    Alkaline Phosphatase 67  39 - 117 (U/L)    Total Bilirubin 0.4  0.3 - 1.2 (mg/dL)    GFR calc non Af Amer 32  (*) >90 (mL/min)    GFR calc Af Amer 38 (*) >90 (mL/min)   CBC     Status: Abnormal   Collection Time   12/23/10  3:16 AM      Component Value Range Comment   WBC 11.1 (*) 4.0 - 10.5 (K/uL)    RBC 2.85 (*) 4.22 - 5.81 (MIL/uL)    Hemoglobin 9.1 (*) 13.0 - 17.0 (g/dL)    HCT 09.6 (*) 04.5 - 52.0 (%)    MCV 90.9  78.0 - 100.0 (fL)    MCH 31.9  26.0 - 34.0 (pg)    MCHC 35.1  30.0 - 36.0 (g/dL)    RDW 40.9  81.1 - 91.4 (%)    Platelets 165  150 - 400 (K/uL)     Imaging / Studies: @RISRSLT24 @  Antibiotics: Anti-infectives     Start     Dose/Rate Route Frequency Ordered Stop   12/20/10 0600   ertapenem (INVANZ) 1 g in sodium chloride 0.9 % 50 mL IVPB  Status:  Discontinued        1 g 100 mL/hr over 30 Minutes Intravenous On call to O.R. 12/19/10 1922 12/21/10 0025          Medications / Allergies: per chart  Assessment / Plan: [name]  70 y.o. 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 Gastrointestinal and Minimally Invasive Surgery [facility] [address] [place], [place] [address] [phone] Main / Paging [phone] Voice Mail   12/24/2010

## 2010-12-25 ENCOUNTER — Encounter (HOSPITAL_COMMUNITY): Payer: Self-pay | Admitting: Surgery

## 2010-12-25 DIAGNOSIS — D62 Acute posthemorrhagic anemia: Secondary | ICD-10-CM | POA: Diagnosis not present

## 2010-12-25 LAB — POTASSIUM: Potassium: 3.8 mEq/L (ref 3.5–5.1)

## 2010-12-25 LAB — HEMOGLOBIN: Hemoglobin: 8.4 g/dL — ABNORMAL LOW (ref 13.0–17.0)

## 2010-12-25 LAB — CREATININE, SERUM: GFR calc Af Amer: 38 mL/min — ABNORMAL LOW (ref >90)

## 2010-12-25 MED ORDER — LOPERAMIDE HCL 2 MG PO CAPS: 2.0000 mg | ORAL_CAPSULE | Freq: Every day | ORAL | Status: DC

## 2010-12-25 MED ORDER — SODIUM CHLORIDE 0.9 % IJ SOLN
3.0000 mL | Freq: Two times a day (BID) | INTRAMUSCULAR | Status: DC
  Administered 2010-12-25: 3 mL via INTRAVENOUS

## 2010-12-25 MED ORDER — LOPERAMIDE HCL 2 MG PO CAPS
2.0000 mg | ORAL_CAPSULE | Freq: Every day | ORAL | Status: DC
  Administered 2010-12-25: 2 mg via ORAL
  Filled 2010-12-25 (×2): qty 1

## 2010-12-25 MED ORDER — LOPERAMIDE HCL 2 MG PO CAPS
4.0000 mg | ORAL_CAPSULE | Freq: Every day | ORAL | Status: DC
  Administered 2010-12-25: 4 mg via ORAL
  Filled 2010-12-25 (×2): qty 2

## 2010-12-25 MED ORDER — BISMUTH SUBSALICYLATE 262 MG/15ML PO SUSP
30.0000 mL | Freq: Two times a day (BID) | ORAL | Status: DC
  Administered 2010-12-25 – 2010-12-26 (×2): 30 mL via ORAL
  Filled 2010-12-25 (×2): qty 236

## 2010-12-25 MED ORDER — SODIUM CHLORIDE 0.9 % IJ SOLN: 3.0000 mL | INTRAMUSCULAR | Status: DC | PRN

## 2010-12-25 NOTE — Progress Notes (Signed)
Dr. Desiree Hane radiology order for out patient cystogram will need to be in your discharge summary. Thanks Conchita Paris RN

## 2010-12-25 NOTE — Progress Notes (Signed)
PCP: [name], MD  Outpatient Care Team: Patient Care Team: [name] as PCP - General (Family Medicine) [name] as Consulting Physician (Urology) [name], MD as Consulting Physician (Radiation Oncology) [name] as Consulting Physician (Gastroenterology) Samul Dada, MD as Consulting Physician (Hematology and Oncology)  Inpatient Treatment Team: Treatment Team: Attending Provider: [name], MD; Registered Nurse: Virl Cagey, RN; Technician: Gwenlyn Perking [place], [place]; Respiratory Therapist: Leonides Schanz, RRT; Registered Nurse: Doylene Canard, RN; Technician: Christella Scheuermann, NT; Registered Nurse: Clent Ridges, RN; Technician: Timmothy Sours, [place]; Dietitian: Marshall Cork, RD; Registered Nurse: Romeo Rabon, RN; Respiratory Therapist: Fontaine No, RRT; Technician: Gala Murdoch, NT   LOS: 5 days   5 Days Post-Op  Procedure(s): LAPAROSCOPIC COLOSTOMY TAKEDOWN CYSTOSCOPY WITH STENT REPLACEMENT REMOVAL PORT-A-CATH  Subjective:  No major events Walking more.  Wants solid food - ordered but not given yet  Objective:  Vital signs:  Temp:  [98.4 F (36.9 C)-99.2 F (37.3 C)] 99.2 F (37.3 C) (11/21 0200) Pulse Rate:  [96-107] 96  (11/21 0200) Resp:  [18] 18  (11/21 0200) BP: (127-155)/(73-86) 149/86 mmHg (11/21 0200) SpO2:  [96 %-99 %] 96 % (11/21 0200) Last BM Date: 12/24/10  Intake/Output    from previous day: 11/20 0701 - 11/21 0700 In: 720 [P.O.:720] Out: 4485 [Urine:2225; Drains:360; Stool:1900]  this shift: Total I/O In: 360 [P.O.:360] Out: 2875 [Urine:1475; Drains:200; Stool:1200]  Flatus: yes BM: liquid in ileostomy  Physical Exam:  General: Pt awake/alert/oriented x4 in no acute distress Eyes: PERRL, normal EOM.  Sclera clear.  No icterus Neuro: CN II-XII intact w/o focal sensory/motor deficits. Lymph: No head/neck/groin  lymphadenopathy Psych:  No delerium/psychosis/paranoia HENT: Normocephalic, Mucus membranes moist.  No thrush Neck: Supple, No tracheal deviation Chest: Clear lungs. No pain w good excursion CV:  Pulses intact.  Regular rhythm Abdomen: Soft, Nontender/Nondistended.  No incarcerated hernias.  RN claims bleeding around RLQ drain - I saw none, but edema leaking.  Wick in incisions - I removed Ext:  SCDs BLE.  No mjr edema.  No cyanosis Skin: No petechiae / purpurae  Results:   Labs: Results for orders placed during the hospital encounter of 12/20/10 (from the past 48 hour(s))  HEMOGLOBIN     Status: Abnormal   Collection Time   12/24/10  9:07 AM      Component Value Range Comment   Hemoglobin 8.6 (*) 13.0 - 17.0 (g/dL)   POTASSIUM     Status: Normal   Collection Time   12/24/10  9:07 AM      Component Value Range Comment   Potassium 3.6  3.5 - 5.1 (mEq/L)   CREATININE, SERUM     Status: Abnormal   Collection Time   12/24/10  9:07 AM      Component Value Range Comment   Creatinine, Ser 1.84 (*) 0.50 - 1.35 (mg/dL)    GFR calc non Af Amer 35 (*) >90 (mL/min)    GFR calc Af Amer 41 (*) >90 (mL/min)   POTASSIUM     Status: Normal   Collection Time   12/25/10  4:05 AM      Component Value Range Comment   Potassium 3.8  3.5 - 5.1 (mEq/L)   HEMOGLOBIN     Status: Abnormal   Collection Time   12/25/10  4:05 AM      Component Value Range Comment   Hemoglobin 8.4 (*) 13.0 -  17.0 (g/dL)   CREATININE, SERUM     Status: Abnormal   Collection Time   12/25/10  4:05 AM      Component Value Range Comment   Creatinine, Ser 1.96 (*) 0.50 - 1.35 (mg/dL)    GFR calc non Af Amer 33 (*) >90 (mL/min)    GFR calc Af Amer 38 (*) >90 (mL/min)     Imaging / Studies: @RISRSLT24 @  Antibiotics: Anti-infectives     Start     Dose/Rate Route Frequency Ordered Stop   12/20/10 0600   ertapenem (INVANZ) 1 g in sodium chloride 0.9 % 50 mL IVPB  Status:  Discontinued        1 g 100 mL/hr over 30  Minutes Intravenous On call to O.R. 12/19/10 1922 12/21/10 0025          Medications / Allergies: per chart  Assessment / Plan: Eric Munoz  70 y.o. male  Procedure(s): LAPAROSCOPIC COLOSTOMY TAKEDOWN CYSTOSCOPY WITH STENT REPLACEMENT REMOVAL PORT-A-CATH  Problem List:  Principal Problem:  *History of colon cancer, stage IV, Stage 4, [id number] (Chem last Feb12/ Radiation-last Jan12)  -no ev of recurrent disease  Active Problems:  History of adenomatous polyp of rectum  Ureteral stricture, left  -continue stent  Acute renal failure (ARF), resolving  -inc PO IVF  -add antidiarrheals with O>>I  -follow CR .  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  D/w RNs  Postoperative anemia due to acute blood loss  -stop heparin and follow -VTE prophylaxis- SCDs, etc -mobilize as tolerated to help recovery -poss D/C in 1-2 days if improved  Lorenso Courier, M.D., F.A.C.S. Gastrointestinal and Minimally Invasive Surgery [facility] [address] [place], [place] [address] [phone] Main / Paging [phone] Voice Mail   12/25/2010

## 2010-12-25 NOTE — Consult Note (Signed)
WOC ostomy consult  Stoma type/location: right middle quadrant loop ileostomy (temporary) Stomal assessment/size: 1 and 3/4 inches round, moist, red, functioning stoma Peristomal assessment:clear, intact Treatment options for stomal/peristomal skin: none Output liquid brown stool Ostomy pouching: 2pc. System, 2 and 1/4 inch Education provided: patient taught differences between ileostomy and colostomy-specifically, dietary considerations to prevent food blockage, need to drink plenty of fluids to prevent dehydration.  Review of pouching and stoma characteristics.  Questions answered for patient and wife about supply.  5 pouching set-ups sent home with patient. Patient aware to contact WOC Nurse if problems arise post discharge.  Ready for discharge from ostomy teaching perspective. Please re-consult if needed. Thanks, Ladona Mow, MSN, RN, [facility], [facility]N [phone])

## 2010-12-26 ENCOUNTER — Other Ambulatory Visit (INDEPENDENT_AMBULATORY_CARE_PROVIDER_SITE_OTHER): Payer: Self-pay | Admitting: Surgery

## 2010-12-26 DIAGNOSIS — Z923 Personal history of irradiation: Secondary | ICD-10-CM

## 2010-12-26 DIAGNOSIS — Z9889 Other specified postprocedural states: Secondary | ICD-10-CM

## 2010-12-26 LAB — HEMOGLOBIN: Hemoglobin: 8.3 g/dL — ABNORMAL LOW (ref 13.0–17.0)

## 2010-12-26 LAB — CREATININE, SERUM
Creatinine, Ser: 2.03 mg/dL — ABNORMAL HIGH (ref 0.50–1.35)
GFR calc Af Amer: 37 mL/min — ABNORMAL LOW (ref >90)
GFR calc non Af Amer: 32 mL/min — ABNORMAL LOW (ref >90)

## 2010-12-26 MED ORDER — OXYCODONE HCL 5 MG PO TABS: 5.0000 mg | ORAL_TABLET | ORAL | Status: AC | PRN

## 2010-12-26 MED ORDER — LOPERAMIDE HCL 2 MG PO CAPS: 2.0000 mg | ORAL_CAPSULE | Freq: Every day | ORAL | Status: AC

## 2010-12-26 NOTE — Discharge Summary (Signed)
Physician Discharge Summary  Patient ID: FREAD KOTTKE MRN: [id number] DOB/AGE: [birth year] 70 y.o.  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 He stabilized and was able to be transferred to the floor soon. He was mobilized. He began to have ileostomy output. He was transitioned from IV medications to oral medications. Place on an anti--diarrheal regimen to avoid dehydration. His creatinine did increase to around 2 but stayed stable. He was not oliguric.  By the time of discharge, he was walking on the hallways, had improved ileostomy output on dismissed and loperamide. He had good oral pain control. He received training on a leg bag, ileostomy, pelvic drain care.  Based on improvements he and his wife that it would be safe to be discharged home. We will follow him closely  Consults: urology  Significant Diagnostic Studies:   Treatments: surgery: Colostomy takedown, bladder repair with Foley, diverting loop ileostomy  Discharge Exam: Blood pressure 109/73, pulse 96, temperature 98.6 F (37 C), temperature source Oral, resp. rate 20, height 5\' 10"  (1.778 m), weight 177 lb 14.6  oz (80.7 kg), SpO2 96.00%.  General: Pt awake/alert/oriented x4 in no major acute distress Eyes: PERRL, normal EOM. Neuro: CN II-XII intact w/o focal sensory/motor deficits. Lymph: No head/neck/groin lymphadenopathy Psych:  No delerium/psychosis/paranoia HEENT: Normocephalic, Mucus membranes moist.  No thrush Neck: Supple, No tracheal deviation Chest: No pain w good excursion CV:  Pulses intact.  Regular rhythm Abdomen: soft, nontender/nondistended.  No incarcerated hernias. Incisions c/d/i.  Drain serous fluid with mild leaking at skin.  Foley in place Ext:  SCDs BLE.  No mjr edema.  No cyanosis Skin: No petechiae / purpurae   Disposition: Home or Self Care  Discharge Orders    Future Appointments: Provider: Department: Dept Phone: Center:   02/07/2011 10:00 AM Leighton Ruff Womack [facility] [phone] None   02/07/2011 10:30 AM Samul Dada, MD [facility] [phone] None     Future Orders Please Complete By Expires   Ambulatory referral to Home Health      Comments:   Please evaluate [name] for admission to [facility].  Disciplines requested:   Services to provide:   1.  Ileostomy care/training 2.  Drain care [facility] bulb pelvic drain) 3.  Leg bag care/teaching  Physician to follow patient's care (the person listed here will be responsible for signing ongoing orders): Referring Provider  Requested Start of Care Date: Tomorrow  Special Instructions:     Diet - low sodium heart healthy      Comments:   Drink > 2 quarts of fluid a day   Increase activity slowly      Discharge instructions      Comments:   ABDOMINAL SURGERY: POST OP INSTRUCTIONS  DIET: Follow a light  bland diet the first 24 hours after arrival home, such as soup, liquids, crackers, etc.  Be sure to include lots of fluids daily.  Avoid fast food or heavy meals as your are more likely to get nauseated.  Eat a low fat the next few days after surgery.   Take your usually prescribed home  medications unless otherwise directed. PAIN CONTROL: Pain is best controlled by a usual combination of three different methods TOGETHER: Ice/Heat Over the counter pain medication Prescription pain medication Most patients will experience some swelling and bruising around the incisions.  Ice packs or heating pads (30-60 minutes up to 6 times a day) will help. Use ice for the first few days to help decrease swelling and bruising, then switch to heat to help relax tight/sore spots and speed recovery.  Some people prefer to use ice alone, heat alone, alternating between ice & heat.  Experiment to what works for you.  Swelling and bruising can take several weeks to resolve.   It is helpful to take an over-the-counter pain medication regularly for the first few weeks.  Choose one of the following that works best for you: Naproxen (Aleve, etc)  Two 220mg  tabs twice a day Ibuprofen (Advil, etc) Three 200mg  tabs four times a day (every meal & bedtime) Acetaminophen (Tylenol, etc) 500-650mg  four times a day (every meal & bedtime) A  prescription for pain medication (such as oxycodone, hydrocodone, etc) should be given to you upon discharge.  Take your pain medication as prescribed.  If you are having problems/concerns with the prescription medicine (does not control pain, nausea, vomiting, rash, itching, etc), please call us [phone] to see if we need to switch you to a different pain medicine that will work better for you and/or control your side effect better. If you need a refill on your pain medication, please contact your pharmacy.  They will contact our office to request authorization. Prescriptions will not be filled after 5 pm or on week-ends. Avoid getting constipated.  Between the surgery and the pain medications, it is common to experience some constipation.  Increasing fluid intake and taking a fiber supplement (such as Metamucil, Citrucel, FiberCon, MiraLax, etc) 1-2 times a day regularly will  usually help prevent this problem from occurring.  A mild laxative (prune juice, Milk of Magnesia, MiraLax, etc) should be taken according to package directions if there are no bowel movements after 48 hours.   Watch out for diarrhea.  If you have many loose bowel movements, simplify your diet to bland foods & liquids for a few days.  Stop any stool softeners and decrease your fiber supplement.  Switching to mild anti-diarrheal medications (Kayopectate, Pepto Bismol) can help.  If this worsens or does not improve, please call us. Wash / shower every day.  You may shower over the incision / wound.  Avoid baths until the skin is fully healed.  Continue to shower over incision(s) after the dressing is off. Remove your waterproof bandages 5 days after surgery.  You may leave the incision open to air.  You may replace a dressing/Band-Aid to cover the incision for comfort if you wish. ACTIVITIES as tolerated:   You may resume regular (light) daily activities beginning the next day-such as daily self-care, walking, climbing stairs-gradually increasing activities as tolerated.  If you can walk 30 minutes without difficulty, it is safe to try more intense activity such as jogging, treadmill, bicycling, low-impact aerobics, swimming, etc. Save the most intensive and strenuous activity for  last such as sit-ups, heavy lifting, contact sports, etc  Refrain from any heavy lifting or straining until you are off narcotics for pain control.   DO NOT PUSH THROUGH PAIN.  Let pain be your guide: If it hurts to do something, don't do it.  Pain is your body warning you to avoid that activity for another week until the pain goes down. You may drive when you are no longer taking prescription pain medication, you can comfortably wear a seatbelt, and you can safely maneuver your car and apply brakes. You may have sexual intercourse when it is comfortable.  FOLLOW UP in our office Please call [facility] at [phone] to set up an  appointment to see your surgeon in the office for a follow-up appointment approximately 1-2 weeks after your surgery. Make sure that you call for this appointment the day you arrive home to insure a convenient appointment time. 10. IF YOU HAVE DISABILITY OR FAMILY LEAVE FORMS, BRING THEM TO THE OFFICE FOR PROCESSING.  DO NOT GIVE THEM TO YOUR DOCTOR.   WHEN TO CALL us [phone]: Poor pain control Reactions / problems with new medications (rash/itching, nausea, etc)  Fever over 101.5 F (38.5 C) Inability to urinate Nausea and/or vomiting Worsening swelling or bruising Continued bleeding from incision. Increased pain, redness, or drainage from the incision  The clinic staff is available to answer your questions during regular business hours (8:30am-5pm).  Please don't hesitate to call and ask to speak to one of our nurses for clinical concerns.   A surgeon from [facility] is always on call at the hospitals   If you have a medical emergency, go to the nearest emergency room or call 911.    [facility] [address], [place], [place]  [address] ? MAIN: [phone] ? TOLL FREE: [phone] ? FAX [phone] [email or web address]    Change dressing (specify)      Comments:   Dressing change: once times a day using clean 4x4 gauze around the drain site   Call MD for:  temperature >100.4      Call MD for:  persistant nausea and vomiting      Call MD for:  severe uncontrolled pain      Call MD for:  redness, tenderness, or signs of infection (pain, swelling, redness, odor or green/yellow discharge around incision site)      Call MD for:  persistant dizziness or light-headedness      Call MD for:  extreme fatigue      Call MD for:  difficulty breathing, headache or visual disturbances      Call MD for:  hives        Current Discharge Medication List    START taking these medications   Details  loperamide (IMODIUM)  2 MG capsule Take 1 capsule (2 mg total) by mouth daily with breakfast. Qty: 40 capsule, Refills: 3    oxyCODONE (OXY IR/ROXICODONE) 5 MG immediate release tablet Take 1-2 tablets (5-10 mg total) by mouth every 4 (four) hours as needed. Qty: 50 tablet, Refills: 0      CONTINUE these medications which have NOT CHANGED   Details  Multiple Vitamin (MULTIVITAMIN PO) Take by mouth daily.           Signed: Margaree Sandhu C. 12/26/2010, 12:23 PM

## 2010-12-28 ENCOUNTER — Emergency Department (HOSPITAL_COMMUNITY)
Admission: EM | Admit: 2010-12-28 | Discharge: 2010-12-29 | Disposition: A | Discharge status: Home / Self Care | Payer: BC Managed Care – PPO | Attending: Emergency Medicine | Admitting: Emergency Medicine

## 2010-12-28 ENCOUNTER — Encounter (HOSPITAL_COMMUNITY): Payer: Self-pay | Admitting: *Deleted

## 2010-12-28 DIAGNOSIS — Z85038 Personal history of other malignant neoplasm of large intestine: Secondary | ICD-10-CM | POA: Insufficient documentation

## 2010-12-28 DIAGNOSIS — R339 Retention of urine, unspecified: Secondary | ICD-10-CM | POA: Insufficient documentation

## 2010-12-28 DIAGNOSIS — Z9889 Other specified postprocedural states: Secondary | ICD-10-CM | POA: Insufficient documentation

## 2010-12-28 DIAGNOSIS — R109 Unspecified abdominal pain: Secondary | ICD-10-CM | POA: Insufficient documentation

## 2010-12-28 LAB — POCT I-STAT, CHEM 8
Calcium, Ion: 1.16 mmol/L (ref 1.12–1.32)
Creatinine, Ser: 2.2 mg/dL — ABNORMAL HIGH (ref 0.50–1.35)
Glucose, Bld: 121 mg/dL — ABNORMAL HIGH (ref 70–99)
Hemoglobin: 8.2 g/dL — ABNORMAL LOW (ref 13.0–17.0)
Potassium: 4.1 mEq/L (ref 3.5–5.1)
TCO2: 22 mmol/L (ref 0–100)

## 2010-12-28 NOTE — ED Provider Notes (Signed)
History     CSN: [id number] Arrival date & time: 12/28/2010 10:51 PM   First MD Initiated Contact with Patient 12/28/10 2300      Chief Complaint  Patient presents with  . Urinary Retention    (Consider location/radiation/quality/duration/timing/severity/associated sxs/prior treatment) Patient is a 70 y.o. male presenting with abdominal pain. The history is provided by the patient.  Abdominal Pain The primary symptoms of the illness include abdominal pain. The primary symptoms of the illness do not include fever, shortness of breath, vomiting or dysuria. The current episode started 3 to 5 hours ago. The onset of the illness was gradual. The problem has been gradually worsening.  Associated with: Recent surgery with Foley in place and now is not draining urine. The patient has not had a change in bowel habit. Risk factors for an acute abdominal problem include being elderly and a history of abdominal surgery. Additional symptoms associated with the illness include urgency. Symptoms associated with the illness do not include chills, anorexia, diaphoresis, heartburn, constipation, hematuria, frequency or back pain. Significant associated medical issues do not include diabetes.   patient discharge from the hospital 3 days ago status post reversal of colostomy. He had some urinary tension at home and home health nurse placed a 16 French foley. This is working for a 24 hours and then patient noticed Foley no longer draining urine and having increasing urgency with suprapubic discomfort. Patient called a surgeon who informed him to come the emergency apartment and have a larger Foley catheter placed. No fevers chills. No radiation of dull progressive constant pain. No alleviating factors. Worse with palpation in that area. Moderate to severe.  Past Medical History  Diagnosis Date  . Easy bruising   . 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 Difficult intubation     Past Surgical History  Procedure Date  . Colon surgery 08/07/09    Low Anterior Rescection, resection of L ext iliac artery,  ureterolysis  . Portacath placement 12-11-10    09-17-09 right chest/ states will be removed 12-20-10  . Colostomy takedown 12/20/2010    Procedure: LAPAROSCOPIC COLOSTOMY TAKEDOWN;  Surgeon: [name], MD;  Location: [facility];  Service: General;  Laterality: N/A;  Laparoscopy Colostomy Takedownn With Ileostomy  . Port-a-cath removal 12/20/2010    Procedure: REMOVAL PORT-A-CATH;  Surgeon: [name], MD;  Location: [facility];  Service: General;  Laterality: Right;  Right  venous catheter no longer needed.  . Cystoscopy w/ ureteral stent placement 12/20/2010    Procedure: CYSTOSCOPY WITH STENT REPLACEMENT;  Surgeon: [name];  Location: [facility];  Service: Urology;  Laterality: Left;  Cystoscopy/Left Double J Stent Exchange  . Tonsillectomy   . Fracture surgery     Family History  Problem Relation Age of Onset  . Stroke Father 60    History  Substance Use Topics  . Smoking status: Former Games developer  . Smokeless tobacco: Never Used  . Alcohol Use: No      Review of Systems  Constitutional: Negative for fever, chills and diaphoresis.  HENT: Negative for neck pain and neck stiffness.   Eyes: Negative for pain.  Respiratory: Negative for shortness of breath.   Cardiovascular: Negative for chest pain and palpitations.  Gastrointestinal: Positive for abdominal pain. Negative for heartburn, vomiting, constipation and  anorexia.  Genitourinary: Positive for urgency. Negative for dysuria, frequency and hematuria.  Musculoskeletal: Negative for back pain.  Skin: Negative for rash.  Neurological: Negative for headaches.  All other systems reviewed and are negative.    Allergies    Review of patient's allergies indicates no known allergies.  Home Medications   Current Outpatient Rx  Name Route Sig Dispense Refill  . LOPERAMIDE HCL 2 MG PO CAPS Oral Take 1 capsule (2 mg total) by mouth daily with breakfast. 40 capsule 3  . MULTIVITAMIN PO Oral Take by mouth daily.      . OXYCODONE HCL 5 MG PO TABS Oral Take 1-2 tablets (5-10 mg total) by mouth every 4 (four) hours as needed. 50 tablet 0    BP 120/61  Pulse 104  Temp(Src) 98.2 F (36.8 C) (Oral)  Resp 18  SpO2 100%  Physical Exam  Constitutional: He is oriented to person, place, and time. He appears well-developed and well-nourished.  HENT:  Head: Normocephalic and atraumatic.  Eyes: Conjunctivae and EOM are normal. Pupils are equal, round, and reactive to light.  Neck: Trachea normal. Neck supple. No thyromegaly present.  Cardiovascular: Normal rate, regular rhythm, S1 normal, S2 normal and normal pulses.     No systolic murmur is present   No diastolic murmur is present  Pulses:      Radial pulses are 2+ on the right side, and 2+ on the left side.  Pulmonary/Chest: Effort normal and breath sounds normal. He has no wheezes. He has no rhonchi. He has no rales. He exhibits no tenderness.  Abdominal: Soft. Normal appearance and bowel sounds are normal. There is no CVA tenderness and negative Murphy's sign.       Suprapubic fullness and mild suprapubic discomfort. Otherwise abdomen is soft and nontender.  Genitourinary:       Foley in place  Musculoskeletal:       BLE:s Calves nontender, no cords or erythema, negative Homans sign  Neurological: He is alert and oriented to person, place, and time. He has normal strength. No cranial nerve deficit or sensory deficit. GCS eye subscore is 4. GCS verbal subscore is 5. GCS motor subscore is 6.  Skin: Skin is warm and dry. No rash noted. He is not diaphoretic.  Psychiatric: His speech is normal.       Cooperative and appropriate    ED Course  Procedures  (including critical care time)  Foley catheter replaced by RN from 55 [place] to 31 [place] with immediate urine output.  Results for orders placed during the hospital encounter of 12/28/10  URINALYSIS, ROUTINE W REFLEX MICROSCOPIC      Component Value Range   Color, Urine YELLOW  YELLOW    Appearance CLOUDY (*) CLEAR    Specific Gravity, Urine 1.016  1.005 - 1.030    pH 6.0  5.0 - 8.0    Glucose, UA NEGATIVE  NEGATIVE (mg/dL)   Hgb urine dipstick LARGE (*) NEGATIVE    Bilirubin Urine NEGATIVE  NEGATIVE    Ketones, ur NEGATIVE  NEGATIVE (mg/dL)   Protein, ur 161 (*) NEGATIVE (mg/dL)   Urobilinogen, UA 0.2  0.0 - 1.0 (mg/dL)   Nitrite POSITIVE (*) NEGATIVE    Leukocytes, UA MODERATE (*) NEGATIVE   POCT I-STAT, CHEM 8      Component Value Range   Sodium 135  135 - 145 (mEq/L)   Potassium 4.1  3.5 - 5.1 (mEq/L)   Chloride 104  96 - 112 (mEq/L)  BUN 34 (*) 6 - 23 (mg/dL)   Creatinine, Ser 4.09 (*) 0.50 - 1.35 (mg/dL)   Glucose, Bld 811 (*) 70 - 99 (mg/dL)   Calcium, Ion 9.14  7.82 - 1.32 (mmol/L)   TCO2 22  0 - 100 (mmol/L)   Hemoglobin 8.2 (*) 13.0 - 17.0 (g/dL)   HCT 95.6 (*) 21.3 - 52.0 (%)  URINE MICROSCOPIC-ADD ON      Component Value Range   Squamous Epithelial / LPF RARE  RARE    WBC, UA TOO NUMEROUS TO COUNT  <3 (WBC/hpf)   RBC / HPF 11-20  <3 (RBC/hpf)   Bacteria, UA MANY (*) RARE    Urine-Other MUCOUS PRESENT          MDM   Urinary retention, resolved with replacement of larger Foley catheter. Metabolic panel and UA obtained and reviewed. Urine culture sent. Patient feeling much better with large urine amount output. Abx for NIT positive UA. U Cx pending.        Sunnie Nielsen, MD 12/29/10 [phone]

## 2010-12-28 NOTE — ED Notes (Signed)
Replaced foley catheter with a 20 fr foley with 155cc immediate return.

## 2010-12-28 NOTE — ED Notes (Signed)
Pt has an indwelling foley catheter due to an incident that occurred while the pt was having a reanastomosis performed.  Pt had urinary retention due to increased sediment yesterday for which he had his catheter replaced.  Today, pt experienced same symptoms of not being able to urinate through his foley due to increased sediment.  Home health nurse called the urologist on call (Dr Ezzard Standing) and was informed that the pt should have a 20 fr foley replace his current 16 fr foley.  The home health nurse did not have said 20 fr so [facility].  Pt last emptied his bladder around 1530.

## 2010-12-29 LAB — URINALYSIS, ROUTINE W REFLEX MICROSCOPIC
Bilirubin Urine: NEGATIVE
Ketones, ur: NEGATIVE mg/dL
Nitrite: POSITIVE — AB
Protein, ur: 100 mg/dL — AB
Urobilinogen, UA: 0.2 mg/dL (ref 0.0–1.0)

## 2010-12-29 LAB — URINE MICROSCOPIC-ADD ON

## 2010-12-29 MED ORDER — CIPROFLOXACIN HCL 500 MG PO TABS: 500.0000 mg | ORAL_TABLET | Freq: Two times a day (BID) | ORAL | Status: AC

## 2010-12-30 ENCOUNTER — Telehealth (INDEPENDENT_AMBULATORY_CARE_PROVIDER_SITE_OTHER): Payer: Self-pay | Admitting: Surgery

## 2010-12-30 NOTE — Telephone Encounter (Signed)
Pt had sx on 12/20/10, needs a po appt this week, please call.

## 2010-12-30 NOTE — Telephone Encounter (Signed)
Called pt to notify him that I have spoke to [facility] at Dr Dahlstedt's office and they are checking on getting an appt for the pt b/c they were not aware of the pt having a foley. Per the pt he must have some test done first before the foley is removed. The office will check on this and call the pt./ [facility]

## 2010-12-30 NOTE — Telephone Encounter (Signed)
Called pt to make sure that Dr Dahlstedt's office called pt and they did the pt was waiting for instructions from their office. We made f/u appt with Dr Michaell Cowing for this Wednesday.[facility]

## 2011-01-01 ENCOUNTER — Ambulatory Visit (INDEPENDENT_AMBULATORY_CARE_PROVIDER_SITE_OTHER): Payer: BC Managed Care – PPO | Admitting: Surgery

## 2011-01-01 ENCOUNTER — Encounter (INDEPENDENT_AMBULATORY_CARE_PROVIDER_SITE_OTHER): Payer: Self-pay | Admitting: Surgery

## 2011-01-01 DIAGNOSIS — Z86010 Personal history of colonic polyps: Secondary | ICD-10-CM

## 2011-01-01 DIAGNOSIS — Z932 Ileostomy status: Secondary | ICD-10-CM

## 2011-01-01 DIAGNOSIS — Z85038 Personal history of other malignant neoplasm of large intestine: Secondary | ICD-10-CM

## 2011-01-01 DIAGNOSIS — N179 Acute kidney failure, unspecified: Secondary | ICD-10-CM

## 2011-01-01 LAB — BASIC METABOLIC PANEL
CO2: 19 mEq/L (ref 19–32)
Chloride: 102 mEq/L (ref 96–112)
Sodium: 134 mEq/L — ABNORMAL LOW (ref 135–145)

## 2011-01-01 NOTE — Progress Notes (Signed)
Subjective:     Patient ID: Eric Munoz, male   DOB: 05-25-1940, 70 y.o.   MRN: [id number]  HPI  Eric Munoz  1940-12-24 [id number]  Patient Care Team: [name] as PCP - General (Family Medicine) [name] as Consulting Physician (Urology) [name], MD as Consulting Physician (Radiation Oncology) [name] as Consulting Physician (Gastroenterology) Samul Dada, MD as Consulting Physician (Hematology and Oncology)  This patient is a 70 y.o.male who presents today for surgical evaluation.   Diagnosis: Metastatic colon cancer status post resection and colostomy  Procedure: Colostomy takedown with protective loop ileostomy for coloanal anastomosis 12/20/2010.  The patient comes in today now postop day #12. He has seen urology. We have removed his catheter. He is voiding okay. They want him to stay on ciprofloxacin for a few more days. His drain output has been 25-50 mL a day. Clear.  He is entering the ileostomy about 3-4 times a day. He is using Imodium p.r.n. to help control it. He averages about 3-4 pills a day. His energy level is good. His appetite is good. He's going to drive. He is walking some. He comes today with his wife. No fevers chills or sweats. No nausea or vomiting.  Past Medical History  Diagnosis Date  . Easy bruising   . 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 Colon cancer 2007, recurrent Jul2011    Recurrent, Stage 4, [id number] (Left colectomy/LAR , ChemoTx completed Feb2012/ XRT completed Jan2012  . Difficult intubation     Past Surgical History  Procedure Date  . Colon surgery 08/07/09    Low Anterior Rescection, resection of L ext iliac artery,  ureterolysis  . Portacath placement 12-11-10    09-17-09 right chest/ states will be removed 12-20-10  . Colostomy takedown 12/20/2010    Procedure: LAPAROSCOPIC COLOSTOMY TAKEDOWN;  Surgeon: [name], MD;  Location: [facility];  Service: General;  Laterality: N/A;  Laparoscopy Colostomy Takedownn With Ileostomy  . Port-a-cath removal 12/20/2010    Procedure: REMOVAL PORT-A-CATH;  Surgeon: [name], MD;  Location: [facility];  Service: General;  Laterality: Right;  Right  venous catheter no longer needed.  . Cystoscopy w/ ureteral stent placement 12/20/2010    Procedure: CYSTOSCOPY WITH STENT REPLACEMENT;  Surgeon: [name];  Location: [facility];  Service: Urology;  Laterality: Left;  Cystoscopy/Left Double J Stent Exchange  . Tonsillectomy   . Fracture surgery     History   Social History  . Marital Status: Married    Spouse Name: N/A    Number of Children: N/A  . Years of Education: N/A   Occupational History  . Not on file.   Social History Main Topics  . Smoking status: Former Games developer  . Smokeless tobacco: Never Used  . Alcohol Use: No  . Drug Use: No  . Sexually Active: Yes   Other Topics Concern  . Not on file   Social History Narrative  . No narrative on file    Family History  Problem Relation Age of Onset  . Stroke Father 71    Current outpatient prescriptions:ciprofloxacin (CIPRO) 500 MG tablet, Take 1 tablet (500 mg total) by mouth 2 (two) times daily., Disp: 14 tablet, Rfl: 0;  loperamide (IMODIUM) 2 MG capsule, Take 1 capsule (2 mg total) by mouth daily with breakfast., Disp: 40 capsule, Rfl: 3;  Multiple Vitamin (MULTIVITAMIN PO), Take by mouth  daily.  , Disp: , Rfl:  oxyCODONE (OXY IR/ROXICODONE) 5 MG immediate release tablet, Take 1-2 tablets (5-10 mg total) by mouth every 4 (four) hours as needed., Disp: 50 tablet, Rfl: 0  No Known Allergies  BP 128/72  Pulse 68  Temp(Src) 97.7 F (36.5 C) (Temporal)  Resp 18  Ht 5\' 10"  (1.778 m)  Wt 153 lb 4 oz (69.514 kg)  BMI 21.99 kg/m2     Review of Systems  Constitutional: Negative for fever, chills and diaphoresis.  HENT: Negative for sore throat, trouble swallowing and neck pain.   Eyes: Negative for photophobia  and visual disturbance.  Respiratory: Negative for choking and shortness of breath.   Cardiovascular: Negative for chest pain and palpitations.  Gastrointestinal: Negative for nausea, vomiting, abdominal distention, anal bleeding and rectal pain.  Genitourinary: Negative for dysuria, urgency, difficulty urinating and testicular pain.  Musculoskeletal: Negative for myalgias, arthralgias and gait problem.  Skin: Negative for color change and rash.  Neurological: Negative for dizziness, speech difficulty, weakness and numbness.  Hematological: Negative for adenopathy.  Psychiatric/Behavioral: Negative for hallucinations, confusion and agitation.       Objective:   Physical Exam  Constitutional: He is oriented to person, place, and time. He appears well-developed and well-nourished. No distress.  HENT:  Head: Normocephalic.  Mouth/Throat: Oropharynx is clear and moist. No oropharyngeal exudate.  Eyes: Conjunctivae and EOM are normal. Pupils are equal, round, and reactive to light. No scleral icterus.  Neck: Normal range of motion. No tracheal deviation present.  Cardiovascular: Normal rate, normal heart sounds and intact distal pulses.   Pulmonary/Chest: Effort normal. No respiratory distress.  Abdominal: Soft. He exhibits no distension and no mass. There is no tenderness. There is no rebound and no guarding. Hernia confirmed negative in the right inguinal area and confirmed negative in the left inguinal area.       Incisions clean with normal healing ridges.  No hernias.  Loop ileostomy pink with thick brown succus.  RLQ drain serous - I removed  Genitourinary:       Foley out  Musculoskeletal: Normal range of motion. He exhibits no tenderness.  Neurological: He is alert and oriented to person, place, and time. No cranial nerve deficit. He exhibits normal muscle tone. Coordination normal.  Skin: Skin is warm and dry. No rash noted. He is not diaphoretic.  Psychiatric: He has a normal  mood and affect. His behavior is normal.       Assessment:     POD #12 s/p colostomy takedown w coloanal anastomosis & temporary protective loop ileostomy, recovering    Plan:     Despite his rough postop course with acute renal failure & Foley for bladder repair, I think he is making good strides. I'm hopeful he will continue to improve. I did educate he and his wife about if he has worsening pain, energy, fevers, nausea/vomiting; please call to make sure that he is not developing an abscess or a delayed leak.  Probably around 3 months if he has no problems and planned to do a loop ileostomy takedown. Perhaps do an enema preoperatively to make sure there no surprises or strictures. His pelvis was very tight at takedown.  CBC, BMET, nutrition labs to f/u ARF, anemia, etc  Increase activity as tolerated.  Do not push through pain.  Advanced on diet as tolerated. Bowel regimen to avoid problems.  Return to clinic 3 weeks. The patient expressed understanding and appreciation

## 2011-01-02 LAB — CBC WITH DIFFERENTIAL/PLATELET
Eosinophils Relative: 1 % (ref 0–5)
Hemoglobin: 8 g/dL — ABNORMAL LOW (ref 13.0–17.0)
Lymphocytes Relative: 5 % — ABNORMAL LOW (ref 12–46)
Lymphs Abs: 0.6 10*3/uL — ABNORMAL LOW (ref 0.7–4.0)
MCV: 92.5 fL (ref 78.0–100.0)
Monocytes Relative: 9 % (ref 3–12)
Neutrophils Relative %: 86 % — ABNORMAL HIGH (ref 43–77)
Platelets: 818 10*3/uL — ABNORMAL HIGH (ref 150–400)
RBC: 2.52 MIL/uL — ABNORMAL LOW (ref 4.22–5.81)
WBC: 13.8 10*3/uL — ABNORMAL HIGH (ref 4.0–10.5)

## 2011-01-03 ENCOUNTER — Telehealth (INDEPENDENT_AMBULATORY_CARE_PROVIDER_SITE_OTHER): Payer: Self-pay

## 2011-01-03 DIAGNOSIS — C189 Malignant neoplasm of colon, unspecified: Secondary | ICD-10-CM

## 2011-01-03 NOTE — Telephone Encounter (Signed)
Pt notified of labs showing hgb still a little low so pt should be taking a multivitamin with iron to help build the anemia up per Dr Michaell Cowing. Pt advised also that the creatnine still elevated so please be drinking plenty of fluids to keep from getting dehydrated per DR Michaell Cowing. The pt needs to get repeated labs next wk he will go to [facility] labs on Wednesday to get hgb,potassium, and creatnine. We will call him with those results/ [facility]

## 2011-01-03 NOTE — Telephone Encounter (Signed)
LMOM for pt to call me back to go over lab results with pt/ [facility]

## 2011-01-08 ENCOUNTER — Other Ambulatory Visit (INDEPENDENT_AMBULATORY_CARE_PROVIDER_SITE_OTHER): Payer: Self-pay

## 2011-01-08 ENCOUNTER — Other Ambulatory Visit (INDEPENDENT_AMBULATORY_CARE_PROVIDER_SITE_OTHER): Payer: Self-pay | Admitting: Surgery

## 2011-01-08 DIAGNOSIS — D649 Anemia, unspecified: Secondary | ICD-10-CM

## 2011-01-09 ENCOUNTER — Encounter (HOSPITAL_COMMUNITY): Payer: Self-pay | Admitting: Pharmacy Technician

## 2011-01-09 ENCOUNTER — Encounter (HOSPITAL_COMMUNITY): Payer: Self-pay | Admitting: *Deleted

## 2011-01-09 ENCOUNTER — Other Ambulatory Visit: Payer: Self-pay | Admitting: Urology

## 2011-01-09 LAB — HEMOGLOBIN: Hemoglobin: 8.9 g/dL — ABNORMAL LOW (ref 13.0–17.0)

## 2011-01-09 LAB — POTASSIUM: Potassium: 4.9 mEq/L (ref 3.5–5.3)

## 2011-01-09 NOTE — Pre-Procedure Instructions (Signed)
bmet 01-08-11 [facility] on chart

## 2011-01-13 ENCOUNTER — Encounter (HOSPITAL_COMMUNITY): Payer: Self-pay | Admitting: *Deleted

## 2011-01-13 ENCOUNTER — Ambulatory Visit (HOSPITAL_COMMUNITY)
Admission: RE | Admit: 2011-01-13 | Discharge: 2011-01-14 | Disposition: A | Discharge status: Home / Self Care | Payer: BC Managed Care – PPO | Source: Ambulatory Visit | Attending: Urology | Admitting: Urology

## 2011-01-13 ENCOUNTER — Encounter (HOSPITAL_COMMUNITY)
Admission: RE | Disposition: A | Discharge status: Home / Self Care | Payer: Self-pay | Source: Ambulatory Visit | Attending: Urology

## 2011-01-13 ENCOUNTER — Ambulatory Visit (HOSPITAL_COMMUNITY): Payer: BC Managed Care – PPO

## 2011-01-13 ENCOUNTER — Ambulatory Visit (HOSPITAL_COMMUNITY): Payer: BC Managed Care – PPO | Admitting: *Deleted

## 2011-01-13 DIAGNOSIS — Z85038 Personal history of other malignant neoplasm of large intestine: Secondary | ICD-10-CM | POA: Insufficient documentation

## 2011-01-13 DIAGNOSIS — N321 Vesicointestinal fistula: Secondary | ICD-10-CM | POA: Insufficient documentation

## 2011-01-13 DIAGNOSIS — N1330 Unspecified hydronephrosis: Secondary | ICD-10-CM | POA: Insufficient documentation

## 2011-01-13 DIAGNOSIS — N135 Crossing vessel and stricture of ureter without hydronephrosis: Secondary | ICD-10-CM | POA: Insufficient documentation

## 2011-01-13 HISTORY — PX: CYSTOSCOPY W/ URETERAL STENT PLACEMENT: SHX1429

## 2011-01-13 LAB — SURGICAL PCR SCREEN: MRSA, PCR: NEGATIVE

## 2011-01-13 LAB — CBC
HCT: 25.6 % — ABNORMAL LOW (ref 39.0–52.0)
Hemoglobin: 8.5 g/dL — ABNORMAL LOW (ref 13.0–17.0)
MCH: 28.2 pg (ref 26.0–34.0)
MCHC: 33.2 g/dL (ref 30.0–36.0)
RBC: 3.01 MIL/uL — ABNORMAL LOW (ref 4.22–5.81)

## 2011-01-13 SURGERY — CYSTOSCOPY, WITH RETROGRADE PYELOGRAM AND URETERAL STENT INSERTION
Anesthesia: General | Site: Ureter | Laterality: Left | Wound class: Clean Contaminated

## 2011-01-13 MED ORDER — ACETAMINOPHEN 10 MG/ML IV SOLN
1000.0000 mg | Freq: Four times a day (QID) | INTRAVENOUS | Status: DC | PRN
  Administered 2011-01-13: 1000 mg via INTRAVENOUS
  Filled 2011-01-13: qty 100

## 2011-01-13 MED ORDER — ONDANSETRON HCL 4 MG/2ML IJ SOLN: 4.0000 mg | INTRAMUSCULAR | Status: DC | PRN

## 2011-01-13 MED ORDER — IOHEXOL 300 MG/ML  SOLN
INTRAMUSCULAR | Status: AC
  Filled 2011-01-13: qty 1

## 2011-01-13 MED ORDER — STERILE WATER FOR IRRIGATION IR SOLN
Status: DC | PRN
  Administered 2011-01-13: 3000 mL

## 2011-01-13 MED ORDER — LIDOCAINE HCL 1 % IJ SOLN
INTRAMUSCULAR | Status: DC | PRN
  Administered 2011-01-13: 40 mg via INTRADERMAL

## 2011-01-13 MED ORDER — IOHEXOL 300 MG/ML  SOLN
15.0000 mL | Freq: Once | INTRAMUSCULAR | Status: AC | PRN
  Administered 2011-01-13: 15 mL

## 2011-01-13 MED ORDER — PROMETHAZINE HCL 25 MG/ML IJ SOLN: 6.2500 mg | INTRAMUSCULAR | Status: DC | PRN

## 2011-01-13 MED ORDER — FENTANYL CITRATE 0.05 MG/ML IJ SOLN
INTRAMUSCULAR | Status: DC | PRN
  Administered 2011-01-13 (×2): 50 ug via INTRAVENOUS

## 2011-01-13 MED ORDER — CEFAZOLIN SODIUM 1-5 GM-% IV SOLN
INTRAVENOUS | Status: AC
  Filled 2011-01-13: qty 50

## 2011-01-13 MED ORDER — CIPROFLOXACIN HCL 250 MG PO TABS
250.0000 mg | ORAL_TABLET | Freq: Two times a day (BID) | ORAL | Status: DC
  Administered 2011-01-13 – 2011-01-14 (×3): 250 mg via ORAL
  Filled 2011-01-13 (×6): qty 1

## 2011-01-13 MED ORDER — IOHEXOL 300 MG/ML  SOLN
INTRAMUSCULAR | Status: DC | PRN
  Administered 2011-01-13: 2 mL

## 2011-01-13 MED ORDER — MIDAZOLAM HCL 5 MG/5ML IJ SOLN
INTRAMUSCULAR | Status: AC | PRN
  Administered 2011-01-13: 1 mg via INTRAVENOUS

## 2011-01-13 MED ORDER — SODIUM CHLORIDE 0.9 % IR SOLN: Status: DC | PRN

## 2011-01-13 MED ORDER — HYDROMORPHONE HCL PF 1 MG/ML IJ SOLN: 0.2500 mg | INTRAMUSCULAR | Status: DC | PRN

## 2011-01-13 MED ORDER — FENTANYL CITRATE 0.05 MG/ML IJ SOLN
INTRAMUSCULAR | Status: AC | PRN
  Administered 2011-01-13: 50 ug via INTRAVENOUS

## 2011-01-13 MED ORDER — SODIUM CHLORIDE 0.45 % IV SOLN
INTRAVENOUS | Status: DC
  Administered 2011-01-13 – 2011-01-14 (×2): via INTRAVENOUS

## 2011-01-13 MED ORDER — LOPERAMIDE HCL 2 MG PO CAPS
2.0000 mg | ORAL_CAPSULE | Freq: Two times a day (BID) | ORAL | Status: DC
  Administered 2011-01-13 – 2011-01-14 (×3): 2 mg via ORAL
  Filled 2011-01-13 (×2): qty 1
  Filled 2011-01-13: qty 2
  Filled 2011-01-13 (×3): qty 1

## 2011-01-13 MED ORDER — MIDAZOLAM HCL 5 MG/5ML IJ SOLN
INTRAMUSCULAR | Status: DC | PRN
  Administered 2011-01-13: 1 mg via INTRAVENOUS

## 2011-01-13 MED ORDER — SODIUM CHLORIDE 0.9 % IV SOLN
INTRAVENOUS | Status: DC | PRN
  Administered 2011-01-13: 07:00:00 via INTRAVENOUS

## 2011-01-13 MED ORDER — LIDOCAINE HCL 1 % IJ SOLN
INTRAMUSCULAR | Status: AC
  Filled 2011-01-13: qty 20

## 2011-01-13 MED ORDER — DIATRIZOATE MEGLUMINE 30 % UR SOLN
URETHRAL | Status: DC | PRN
  Administered 2011-01-13: 300 mL

## 2011-01-13 MED ORDER — PROPOFOL 10 MG/ML IV BOLUS
INTRAVENOUS | Status: DC | PRN
  Administered 2011-01-13: 180 mg via INTRAVENOUS

## 2011-01-13 MED ORDER — MUPIROCIN 2 % EX OINT
TOPICAL_OINTMENT | CUTANEOUS | Status: AC
  Filled 2011-01-13: qty 22

## 2011-01-13 MED ORDER — CEFAZOLIN SODIUM 1-5 GM-% IV SOLN
1.0000 g | INTRAVENOUS | Status: AC
Start: 2011-01-13 — End: 2011-01-13
  Administered 2011-01-13: 1 g via INTRAVENOUS

## 2011-01-13 SURGICAL SUPPLY — 18 items
ADAPTER CATH URET PLST 4-6FR (CATHETERS) ×2 IMPLANT
ADPR CATH URET STRL DISP 4-6FR (CATHETERS) ×1
BAG URO CATCHER STRL LF (DRAPE) ×2 IMPLANT
CATH INTERMIT  6FR 70CM (CATHETERS) ×1 IMPLANT
CATH ROBINSON RED A/P 16FR (CATHETERS) ×1 IMPLANT
CATH TIEMANN FOLEY 18FR 5CC (CATHETERS) ×1 IMPLANT
CATH URET WHISTLE 6FR (CATHETERS) IMPLANT
CLOTH BEACON ORANGE TIMEOUT ST (SAFETY) ×2 IMPLANT
CYSTOGRAFIN 30% 250ML (MISCELLANEOUS) ×1 IMPLANT
DRAPE CAMERA CLOSED 9X96 (DRAPES) ×2 IMPLANT
GLOVE BIOGEL M 8.0 STRL (GLOVE) ×2 IMPLANT
GOWN PREVENTION PLUS XLARGE (GOWN DISPOSABLE) ×2 IMPLANT
GOWN STRL REIN XL XLG (GOWN DISPOSABLE) ×2 IMPLANT
GUIDEWIRE STR DUAL SENSOR (WIRE) ×1 IMPLANT
MANIFOLD NEPTUNE II (INSTRUMENTS) ×2 IMPLANT
PACK CYSTO (CUSTOM PROCEDURE TRAY) ×2 IMPLANT
STENT CONTOUR 7FRX26X.038 (STENTS) ×1 IMPLANT
TUBING CONNECTING 10 (TUBING) ×1 IMPLANT

## 2011-01-13 NOTE — Anesthesia Preprocedure Evaluation (Signed)
Anesthesia Evaluation  Patient identified by MRN, date of birth, ID band Patient awake  General Assessment Comment:Pt denies hx of anesthesia problems  Reviewed: Allergy & Precautions, H&P , NPO status , Patient's Chart, lab work & pertinent test results, reviewed documented beta blocker date and time   Airway Mallampati: II TM Distance: >3 FB Neck ROM: Full    Dental   Pulmonary neg pulmonary ROS,  clear to auscultation        Cardiovascular neg cardio ROS Regular Normal Denies cardiac symptoms   Neuro/Psych Negative Neurological ROS  Negative Psych ROS   GI/Hepatic negative GI ROS, Neg liver ROS,   Endo/Other  Anemia  Renal/GU Bilateral hydronephrosis, Cr 2.04  Genitourinary negative   Musculoskeletal negative musculoskeletal ROS (+)   Abdominal   Peds negative pediatric ROS (+)  Hematology negative hematology ROS (+)   Anesthesia Other Findings   Reproductive/Obstetrics negative OB ROS                           Anesthesia Physical Anesthesia Plan  ASA: III  Anesthesia Plan: General   Post-op Pain Management:    Induction: Intravenous  Airway Management Planned: LMA  Additional Equipment:   Intra-op Plan:   Post-operative Plan: Extubation in OR  Informed Consent: I have reviewed the patients History and Physical, chart, labs and discussed the procedure including the risks, benefits and alternatives for the proposed anesthesia with the patient or authorized representative who has indicated his/her understanding and acceptance.     Plan Discussed with: CRNA and Surgeon  Anesthesia Plan Comments:         Anesthesia Quick Evaluation

## 2011-01-13 NOTE — Interval H&P Note (Signed)
History and Physical Interval Note:  01/13/2011 7:56 AM  Eric Munoz  has presented today for surgery, with the diagnosis of bilateral hydronephrosis  The various methods of treatment have been discussed with the patient and family. After consideration of risks, benefits and other options for treatment, the patient has consented to  Procedure(s): CYSTOSCOPY WITH RETROGRADE PYELOGRAM/URETERAL STENT PLACEMENT as a surgical intervention .  The patients' history has been reviewed, patient examined, no change in status, stable for surgery.  I have reviewed the patients' chart and labs.  Questions were answered to the patient's satisfaction.     Chelsea Aus

## 2011-01-13 NOTE — Op Note (Signed)
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 with LMA  Drains: 26 x 7 contour stent, 18 French coud-tip catheter  Complications: None  Brief history: Eric Munoz is a 70 year old male who I've seen for some time. He has a history of recurrent adenocarcinoma of the colon, and underwent repeat colectomy and retroperitoneal mass excision in July of 2011. He had extensive involvement of his left ureter, and since that time has had an indwelling left ureteral stent. He underwent colostomy takedown with a diverting ileostomy and colocolostomy (very low anastomosis and (on 12/20/2010. The patient did have a bladder laceration, as he had significant adhesions in his deep pelvis, as well as difficult pelvic access. He did have a Foley catheter left in, and recently had a normal cystogram. The patient called recently with increased fluid output from his rectum. We saw him in the office with a CT scan approximately 5 days ago. This revealed right hydronephrosis and distal migration of the left double J stent. It was recommended that he undergo replacement of his left double-J stent, as well as attempted right ureteral stent placement here he he presents at that time for this procedure.  Description of procedure:  The patient was identified in the holding area and received preoperative IV antibiotics previous taken the operating room where general anesthetic was administered. He is placed in the dorsolithotomy position. Genitalia and perineum were prepped and draped. Time out was performed. After induction of anesthesia, significant amount of runny fecal material came  out through his rectum. Rigid cystoscopy was performed. This revealed a false passage posteriorly within the proximal urethra. This was easily traversed, and access was gained into the bladder. There was an obvious bladder abnormality in the low trigonal area. Just to the left of this was the left double-J stent. Using the 12 and 70 lenses, I was unable to find any right ureteral orifice. The bladder defect was approximately 8-10 mm in size. The bladder obviously drained through this, as the bladder filled. I placed a finger in the rectum, and could feel a staple line. I pressed gently up on this, the bladder was allowed to fill adequately. Despite at multiple attempts, I was unable to find the right ureteral orifice. There were 2-3 blue sutures in this area. The left double-J stent was grasped and gently removed through the urethral meatus. I then placed a guidewire through this, and up into the left renal pelvis. Over the guidewire I passed a 6 [place] open-ended catheter and a retrograde pyelogram was performed.  This revealed a normal pyelocalyceal system on the left with adequate positioning of the catheter. I then replaced the wire, removed the open-ended catheter, and using a cystoscope placed a 26 cm x 6 French contour stent without string. Good proximal and distal curls were seen using fluoroscopic and cystoscopic guidance.  At this point I removed the cystoscope, and placed an 110 French coud-tip catheter. The balloon was filled with 10 cc of water. I then placed Cystografin through this. This revealed obvious drainage into the rectum, but more superiorly there was either reflux into the seminal vesicles bilaterally or small extravesical cavities. I allowed the catheter to drain at this point it should be noted that after I placed my finger within the rectum,  the glove was changed immediately, and did not effect other portions of the procedure.  The patient tolerated procedure well. He was awakened  and taken to PACU in stable condition.  I will consult interventional radiology for nephrostomy tube placement and eventual attempted right double-J stent internalization.

## 2011-01-13 NOTE — Anesthesia Postprocedure Evaluation (Signed)
  Anesthesia Post-op Note  Patient: Eric Munoz  Procedure(s) Performed:  CYSTOSCOPY WITH RETROGRADE PYELOGRAM/URETERAL STENT PLACEMENT - intra-operative cystogram  Patient Location: PACU  Anesthesia Type: General  Level of Consciousness: oriented and sedated  Airway and Oxygen Therapy: Patient Spontanous Breathing and Patient connected to nasal cannula oxygen  Post-op Pain: none  Post-op Assessment: Post-op Vital signs reviewed, Patient's Cardiovascular Status Stable, Respiratory Function Stable and Patent Airway  Post-op Vital Signs: stable  Complications: No apparent anesthesia complications

## 2011-01-13 NOTE — ED Notes (Signed)
[facility]

## 2011-01-13 NOTE — Transfer of Care (Signed)
Immediate Anesthesia Transfer of Care Note  Patient: Eric Munoz  Procedure(s) Performed:  CYSTOSCOPY WITH RETROGRADE PYELOGRAM/URETERAL STENT PLACEMENT - intra-operative cystogram  Patient Location: PACU  Anesthesia Type: General  Level of Consciousness: awake and oriented  Airway & Oxygen Therapy: Patient Spontanous Breathing and Patient connected to face mask oxygen  Post-op Assessment: Report given to PACU RN  Post vital signs: Reviewed and stable  Complications: No apparent anesthesia complications

## 2011-01-13 NOTE — Procedures (Signed)
Successful [place] and fluoro guided rt pcn placement No comp Stable

## 2011-01-13 NOTE — Progress Notes (Signed)
P [facility]  [facility]  MEDICATED PT WITH  4mg  zofran upon arrival to  PACU.

## 2011-01-13 NOTE — Interval H&P Note (Cosign Needed)
History and Physical Interval Note: Patient and spouse present for today's procedure as patient sedated earlier today for cysto/stent procedure.  No change in physical exam status post OR today.  Urology was unable to place right ureteral stent.  Patient presents to [place] at the request of Dr. [name] for percutaneous nephrostomy placement on the right.  01/13/2011 3:28 PM  [name]  has presented today for surgery, with the diagnosis of right hydronephrosis and ureteral stricture.  The various methods of treatment have been discussed with the patient and family. After consideration of risks, benefits and other options for treatment, the patient has consented to  Procedure(s): right percutaneous nephrostomy drain placement under moderate sedation as a surgical intervention .  The patients' history has been reviewed, patient examined, no change in status, stable for surgery.  I have reviewed the patients' chart and labs.  Questions were answered to the patient's satisfaction.  Written consent was obtained from the patient's spouse as he was sedated earlier today.    CAMPBELL,PAMELA D, PA_c

## 2011-01-13 NOTE — ED Notes (Signed)
Incision made

## 2011-01-13 NOTE — H&P (Signed)
Urology Admission H&P  Chief Complaint: Right kidney blocked  History of Present Illness:   Eric Munoz is here for left stent exchange and attempthed right stent placement. He is s/p recent colostomy takedown and repair of bladder laceration during extensive adhesiololysis. He was found recently to have an obstructed right kidney.    Past Medical History  Diagnosis Date  . Easy bruising   . 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 Difficult intubation   . 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 S/P chemotherapy, time since greater than 12 weeks 03-2010 last chemo  . S/P radiation > 12 weeks 02-2010    last radiation   Past Surgical History  Procedure Date  . Portacath placement 12-11-10    09-17-09 right chest/ states will be removed 12-20-10  . Colostomy takedown 12/20/2010    Procedure: LAPAROSCOPIC COLOSTOMY TAKEDOWN;  Surgeon: [name], MD;  Location: [facility];  Service: General;  Laterality: N/A;  Laparoscopy Colostomy Takedownn With Ileostomy  . Port-a-cath removal 12/20/2010    Procedure: REMOVAL PORT-A-CATH;  Surgeon: [name], MD;  Location: [facility];  Service: General;  Laterality: Right;  Right  venous catheter no longer needed.  . Cystoscopy w/ ureteral stent placement 12/20/2010    Procedure: CYSTOSCOPY WITH STENT REPLACEMENT;  Surgeon: [name];  Location: [facility];  Service: Urology;  Laterality: Left;  Cystoscopy/Left Double J Stent Exchange  . Fracture surgery   . Tonsillectomy yrs ago  . Colon surgery 08/07/09    Low Anterior Rescection, resection of L ext iliac artery,  ureterolysis    Home Medications:  Prescriptions prior to admission  Medication Sig Dispense Refill  . Multiple Vitamin (MULTIVITAMIN PO) Take by mouth daily.        Marland Kitchen acetaminophen (TYLENOL) 325 MG tablet Take 650 mg by  mouth every 6 (six) hours as needed. Pain        . loperamide (IMODIUM A-D) 2 MG tablet Take 2 mg by mouth 2 (two) times daily.         Allergies: No Known Allergies  Family History  Problem Relation Age of Onset  . Stroke Father 69   Social History:  reports that he quit smoking about 36 years ago. He has never used smokeless tobacco. He reports that he does not drink alcohol or use illicit drugs.  Review of Systems  All other systems reviewed and are negative.    Physical Exam:  Vital signs in last 24 hours: Temp:  [97.5 F (36.4 C)] 97.5 F (36.4 C) (12/10 0552) Pulse Rate:  [114] 114  (12/10 0552) Resp:  [20] 20  (12/10 0552) BP: (133)/(78) 133/78 mmHg (12/10 0552) SpO2:  [100 %] 100 % (12/10 0552) Weight:  [64.978 kg (143 lb 4 oz)] 143 lb 4 oz (64.978 kg) (12/10 4098) Physical Exam  Constitutional: He appears well-developed and well-nourished.  HENT:  Head: Normocephalic and atraumatic.  Eyes: Conjunctivae and EOM are normal. Pupils are equal, round, and reactive to light.  Neck: Normal range of motion. Neck supple.  Cardiovascular: Normal rate and regular rhythm.   Respiratory: Effort normal and breath sounds normal.  GI: Soft. Bowel sounds are normal.       Wounds healing well  Skin: Skin is warm and dry.    Laboratory Data:  Results for orders placed during the  hospital encounter of 01/13/11 (from the past 24 hour(s))  CBC     Status: Abnormal   Collection Time   01/13/11  6:35 AM      Component Value Range   WBC 12.7 (*) 4.0 - 10.5 (K/uL)   RBC 3.01 (*) 4.22 - 5.81 (MIL/uL)   Hemoglobin 8.5 (*) 13.0 - 17.0 (g/dL)   HCT 84.1 (*) 32.4 - 52.0 (%)   MCV 85.0  78.0 - 100.0 (fL)   MCH 28.2  26.0 - 34.0 (pg)   MCHC 33.2  30.0 - 36.0 (g/dL)   RDW 40.1  02.7 - 25.3 (%)   Platelets 574 (*) 150 - 400 (K/uL)   No results found for this or any previous visit (from the past 240 hour(s)). Creatinine:  Basename 01/08/11 1540  CREATININE 2.04*   Baseline  Creatinine:   Impression/Assessment:  Urinary fistula, right hydronephrosis  Plan:  Anesthetic cystoscopy, left stent replacement, attempted right ureteral stent placement  Tiara Bartoli M 01/13/2011, 7:12 AM

## 2011-01-14 LAB — BASIC METABOLIC PANEL
BUN: 31 mg/dL — ABNORMAL HIGH (ref 6–23)
Creatinine, Ser: 1.35 mg/dL (ref 0.50–1.35)
GFR calc Af Amer: 60 mL/min — ABNORMAL LOW (ref >90)
GFR calc non Af Amer: 52 mL/min — ABNORMAL LOW (ref >90)

## 2011-01-14 LAB — CBC
MCH: 28.2 pg (ref 26.0–34.0)
MCHC: 32.5 g/dL (ref 30.0–36.0)
RDW: 15 % (ref 11.5–15.5)

## 2011-01-14 MED ORDER — THERA M PLUS PO TABS
1.0000 | ORAL_TABLET | Freq: Every day | ORAL | Status: DC
  Filled 2011-01-14: qty 1

## 2011-01-14 MED ORDER — CIPROFLOXACIN HCL 250 MG PO TABS: 250.0000 mg | ORAL_TABLET | Freq: Two times a day (BID) | ORAL | Status: DC

## 2011-01-14 MED ORDER — MULTIVITAMIN PO LIQD: 1.0000 | ORAL | Status: DC

## 2011-01-14 MED ORDER — ACETAMINOPHEN 325 MG PO TABS: 650.0000 mg | ORAL_TABLET | Freq: Four times a day (QID) | ORAL | Status: DC | PRN

## 2011-01-14 NOTE — Interval H&P Note (Signed)
History and Physical Interval Note: Heart and lung examination same as from yesterday.  No new changes in ROS since PCN placement.  01/14/2011 10:36 AM  [name]  has presented today for surgery, with the diagnosis of bilateral hydronephrosis  The various methods of treatment have been discussed with the patient and family. After consideration of risks, benefits and other options for treatment, the patient has consented to  Procedure(s):nephrostogram with placement of right ureteral stent under moderate sedation as a surgical intervention .  The patients' history has been reviewed, patient examined, no change in status, stable for surgery.  I have reviewed the patients' chart and labs.  Questions were answered to the patient's satisfaction.     CAMPBELL,PAMELA D

## 2011-01-14 NOTE — H&P (View-Only) (Signed)
1 Day Post-Op  Subjective: Doing well,. Ambulating in hallway. Tolerated breakfast without N/V.  Objective: Vital signs in last 24 hours: Temp:  [98 F (36.7 C)-98.9 F (37.2 C)] 98 F (36.7 C) (12/11 0600) Pulse Rate:  [67-104] 84  (12/11 0600) Resp:  [14-20] 18  (12/11 0600) BP: (101-138)/(58-77) 112/66 mmHg (12/11 0600) SpO2:  [98 %-100 %] 99 % (12/11 0600) Weight:  [145 lb 15.1 oz (66.2 kg)] 145 lb 15.1 oz (66.2 kg) (12/10 1348) Last BM Date: 01/14/11  Intake/Output from previous day: 12/10 0701 - 12/11 0700 In: 1100 [P.O.:240; I.V.:755; IV Piggyback:100] Out: 1955 [Urine:1655; Stool:300] Intake/Output this shift: Total I/O In: 10 [Other:10] Out: 175 [Urine:175]  Physical exam : PCN intact with clear urine.  Tender at insertion site. Foley still in place.   Lab Results:   Basename 01/14/11 0443 01/13/11 0635  WBC 7.9 12.7*  HGB 8.0* 8.5*  HCT 24.6* 25.6*  PLT 466* 574*   BMET  Basename 01/14/11 0443  NA 135  K 4.0  CL 105  CO2 22  GLUCOSE 91  BUN 31*  CREATININE 1.35  CALCIUM 9.1    Studies/Results: Ir Perc Nephrostomy Right  01/13/2011  *RADIOLOGY REPORT*  Clinical Data: Obstructive right hydronephrosis, unable to place urologic ureteral stent.  ULTRASOUND GUIDANCE FOR NEPHROSTOMY ACCESS 10-FRENCH RIGHT NEPHROSTOMY CATHETER PLACEMENT  Date:  01/13/2011 15:13:00  Radiologist:  M. Trevor Shick, M.D.  Medications:  1 mg Versed, 50 mcg Fentanyl  Guidance:  Ultrasound and fluoroscopic  Fluoroscopy time:  1.3 minutes  Sedation time:  15 minutes  Contrast volume:  15 ml Omnipaque-300  Complications:  No immediate  PROCEDURE/FINDINGS:  Informed consent was obtained from the patient following explanation of the procedure, risks, benefits and alternatives. The patient understands, agrees and consents for the procedure. All questions were addressed.  A time out was performed.  Maximal barrier sterile technique utilized including caps, mask, sterile gowns, sterile gloves,  large sterile drape, hand hygiene, and betadine  Previous imaging reviewed.  The patient was positioned prone. Hydronephrotic right kidney localized with ultrasound.  Under sterile conditions and local anesthesia, a 21 gauge 15 cm access needle was advanced in to the lower pole dilated calix.  Needle position confirmed with ultrasound.  There was return of clear urine.  Guide wire advanced centrally followed by the Accustick dilator set.  This allowed insertion of an Amplatz guide wire. Tract dilatation performed to advance a 10-French nephrostomy catheter, retention loop formed in the renal pelvis.  Contrast injection confirms position.  Collecting system decompressed by syringe aspiration.  Catheter secured with a Prolene suture externally.  Sterile dressing applied.  Catheter connected to gravity drainage.  No immediate complication.  The patient tolerated the procedure well.  IMPRESSION: Successful ultrasound and fluoroscopic 10-French right nephrostomy insertion.  Original Report Authenticated By: M. TREVOR SHICK, M.D.   Ir Us Guide Bx Asp/drain  01/13/2011  *RADIOLOGY REPORT*  Clinical Data: Obstructive right hydronephrosis, unable to place urologic ureteral stent.  ULTRASOUND GUIDANCE FOR NEPHROSTOMY ACCESS 10-FRENCH RIGHT NEPHROSTOMY CATHETER PLACEMENT  Date:  01/13/2011 15:13:00  Radiologist:  M. Trevor Shick, M.D.  Medications:  1 mg Versed, 50 mcg Fentanyl  Guidance:  Ultrasound and fluoroscopic  Fluoroscopy time:  1.3 minutes  Sedation time:  15 minutes  Contrast volume:  15 ml Omnipaque-300  Complications:  No immediate  PROCEDURE/FINDINGS:  Informed consent was obtained from the patient following explanation of the procedure, risks, benefits and alternatives. The patient understands, agrees and consents for   the procedure. All questions were addressed.  A time out was performed.  Maximal barrier sterile technique utilized including caps, mask, sterile gowns, sterile gloves, large sterile drape, hand  hygiene, and betadine  Previous imaging reviewed.  The patient was positioned prone. Hydronephrotic right kidney localized with ultrasound.  Under sterile conditions and local anesthesia, a 21 gauge 15 cm access needle was advanced in to the lower pole dilated calix.  Needle position confirmed with ultrasound.  There was return of clear urine.  Guide wire advanced centrally followed by the Accustick dilator set.  This allowed insertion of an Amplatz guide wire. Tract dilatation performed to advance a 10-French nephrostomy catheter, retention loop formed in the renal pelvis.  Contrast injection confirms position.  Collecting system decompressed by syringe aspiration.  Catheter secured with a Prolene suture externally.  Sterile dressing applied.  Catheter connected to gravity drainage.  No immediate complication.  The patient tolerated the procedure well.  IMPRESSION: Successful ultrasound and fluoroscopic 10-French right nephrostomy insertion.  Original Report Authenticated By: M. TREVOR SHICK, M.D.    Anti-infectives: Anti-infectives     Start     Dose/Rate Route Frequency Ordered Stop   01/13/11 1100   ciprofloxacin (CIPRO) tablet 250 mg        250 mg Oral Every 12 hours 01/13/11 1028     01/13/11 0615   ceFAZolin (ANCEF) IVPB 1 g/50 mL premix        1 g 100 mL/hr over 30 Minutes Intravenous 30 min pre-op 01/13/11 0609 01/13/11 0800          Assessment/Plan:   Post right PCN placement 12/10 secondary to failed ureteral stent placement in OR.  Patient for internalization under fluoro guidance with moderate sedation for later today.  Patient aware of procedure details and is in agreement to proceed.  CAMPBELL,PAMELA D 01/14/2011  

## 2011-01-14 NOTE — Progress Notes (Signed)
1 Day Post-Op  Subjective: Doing well,. Ambulating in hallway. Tolerated breakfast without N/V.  Objective: Vital signs in last 24 hours: Temp:  [98 F (36.7 C)-98.9 F (37.2 C)] 98 F (36.7 C) (12/11 0600) Pulse Rate:  [67-104] 84  (12/11 0600) Resp:  [14-20] 18  (12/11 0600) BP: (101-138)/(58-77) 112/66 mmHg (12/11 0600) SpO2:  [98 %-100 %] 99 % (12/11 0600) Weight:  [145 lb 15.1 oz (66.2 kg)] 145 lb 15.1 oz (66.2 kg) (12/10 1348) Last BM Date: 01/14/11  Intake/Output from previous day: 12/10 0701 - 12/11 0700 In: 1100 [P.O.:240; I.V.:755; IV Piggyback:100] Out: 7846 [NGEXB:2841; Stool:300] Intake/Output this shift: Total I/O In: 10 [Other:10] Out: 175 [Urine:175]  Physical exam : PCN intact with clear urine.  Tender at insertion site. Foley still in place.   Lab Results:   [facility] 01/14/11 0443 01/13/11 0635  WBC 7.9 12.7*  HGB 8.0* 8.5*  HCT 24.6* 25.6*  PLT 466* 574*   BMET  Basename 01/14/11 0443  NA 135  K 4.0  CL 105  CO2 22  GLUCOSE 91  BUN 31*  CREATININE 1.35  CALCIUM 9.1    Studies/Results: Ir Perc Nephrostomy Right  01/13/2011  *RADIOLOGY REPORT*  Clinical Data: Obstructive right hydronephrosis, unable to place urologic ureteral stent.  ULTRASOUND GUIDANCE FOR NEPHROSTOMY ACCESS 10-FRENCH RIGHT NEPHROSTOMY CATHETER PLACEMENT  Date:  01/13/2011 15:13:00  Radiologist:  M. Ruel Favors, M.D.  Medications:  1 mg Versed, 50 mcg Fentanyl  Guidance:  Ultrasound and fluoroscopic  Fluoroscopy time:  1.3 minutes  Sedation time:  15 minutes  Contrast volume:  15 ml Omnipaque-300  Complications:  No immediate  PROCEDURE/FINDINGS:  Informed consent was obtained from the patient following explanation of the procedure, risks, benefits and alternatives. The patient understands, agrees and consents for the procedure. All questions were addressed.  A time out was performed.  Maximal barrier sterile technique utilized including caps, mask, sterile gowns, sterile gloves,  large sterile drape, hand hygiene, and betadine  Previous imaging reviewed.  The patient was positioned prone. Hydronephrotic right kidney localized with ultrasound.  Under sterile conditions and local anesthesia, a 21 gauge 15 cm access needle was advanced in to the lower pole dilated calix.  Needle position confirmed with ultrasound.  There was return of clear urine.  Guide wire advanced centrally followed by the Accustick dilator set.  This allowed insertion of an Amplatz guide wire. Tract dilatation performed to advance a 10-French nephrostomy catheter, retention loop formed in the renal pelvis.  Contrast injection confirms position.  Collecting system decompressed by syringe aspiration.  Catheter secured with a Prolene suture externally.  Sterile dressing applied.  Catheter connected to gravity drainage.  No immediate complication.  The patient tolerated the procedure well.  IMPRESSION: Successful ultrasound and fluoroscopic 10-French right nephrostomy insertion.  Original Report Authenticated By: Judie Petit. Ruel Favors, M.D.   Ir US Guide Bx Asp/drain  01/13/2011  *RADIOLOGY REPORT*  Clinical Data: Obstructive right hydronephrosis, unable to place urologic ureteral stent.  ULTRASOUND GUIDANCE FOR NEPHROSTOMY ACCESS 10-FRENCH RIGHT NEPHROSTOMY CATHETER PLACEMENT  Date:  01/13/2011 15:13:00  Radiologist:  M. Ruel Favors, M.D.  Medications:  1 mg Versed, 50 mcg Fentanyl  Guidance:  Ultrasound and fluoroscopic  Fluoroscopy time:  1.3 minutes  Sedation time:  15 minutes  Contrast volume:  15 ml Omnipaque-300  Complications:  No immediate  PROCEDURE/FINDINGS:  Informed consent was obtained from the patient following explanation of the procedure, risks, benefits and alternatives. The patient understands, agrees and consents for  the procedure. All questions were addressed.  A time out was performed.  Maximal barrier sterile technique utilized including caps, mask, sterile gowns, sterile gloves, large sterile drape, hand  hygiene, and betadine  Previous imaging reviewed.  The patient was positioned prone. Hydronephrotic right kidney localized with ultrasound.  Under sterile conditions and local anesthesia, a 21 gauge 15 cm access needle was advanced in to the lower pole dilated calix.  Needle position confirmed with ultrasound.  There was return of clear urine.  Guide wire advanced centrally followed by the Accustick dilator set.  This allowed insertion of an Amplatz guide wire. Tract dilatation performed to advance a 10-French nephrostomy catheter, retention loop formed in the renal pelvis.  Contrast injection confirms position.  Collecting system decompressed by syringe aspiration.  Catheter secured with a Prolene suture externally.  Sterile dressing applied.  Catheter connected to gravity drainage.  No immediate complication.  The patient tolerated the procedure well.  IMPRESSION: Successful ultrasound and fluoroscopic 10-French right nephrostomy insertion.  Original Report Authenticated By: Judie Petit. Ruel Favors, M.D.    Anti-infectives: Anti-infectives     Start     Dose/Rate Route Frequency Ordered Stop   01/13/11 1100   ciprofloxacin (CIPRO) tablet 250 mg        250 mg Oral Every 12 hours 01/13/11 1028     01/13/11 0615   ceFAZolin (ANCEF) IVPB 1 g/50 mL premix        1 g 100 mL/hr over 30 Minutes Intravenous 30 min pre-op 01/13/11 1478 01/13/11 0800          Assessment/Plan:   Post right PCN placement 12/10 secondary to failed ureteral stent placement in OR.  Patient for internalization under fluoro guidance with moderate sedation for later today.  Patient aware of procedure details and is in agreement to proceed.  CAMPBELL,PAMELA D 01/14/2011

## 2011-01-14 NOTE — Plan of Care (Signed)
Problem: Phase I Progression Outcomes Goal: Voiding-avoid urinary catheter unless indicated Outcome: Adequate for Discharge Discharged with Foley  Problem: Phase II Progression Outcomes Goal: Foley discontinued Outcome: Adequate for Discharge Discharged with Foley  Problem: Phase III Progression Outcomes Goal: Voiding independently Outcome: Adequate for Discharge Discharged with Foley

## 2011-01-14 NOTE — Progress Notes (Signed)
1 Day Post-Op Subjective: The patient is doing well.  No nausea or vomiting. Pain is adequately controlled. The only discomfort he has is in his right flank from the nephrostomy tube.  Objective: Vital signs in last 24 hours: Temp:  [98 F (36.7 C)-98.9 F (37.2 C)] 98 F (36.7 C) (12/11 0600) Pulse Rate:  [67-104] 84  (12/11 0600) Resp:  [14-20] 18  (12/11 0600) BP: (101-138)/(57-77) 112/66 mmHg (12/11 0600) SpO2:  [98 %-100 %] 99 % (12/11 0600) Weight:  [66.2 kg (145 lb 15.1 oz)] 145 lb 15.1 oz (66.2 kg) (12/10 1348)  Intake/Output from previous day: 12/10 0701 - 12/11 0700 In: 1100 [P.O.:240; I.V.:755; IV Piggyback:100] Out: 1610 [RUEAV:4098; Stool:300] Intake/Output this shift: Total I/O In: 660 [I.V.:555; Other:5; IV Piggyback:100] Out: 1375 [Urine:1075; Stool:300] Nephrostomy tube drainage is clear. 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 Status post cystoscopy, attempted right ureteral catheterization, cystogram, left double J stent exchange. He is doing well. Right percutaneous nephrostomy tube was placed yesterday.  Plan is to internalize the right double-J stent either today or tomorrow, whenever interventional radiology can schedule this. If it is done earlier in the day today, the patient will be able to go home later.   Bertram Millard. Grisell Bissette, MD  01/14/2011, 6:35 AM

## 2011-01-14 NOTE — Progress Notes (Signed)
Pt discharged with RUQ ileostomy, Foley catheter and R nephrostomy tube. Leg bag and extra drainage bag as well as gauze and tape for dressing sent with pt.

## 2011-01-15 ENCOUNTER — Other Ambulatory Visit: Payer: Self-pay | Admitting: Urology

## 2011-01-15 ENCOUNTER — Other Ambulatory Visit: Payer: Self-pay | Admitting: Radiology

## 2011-01-15 ENCOUNTER — Encounter (HOSPITAL_COMMUNITY): Payer: Self-pay | Admitting: Urology

## 2011-01-15 DIAGNOSIS — N1330 Unspecified hydronephrosis: Secondary | ICD-10-CM

## 2011-01-16 ENCOUNTER — Ambulatory Visit (HOSPITAL_COMMUNITY): Payer: BC Managed Care – PPO

## 2011-01-16 ENCOUNTER — Ambulatory Visit (HOSPITAL_COMMUNITY)
Admission: RE | Admit: 2011-01-16 | Discharge: 2011-01-16 | Disposition: A | Discharge status: Home / Self Care | Payer: BC Managed Care – PPO | Source: Ambulatory Visit | Attending: Urology | Admitting: Urology

## 2011-01-16 ENCOUNTER — Other Ambulatory Visit (HOSPITAL_COMMUNITY): Payer: Self-pay | Admitting: Diagnostic Radiology

## 2011-01-16 ENCOUNTER — Telehealth (INDEPENDENT_AMBULATORY_CARE_PROVIDER_SITE_OTHER): Payer: Self-pay

## 2011-01-16 ENCOUNTER — Other Ambulatory Visit: Payer: Self-pay | Admitting: Urology

## 2011-01-16 ENCOUNTER — Ambulatory Visit (HOSPITAL_COMMUNITY)
Admission: RE | Admit: 2011-01-16 | Discharge: 2011-01-16 | Disposition: A | Discharge status: Home / Self Care | Payer: BC Managed Care – PPO | Source: Ambulatory Visit | Attending: Diagnostic Radiology | Admitting: Diagnostic Radiology

## 2011-01-16 DIAGNOSIS — N1330 Unspecified hydronephrosis: Secondary | ICD-10-CM | POA: Insufficient documentation

## 2011-01-16 DIAGNOSIS — Z436 Encounter for attention to other artificial openings of urinary tract: Secondary | ICD-10-CM | POA: Insufficient documentation

## 2011-01-16 DIAGNOSIS — D649 Anemia, unspecified: Secondary | ICD-10-CM | POA: Insufficient documentation

## 2011-01-16 DIAGNOSIS — Z85038 Personal history of other malignant neoplasm of large intestine: Secondary | ICD-10-CM | POA: Insufficient documentation

## 2011-01-16 DIAGNOSIS — C20 Malignant neoplasm of rectum: Secondary | ICD-10-CM

## 2011-01-16 DIAGNOSIS — N151 Renal and perinephric abscess: Secondary | ICD-10-CM | POA: Insufficient documentation

## 2011-01-16 DIAGNOSIS — N135 Crossing vessel and stricture of ureter without hydronephrosis: Secondary | ICD-10-CM | POA: Insufficient documentation

## 2011-01-16 MED ORDER — METRONIDAZOLE 500 MG PO TABS: 500.0000 mg | ORAL_TABLET | Freq: Three times a day (TID) | ORAL | Status: AC

## 2011-01-16 MED ORDER — FENTANYL CITRATE 0.05 MG/ML IJ SOLN
INTRAMUSCULAR | Status: AC | PRN
  Administered 2011-01-16 (×2): 50 ug via INTRAVENOUS
  Administered 2011-01-16: 100 ug via INTRAVENOUS

## 2011-01-16 MED ORDER — FENTANYL CITRATE 0.05 MG/ML IJ SOLN
INTRAMUSCULAR | Status: AC | PRN
  Administered 2011-01-16: 100 ug via INTRAVENOUS

## 2011-01-16 MED ORDER — MIDAZOLAM HCL 5 MG/5ML IJ SOLN
INTRAMUSCULAR | Status: AC | PRN
  Administered 2011-01-16: 2 mg via INTRAVENOUS

## 2011-01-16 MED ORDER — SODIUM CHLORIDE 0.9 % IV SOLN
INTRAVENOUS | Status: DC
  Administered 2011-01-16: 09:00:00 via INTRAVENOUS

## 2011-01-16 MED ORDER — CIPROFLOXACIN HCL 500 MG PO TABS: 500.0000 mg | ORAL_TABLET | Freq: Two times a day (BID) | ORAL | Status: AC

## 2011-01-16 MED ORDER — MIDAZOLAM HCL 5 MG/5ML IJ SOLN
INTRAMUSCULAR | Status: AC | PRN
  Administered 2011-01-16: 2 mg via INTRAVENOUS
  Administered 2011-01-16: 1 mg via INTRAVENOUS

## 2011-01-16 MED ORDER — IOHEXOL 300 MG/ML  SOLN
12.0000 mL | Freq: Once | INTRAMUSCULAR | Status: AC | PRN
  Administered 2011-01-16: 12 mL

## 2011-01-16 MED ORDER — CIPROFLOXACIN IN D5W 400 MG/200ML IV SOLN
400.0000 mg | Freq: Once | INTRAVENOUS | Status: AC
  Administered 2011-01-16: 400 mg via INTRAVENOUS
  Filled 2011-01-16: qty 200

## 2011-01-16 NOTE — Procedures (Signed)
Post-Procedure Note  Pre-operative Diagnosis: Right hydronephrosis and right urinoma       Post-operative Diagnosis: Pyonephrosis and enlarged perinephric fluid collections   Indications: Right hydronephrosis.  Needs ureteral stent  Procedure Details:   Placed right ureteral stent and replaced right nephrostomy tube.  Aspirated purulent fluid from new nephrostomy.  Discussed with Dr. [name] and obtained CT scan of abdomen.  CT demonstrated enlarged right perinephric fluid collections.  CT -guided drain placement in two of the collections.  The collections looked like urinomas rather than abscesses.  Findings: Concern for right pyonephrosis.  New perinephric drains look like urine.  Complications: none     Condition: Good  Plan: Discussed with Dr. [name] and Dr. Michaell Cowing.  Will observe patient and evaluate for discharge or overnight observation.  Follow up with Dr. Michaell Cowing next week.

## 2011-01-16 NOTE — ED Notes (Signed)
General Surgery bed requested as per Dr. Lowella Dandy.

## 2011-01-16 NOTE — Progress Notes (Signed)
Patient discharged via w/c to home with wife. Printed discharge instructions given.

## 2011-01-16 NOTE — ED Notes (Addendum)
Pt transported to CT for scan of abd/pelvis on stretcher with monitor and RN.

## 2011-01-16 NOTE — ED Notes (Signed)
Dr. Lowella Dandy at bedside to discuss procedure, results and plan of care with pt.

## 2011-01-16 NOTE — Progress Notes (Signed)
Report called to Darl Pikes, RN accepting nurse in Endo lab for post procedure monitoring pending d/c at 1530 if pt stable per Dr. Lowella Dandy.  Pt transported via stretcher with portable monitor and RN.

## 2011-01-16 NOTE — Telephone Encounter (Signed)
Called pt's wife to speak about Dr [name] calling Dr Michaell Cowing w/pt over at [facility] radiology having drains put in for abscess. The wife said the pt was feeling ok with no symptoms of the abscess. Per DR Michaell Cowing we will start pt on Cipro 500mg  BID and Flagyl 500mg  TID x10days w/1RF. The pt will see DR Gross on Monday 01-20-11 but if the pt has any changes what so ever to call our office right away or take pt to the ER per DR Gross. The wife understands is ok with the info.Hulda Humphrey

## 2011-01-16 NOTE — H&P (Signed)
Eric Munoz is an 70 y.o. male.   Chief Complaint: " I'm here for stent placement" HPI: Patient with history of colon cancer and bowel resection (colostomy/ileostomy) presents today for placement of right ureteral stent secondary to chronic right ureteral stricture (failed attempt in OR).Patient has existing right percutaneous nephrostomy in place.  Past Medical History  Diagnosis Date  . Easy bruising   . 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 Difficult intubation   . 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 S/P chemotherapy, time since greater than 12 weeks 03-2010 last chemo  . S/P radiation > 12 weeks 02-2010    last radiation    Past Surgical History  Procedure Date  . Portacath placement 12-11-10    09-17-09 right chest/ states will be removed 12-20-10  . Colostomy takedown 12/20/2010    Procedure: LAPAROSCOPIC COLOSTOMY TAKEDOWN;  Surgeon: [name], MD;  Location: [facility];  Service: General;  Laterality: N/A;  Laparoscopy Colostomy Takedownn With Ileostomy  . Port-a-cath removal 12/20/2010    Procedure: REMOVAL PORT-A-CATH;  Surgeon: [name], MD;  Location: [facility];  Service: General;  Laterality: Right;  Right  venous catheter no longer needed.  . Cystoscopy w/ ureteral stent placement 12/20/2010    Procedure: CYSTOSCOPY WITH STENT REPLACEMENT;  Surgeon: [name];  Location: [facility];  Service: Urology;  Laterality: Left;  Cystoscopy/Left Double J Stent Exchange  . Fracture surgery   . Tonsillectomy yrs ago  . Colon surgery 08/07/09    Low Anterior Rescection, resection of L ext iliac artery,  ureterolysis  . Cystoscopy w/ ureteral stent placement 01/13/2011    Procedure: CYSTOSCOPY WITH RETROGRADE PYELOGRAM/URETERAL STENT PLACEMENT;  Surgeon: [name];  Location: [facility];  Service: Urology;   Laterality: Left;  intra-operative cystogram    Family History  Problem Relation Age of Onset  . Stroke Father 31   Social History:  reports that he quit smoking about 36 years ago. He has never used smokeless tobacco. He reports that he does not drink alcohol or use illicit drugs.  Allergies: No Known Allergies  Medications Prior to Admission  Medication Sig Dispense Refill  . acetaminophen (TYLENOL) 325 MG tablet Take 650 mg by mouth every 6 (six) hours as needed. Pain        . ciprofloxacin (CIPRO) 250 MG tablet Take 1 tablet (250 mg total) by mouth every 12 (twelve) hours.  10 tablet  0  . loperamide (IMODIUM A-D) 2 MG tablet Take 2 mg by mouth 2 (two) times daily.        . Multiple Vitamin (MULTIVITAMIN PO) Take by mouth daily.         Medications Prior to Admission  Medication Dose Route Frequency Provider Last Rate Last Dose  . 0.9 %  sodium chloride infusion   Intravenous Continuous D Jeananne Rama, PA      . ciprofloxacin (CIPRO) IVPB 400 mg  400 mg Intravenous Once D Jeananne Rama, PA        Results for orders placed during the hospital encounter of 01/13/11  SURGICAL PCR SCREEN      Component Value Range   MRSA, PCR NEGATIVE  NEGATIVE    Staphylococcus aureus SAMPLE NOT SCREENED FOR [facility] (*) NEGATIVE   CBC      Component Value Range   WBC 12.7 (*)  4.0 - 10.5 (K/uL)   RBC 3.01 (*) 4.22 - 5.81 (MIL/uL)   Hemoglobin 8.5 (*) 13.0 - 17.0 (g/dL)   HCT 62.1 (*) 30.8 - 52.0 (%)   MCV 85.0  78.0 - 100.0 (fL)   MCH 28.2  26.0 - 34.0 (pg)   MCHC 33.2  30.0 - 36.0 (g/dL)   RDW 65.7  84.6 - 96.2 (%)   Platelets 574 (*) 150 - 400 (K/uL)  BASIC METABOLIC PANEL      Component Value Range   Sodium 135  135 - 145 (mEq/L)   Potassium 4.0  3.5 - 5.1 (mEq/L)   Chloride 105  96 - 112 (mEq/L)   CO2 22  19 - 32 (mEq/L)   Glucose, Bld 91  70 - 99 (mg/dL)   BUN 31 (*) 6 - 23 (mg/dL)   Creatinine, Ser 9.52  0.50 - 1.35 (mg/dL)   Calcium 9.1  8.4 - 84.1 (mg/dL)   GFR calc non  Af Amer 52 (*) >90 (mL/min)   GFR calc Af Amer 60 (*) >90 (mL/min)  CBC      Component Value Range   WBC 7.9  4.0 - 10.5 (K/uL)   RBC 2.84 (*) 4.22 - 5.81 (MIL/uL)   Hemoglobin 8.0 (*) 13.0 - 17.0 (g/dL)   HCT 32.4 (*) 40.1 - 52.0 (%)   MCV 86.6  78.0 - 100.0 (fL)   MCH 28.2  26.0 - 34.0 (pg)   MCHC 32.5  30.0 - 36.0 (g/dL)   RDW 02.7  25.3 - 66.4 (%)   Platelets 466 (*) 150 - 400 (K/uL)    Review of Systems  Constitutional: Negative for fever and chills.  Respiratory: Negative for cough and shortness of breath.   Cardiovascular: Negative for chest pain.  Gastrointestinal: Negative for nausea, vomiting and abdominal pain.  Genitourinary: Negative for hematuria and flank pain.  Musculoskeletal: Negative for back pain.  Neurological: Negative for headaches.  Endo/Heme/Allergies: Does not bruise/bleed easily.    Blood pressure 132/71, pulse 92, temperature 98.4 F (36.9 C), resp. rate 16, height 5\' 9"  (1.753 m), weight 145 lb (65.772 kg), SpO2 100.00%. Physical Exam  Constitutional: He is oriented to person, place, and time. He appears well-developed and well-nourished.  Cardiovascular: Normal rate and regular rhythm.   Respiratory: Effort normal and breath sounds normal.  GI: Soft. Bowel sounds are normal. There is no tenderness.       Ileostomy in place  Musculoskeletal: Normal range of motion. He exhibits no edema.  Neurological: He is alert and oriented to person, place, and time.     Assessment/Plan Patient with history of colon cancer and chronic ureteral strictures, s/p right percutaneous nephrostomy for hydronephrosis; plan is for right ureteral stent placement.  [name],D KEVIN 01/16/2011, 9:13 AM

## 2011-01-16 NOTE — ED Notes (Signed)
Will transport pt on stretcher with monitor and RN to prep room 7 for recovery and to await bed assignment.

## 2011-01-16 NOTE — Telephone Encounter (Signed)
LMOM for pt to call me to see how he is feeling per DR Gross./ [facility]

## 2011-01-17 NOTE — H&P (Signed)
Agree with PA note. 

## 2011-01-19 LAB — CULTURE, ROUTINE-ABSCESS

## 2011-01-20 ENCOUNTER — Encounter (INDEPENDENT_AMBULATORY_CARE_PROVIDER_SITE_OTHER): Payer: Self-pay | Admitting: Surgery

## 2011-01-20 ENCOUNTER — Ambulatory Visit (INDEPENDENT_AMBULATORY_CARE_PROVIDER_SITE_OTHER): Payer: BC Managed Care – PPO | Admitting: Surgery

## 2011-01-20 VITALS — BP 138/64 | HR 80 | Temp 97.6°F | Resp 16 | Ht 69.0 in | Wt 141.4 lb

## 2011-01-20 DIAGNOSIS — Z85038 Personal history of other malignant neoplasm of large intestine: Secondary | ICD-10-CM

## 2011-01-20 DIAGNOSIS — Z932 Ileostomy status: Secondary | ICD-10-CM

## 2011-01-20 DIAGNOSIS — IMO0002 Reserved for concepts with insufficient information to code with codable children: Secondary | ICD-10-CM | POA: Insufficient documentation

## 2011-01-20 DIAGNOSIS — N135 Crossing vessel and stricture of ureter without hydronephrosis: Secondary | ICD-10-CM

## 2011-01-20 LAB — CULTURE, ROUTINE-ABSCESS
Culture: NO GROWTH
Gram Stain: NONE SEEN

## 2011-01-20 NOTE — Progress Notes (Signed)
Subjective:     Patient ID: Eric Munoz, male   DOB: 1940/10/09, 70 y.o.   MRN: [id number]  HPI   Eric Munoz  11-Jun-1940 [id number]  Patient Care Team: [name] as PCP - General (Family Medicine) [name] as Consulting Physician (Urology) [name], MD as Consulting Physician (Radiation Oncology) [name] as Consulting Physician (Gastroenterology) Samul Dada, MD as Consulting Physician (Hematology and Oncology)  This patient is a 70 y.o.male who presents today for surgical evaluation.   Diagnosis: Metastatic colon cancer status post resection and colostomy  Procedure: Colostomy takedown with protective loop ileostomy for coloanal anastomosis 12/20/2010.  The patient comes in today improving.  He has seen urology. Developed a delayed urine leak somewhere on his right genitourinary system. He had placement of a right external/internal percutaneous nephrostomy tube and stent. They found some fluid collections and aspirated them. All cultures are negative. He has a Foley catheter replaced for a presumed rectourinary fistula vs anastomotic leak. The patient noted he was getting clear liquid drainage out the anus.  He declined admission around the time of all these drains.  Since our last been done his wife notes that the patient's energy level is markedly improved. He denies any fevers or chills. He is taking oral Cipro.   They come in today with 3 drains on his right flank. The most posterior one is the highest volume. That seems to be the nephrostomy tube. The 2 anterior ones have not drained anything for several days. I went ahead and removed those after quadruple-checking orientation and radiology reports.  The patient is eating better. Emptying the ileostomy a few times a day. Walking more. Urine output seems to be increasing out the bladder Foley. Less from the side nephrostomy drain.  He still getting some rectal drainage. Maybe a shot glass a day  intermittent. He can hold it. No major leaking. No severe anal pain. No fevers chills nor sweats  Past Medical History  Diagnosis Date  . Easy bruising   . 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 Difficult intubation   . Colon cancer 2007, recurrent Jul2011    Recurrent, Stage 4, [id number] (Left colectomy/LAR , ChemoTx completed Feb2012/ XRT completed Jan2012  . S/P chemotherapy, time since greater than 12 weeks 03-2010 last chemo  . S/P radiation > 12 weeks 02-2010    last radiation    Past Surgical History  Procedure Date  . Portacath placement 12-11-10    09-17-09 right chest/ states will be removed 12-20-10  . Colostomy takedown 12/20/2010    Procedure: LAPAROSCOPIC COLOSTOMY TAKEDOWN;  Surgeon: [name], MD;  Location: [facility];  Service: General;  Laterality: N/A;  Laparoscopy Colostomy Takedownn With Ileostomy  . Port-a-cath removal 12/20/2010    Procedure: REMOVAL PORT-A-CATH;  Surgeon: [name], MD;  Location: [facility];  Service: General;  Laterality: Right;  Right  venous catheter no longer needed.  . Cystoscopy w/ ureteral stent placement 12/20/2010    Procedure: CYSTOSCOPY WITH STENT REPLACEMENT;  Surgeon: [name];  Location: [facility];  Service: Urology;  Laterality: Left;  Cystoscopy/Left Double J Stent Exchange  . Fracture surgery   . Tonsillectomy yrs ago  . Colon surgery 08/07/09    Low Anterior Rescection, resection of L ext iliac artery,  ureterolysis  . Cystoscopy w/ ureteral stent placement 01/13/2011    Procedure: CYSTOSCOPY WITH RETROGRADE PYELOGRAM/URETERAL STENT PLACEMENT;  Surgeon: [name];  Location: [facility];  Service: Urology;  Laterality: Left;  intra-operative cystogram    History   Social History  . Marital Status: Married    Spouse Name: N/A    Number of Children: N/A  . Years of Education: N/A   Occupational History  . Not on file.   Social History Main Topics  .  Smoking status: Former Smoker -- 1.0 packs/day for 10 years    Quit date: 02/03/1974  . Smokeless tobacco: Never Used  . Alcohol Use: No  . Drug Use: No  . Sexually Active: Yes   Other Topics Concern  . Not on file   Social History Narrative  . No narrative on file    Family History  Problem Relation Age of Onset  . Stroke Father 25  . Cancer Father     colon    Current outpatient prescriptions:acetaminophen (TYLENOL) 325 MG tablet, Take 650 mg by mouth every 6 (six) hours as needed. Pain  , Disp: , Rfl: ;  ciprofloxacin (CIPRO) 500 MG tablet, Take 1 tablet (500 mg total) by mouth 2 (two) times daily., Disp: 20 tablet, Rfl: 1;  loperamide (IMODIUM A-D) 2 MG tablet, Take 2 mg by mouth 2 (two) times daily.  , Disp: , Rfl: ;  Multiple Vitamin (MULTIVITAMIN PO), Take by mouth daily.  , Disp: , Rfl:  No current facility-administered medications for this visit. Facility-Administered Medications Ordered in Other Visits: DISCONTD: 0.9 %  sodium chloride infusion, , Intravenous, Continuous, D Jeananne Rama, PA, Last Rate: 20 mL/hr at 01/16/11 0830  No Known Allergies  BP 138/64  Pulse 80  Temp(Src) 97.6 F (36.4 C) (Temporal)  Resp 16  Ht 5\' 9"  (1.753 m)  Wt 141 lb 6.4 oz (64.139 kg)  BMI 20.88 kg/m2     Review of Systems  Constitutional: Negative for fever, chills and diaphoresis.  HENT: Negative for sore throat, trouble swallowing and neck pain.   Eyes: Negative for photophobia and visual disturbance.  Respiratory: Negative for choking and shortness of breath.   Cardiovascular: Negative for chest pain and palpitations.  Gastrointestinal: Negative for nausea, vomiting, abdominal distention, anal bleeding and rectal pain.  Genitourinary: Negative for dysuria, urgency, difficulty urinating and testicular pain.  Musculoskeletal: Negative for myalgias, arthralgias and gait problem.  Skin: Negative for color change and rash.  Neurological: Negative for dizziness, speech difficulty,  weakness and numbness.  Hematological: Negative for adenopathy.  Psychiatric/Behavioral: Negative for hallucinations, confusion and agitation.       Objective:   Physical Exam  Constitutional: He is oriented to person, place, and time. He appears well-developed and well-nourished. No distress.  HENT:  Head: Normocephalic.  Mouth/Throat: Oropharynx is clear and moist. No oropharyngeal exudate.  Eyes: Conjunctivae and EOM are normal. Pupils are equal, round, and reactive to light. No scleral icterus.  Neck: Normal range of motion. No tracheal deviation present.  Cardiovascular: Normal rate, normal heart sounds and intact distal pulses.   Pulmonary/Chest: Effort normal. No respiratory distress.  Abdominal: Soft. He exhibits no distension and no mass. There is no tenderness. There is no rebound and no guarding. Hernia confirmed negative in the right inguinal area and confirmed negative in the left inguinal area.       Incisions clean with normal healing ridges.  No hernias.  Loop ileostomy pink with thick brown succus.  RLQ drain serous - I removed  Genitourinary:       Foley in.  Clear urine.  Anterior R  flank drains (#2 & #3) minimal fluid ~71mL a day x 3 days - I removed.  R flank most-posterior #1 with 150 mL clear urine out c/w Int/ext nephrostomy drain.  Musculoskeletal: Normal range of motion. He exhibits no tenderness.  Neurological: He is alert and oriented to person, place, and time. No cranial nerve deficit. He exhibits normal muscle tone. Coordination normal.  Skin: Skin is warm and dry. No rash noted. He is not diaphoretic.  Psychiatric: He has a normal mood and affect. His behavior is normal.       Assessment:     One month s/p colostomy takedown w coloanal anastomosis & temporary protective loop ileostomy, recovering    Plan:     Despite his rough postop course with acute renal failure, delayed urine leak w int/ext drains & B stents, I think he is making good strides.  I'm hopeful he will continue to improve. I did educate he and his wife about if he has worsening pain, energy, fevers, nausea/vomiting; please call to make sure that he is not developing an abscess or a delayed leak.  I suspect he has a small anastomotic leak that is allowing the peritoneal/serous fluid drained out. With him being doubly debrided, it should close down. I will hold off on any examination for right now. If it persists, I may consider examination under anesthesia. Dr. Ralph Leyden stent with urology splinted and over again, I might add on review of that region just to check.  Probably around 4-6 months if he has no more problems & the urine leak is sealed, I would plan to do a loop ileostomy takedown. Perhaps do an enema preoperatively to make sure there no surprises or strictures. His pelvis was very tight at takedown.  Increase activity as tolerated.  Do not push through pain.  Advanced on diet as tolerated. Bowel regimen to avoid problems.  Return to clinic 4 weeks. The patient expressed understanding and appreciation

## 2011-01-22 ENCOUNTER — Encounter (INDEPENDENT_AMBULATORY_CARE_PROVIDER_SITE_OTHER): Payer: BC Managed Care – PPO | Admitting: Surgery

## 2011-01-31 ENCOUNTER — Other Ambulatory Visit: Payer: Self-pay | Admitting: Urology

## 2011-01-31 DIAGNOSIS — N1330 Unspecified hydronephrosis: Secondary | ICD-10-CM

## 2011-02-05 ENCOUNTER — Other Ambulatory Visit: Payer: Self-pay | Admitting: Urology

## 2011-02-05 ENCOUNTER — Ambulatory Visit (HOSPITAL_COMMUNITY)
Admission: RE | Admit: 2011-02-05 | Discharge: 2011-02-05 | Disposition: A | Discharge status: Home / Self Care | Payer: BC Managed Care – PPO | Source: Ambulatory Visit | Attending: Urology | Admitting: Urology

## 2011-02-05 DIAGNOSIS — N1330 Unspecified hydronephrosis: Secondary | ICD-10-CM

## 2011-02-05 DIAGNOSIS — Z436 Encounter for attention to other artificial openings of urinary tract: Secondary | ICD-10-CM | POA: Insufficient documentation

## 2011-02-05 MED ORDER — IOHEXOL 300 MG/ML  SOLN: 12.0000 mL | Freq: Once | INTRAMUSCULAR | Status: AC | PRN

## 2011-02-06 ENCOUNTER — Encounter (INDEPENDENT_AMBULATORY_CARE_PROVIDER_SITE_OTHER): Payer: Self-pay | Admitting: Surgery

## 2011-02-06 ENCOUNTER — Ambulatory Visit (INDEPENDENT_AMBULATORY_CARE_PROVIDER_SITE_OTHER): Payer: BC Managed Care – PPO | Admitting: Surgery

## 2011-02-06 VITALS — BP 142/88 | HR 96 | Temp 97.2°F | Resp 20 | Ht 69.0 in | Wt 144.8 lb

## 2011-02-06 DIAGNOSIS — Z86010 Personal history of colonic polyps: Secondary | ICD-10-CM

## 2011-02-06 DIAGNOSIS — IMO0002 Reserved for concepts with insufficient information to code with codable children: Secondary | ICD-10-CM

## 2011-02-06 DIAGNOSIS — Z932 Ileostomy status: Secondary | ICD-10-CM

## 2011-02-06 NOTE — Progress Notes (Signed)
Subjective:     Patient ID: Eric Munoz, male   DOB: 1940/12/03, 71 y.o.   MRN: [id number]  HPI   Eric Munoz  [birth year] [id number]  Patient Care Team: [name], MD as PCP - General (Family Medicine) [name], MD as Consulting Physician (Urology) [name], MD as Consulting Physician (Radiation Oncology) [name], MD as Consulting Physician (Gastroenterology) Samul Dada, MD as Consulting Physician (Hematology and Oncology)  This patient is a 71 y.o.male who presents today for surgical evaluation.   Diagnosis: Metastatic colon cancer status post resection and colostomy  Procedure: Colostomy takedown with protective loop ileostomy for coloanal anastomosis 12/20/2010.  The patient comes in today improving.  He has seen urology.  His right external/internal percutaneous nephrostomy tube and stent has changed to just an internal stent.  He has a Foley catheter still a presumed bladder leak. The clear liquid drainage out the anus is very scant now.  The patient's energy level is markedly improved. He denies any fevers or chills.   The patient is eating fine.   Increased ileostomy output, so upping his Imodium to 4pills/day. Walking OK. Urine output seems fine.  Past Medical History  Diagnosis Date  . Easy bruising   . 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 Difficult intubation   . Colon cancer 2007, recurrent Jul2011    Recurrent, Stage 4, [id number] (Left colectomy/LAR , ChemoTx completed Feb2012/ XRT completed Jan2012  . S/P chemotherapy, time since greater than 12 weeks 03-2010 last chemo  . S/P radiation > 12 weeks 02-2010    last radiation  . Hypertension     Past Surgical History  Procedure Date  . Portacath placement 12-11-10    09-17-09 right chest/ states will be removed 12-20-10  . Colostomy takedown 12/20/2010    Procedure: LAPAROSCOPIC COLOSTOMY TAKEDOWN;  Surgeon: [name], MD;  Location: [facility];  Service: General;  Laterality: N/A;  Laparoscopy Colostomy Takedownn With Ileostomy  . Port-a-cath removal 12/20/2010    Procedure: REMOVAL PORT-A-CATH;  Surgeon: [name], MD;  Location: [facility];  Service: General;  Laterality: Right;  Right  venous catheter no longer needed.  . Cystoscopy w/ ureteral stent placement 12/20/2010    Procedure: CYSTOSCOPY WITH STENT REPLACEMENT;  Surgeon: [name];  Location: [facility];  Service: Urology;  Laterality: Left;  Cystoscopy/Left Double J Stent Exchange  . Fracture surgery   . Tonsillectomy yrs ago  . Colon surgery 08/07/09    Low Anterior Rescection, resection of L ext iliac artery,  ureterolysis  . Cystoscopy w/ ureteral stent placement 01/13/2011    Procedure: CYSTOSCOPY WITH RETROGRADE PYELOGRAM/URETERAL STENT PLACEMENT;  Surgeon: [name];  Location: [facility];  Service: Urology;  Laterality: Left;  intra-operative cystogram    History   Social History  . Marital Status: Married    Spouse Name: N/A    Number of Children: N/A  . Years of Education: N/A   Occupational History  . Not on file.   Social History Main Topics  . Smoking status: Former Smoker -- 1.0 packs/day for 10 years    Quit date: 02/03/1974  . Smokeless tobacco: Never Used  . Alcohol Use: No  . Drug Use: No  . Sexually Active: Yes   Other Topics Concern  . Not on file   Social History Narrative  . No narrative on file    Family History  Problem  Relation Age of Onset  . Stroke Father 49  . Cancer Father     colon  . Lupus Daughter     Current outpatient prescriptions:acetaminophen (TYLENOL) 325 MG tablet, Take 650 mg by mouth every 6 (six) hours as needed. Pain  , Disp: , Rfl: ;  diphenoxylate-atropine (LOMOTIL) 2.5-0.025 MG per tablet, Take 1 tablet by mouth as needed.  , Disp: , Rfl: ;  lidocaine-prilocaine (EMLA) cream, Apply topically as needed.  , Disp: , Rfl: ;  loperamide (IMODIUM A-D) 2 MG tablet, Take 2  mg by mouth 2 (two) times daily.  , Disp: , Rfl:  Multiple Vitamin (MULTIVITAMIN PO), Take by mouth daily.  , Disp: , Rfl: ;  ondansetron (ZOFRAN) 8 MG tablet, Take 8 mg by mouth every 8 (eight) hours as needed.  , Disp: , Rfl: ;  prochlorperazine (COMPAZINE) 10 MG tablet, Take 10 mg by mouth every 6 (six) hours as needed.  , Disp: , Rfl: ;  psyllium (METAMUCIL) 58.6 % powder, Take 1 packet by mouth 2 (two) times daily.  , Disp: , Rfl:  No current facility-administered medications for this visit. Facility-Administered Medications Ordered in Other Visits: iohexol (OMNIPAQUE) 300 MG/ML solution 12 mL, 12 mL, Other, Once PRN, Medication Radiologist  No Known Allergies  BP 142/88  Pulse 96  Temp(Src) 97.2 F (36.2 C) (Temporal)  Resp 20  Ht 5\' 9"  (1.753 m)  Wt 144 lb 12.8 oz (65.681 kg)  BMI 21.38 kg/m2     Review of Systems  Constitutional: Negative for fever, chills and diaphoresis.  HENT: Negative for sore throat, trouble swallowing and neck pain.   Eyes: Negative for photophobia and visual disturbance.  Respiratory: Negative for choking and shortness of breath.   Cardiovascular: Negative for chest pain and palpitations.  Gastrointestinal: Negative for nausea, vomiting, abdominal distention, anal bleeding and rectal pain.  Genitourinary: Negative for dysuria, urgency, difficulty urinating and testicular pain.  Musculoskeletal: Negative for myalgias, arthralgias and gait problem.  Skin: Negative for color change and rash.  Neurological: Negative for dizziness, speech difficulty, weakness and numbness.  Hematological: Negative for adenopathy.  Psychiatric/Behavioral: Negative for hallucinations, confusion and agitation.       Objective:   Physical Exam  Constitutional: He is oriented to person, place, and time. He appears well-developed and well-nourished. No distress.  HENT:  Head: Normocephalic.  Mouth/Throat: Oropharynx is clear and moist. No oropharyngeal exudate.  Eyes:  Conjunctivae and EOM are normal. Pupils are equal, round, and reactive to light. No scleral icterus.  Neck: Normal range of motion. No tracheal deviation present.  Cardiovascular: Normal rate, normal heart sounds and intact distal pulses.   Pulmonary/Chest: Effort normal. No respiratory distress.  Abdominal: Soft. He exhibits no distension and no mass. There is no tenderness. There is no rebound and no guarding. Hernia confirmed negative in the right inguinal area and confirmed negative in the left inguinal area.       Incisions clean with normal healing ridges.  No hernias.  All ext drains out  Loop ileostomy pink with thin succus.    Genitourinary:       Foley in.  Clear urine.    Perianal skin clean with good hygiene.  No pruritis.  No pilonidal disease.  No fissure.  No abscess/fistula.    Tolerates digital exam.  Normal sphincter tone.   Stapled anastomosis strictured a little but intact - can pass pinky easily, no obvious hole felt.   Musculoskeletal: Normal range of motion. He exhibits  no tenderness.  Neurological: He is alert and oriented to person, place, and time. No cranial nerve deficit. He exhibits normal muscle tone. Coordination normal.  Skin: Skin is warm and dry. No rash noted. He is not diaphoretic.  Psychiatric: He has a normal mood and affect. His behavior is normal.       Assessment:     7 weeks s/p colostomy takedown w coloanal anastomosis & temporary protective loop ileostomy, recovering    Plan:     I'm hopeful he will continue to improve. I did educate him about if he has worsening pain, energy, fevers, nausea/vomiting; please call to make sure that he is not developing an abscess or a delayed leak.  I think the risk of new post-op problems is fading.  Bladder/ureter leak controlled.  Foley per Urology/Dr. Hillis Range.  Probably around 3-5 months if he has no more problems & the urine leak is sealed, I would plan to do a loop ileostomy takedown.   Plan an  enema preoperatively to make sure there no surprises or strictures. His pelvis was very tight at takedown.  Increase activity as tolerated.  Do not push through pain.  Advanced on diet as tolerated. Bowel regimen to avoid problems.  I gave more educational Handouts on ileostomy care.  Return to clinic 4 weeks. The patient expressed understanding and appreciation

## 2011-02-06 NOTE — Patient Instructions (Signed)
Ileostomy Home Guide An ileostomy is an opening for stool to leave your body when a medical condition prevents it from leaving through the usual opening (rectum). During a surgery, a piece of small intestine (ileum) is brought through a hole in the abdominal wall. The new opening is called a stoma or ostomy. A bag or pouch fits over the stoma to catch stool and gas. Your stool may be liquid at first. Over time, it may become about the consistency of applesauce. CARING FOR YOUR STOMA Normally, the stoma looks a lot like the inside of your cheek: pink and moist. At first, it may be swollen, but this swelling will decrease within 6 weeks. Keep the skin around the stoma clean and dry. You can gently wash your stoma in the shower with a clean, soft washcloth. If you develop any skin irritation, your caregiver may give you a stoma powder or ointment to help heal the area. Do not use any products other than those specifically given to you by your caregiver.  Your stoma should not be uncomfortable. If you notice any stinging or burning, your pouch may be leaking, and the skin around your stoma may be coming into contact with stool. This can cause skin irritation. If you notice stinging, you should replace your pouch with a new one and discard the old one. OSTOMY POUCHES The pouch that fits over the ostomy can be made up of either 1 or 2 pieces. A one-piece pouch has a skin barrier piece and the pouch itself in one unit. A two-piece pouch has a skin barrier with a separate pouch that snaps on and off of the skin barrier. Either way, you should empty the pouch when it is only ? to  full. Do not let more stool or gas build up. This could cause the pouch to leak. Some ostomy bags have a built-in gas release valve. Ostomy deodorizer (5 drops) can be put into the pouch to prevent odor. Some people use an ostomy lubricant to help the stool slide out of the bag more easily and completely.  EMPTYING YOUR OSTOMY POUCH You  may get lessons on how to empty your pouch from a wound-ostomy nurse before you leave the hospital. Here are the basic steps:  Wash your hands with soap and water.   Sit far back on the toilet.   Put pieces of toilet paper into the toilet water. This will prevent splashing as you empty the stool into the toilet bowl.   Unclip or unvelcro the tail end of the pouch.   Unroll the tail and empty stool into the toilet.   Clean the tail with toilet paper.   Reroll the tail, and clip or velcro it closed.   Wash your hands again.  CHANGING YOUR OSTOMY POUCH Change your ostomy pouch about every 3 to 4 days for the first 6 weeks, then every 5 to 7 days. Always change the bag sooner if you begin to notice any discomfort or irritation of the skin around the stoma. When possible, plan to change your ostomy pouching system before eating and drinking as this will lessen the chance of stool coming out during the change. A wound-ostomy nurse may teach you how to change your pouch before you leave the hospital. Here are the basic steps:  Lay out your supplies.   Wash your hands with soap and water.   Carefully remove the old pouch.   Wash the stoma and skin around the stoma and allow  it to dry. Men may be advised to shave any hair around the stoma very carefully. This will make the adhesive stick better.   Use the stoma measuring guide that comes with your pouch set to decide what size hole you will need to cut in the skin barrier piece. Choose the smallest possible size that will hold the stoma but will not touch it.   Use the guide to trace the circle on the back of the skin barrier piece. Cut out the hole.   Hold the skin barrier piece over the stoma to make sure the hole is the correct size.   Remove the adhesive paper backing from the skin barrier piece.   Squeeze stoma paste around the opening of the skin barrier piece.   Clean and dry the skin around the stoma again.   Carefully fit the  skin barrier piece over your stoma.   If you are using a two-piece pouch, snap the pouch onto the skin barrier piece.   Close the tail of the pouch.   Put your hand over the top of the skin barrier piece to help warm it for about 5 minutes, so that it conforms to your body better.   Wash your hands again.  DIET TIPS Because you have a higher risk of a blockage in the first 2 months after getting an ileostomy, you should decrease your fiber intake for that time period. Fibrous foods include:  Celery.   Cabbage (and coleslaw).   Pineapple.   Mushrooms.   Corn and popcorn.   Whole fruits and vegetables, especially with the skins on.   Foods that contain seeds.   Oranges, grapefruit, tangerines, and other citrus fruit.   Nuts.  After about 2 months, you can slowly add these kinds of foods back into your regular diet, as tolerated. Other tips:  Drink about eight 8 oz glasses of water each day.   You can prevent gas by eating slowly and chewing your food thoroughly.   If you feel concerned that you have too much gas, you can cut back on gas-producing foods, such as:   Spicy foods.   Onions and garlic.   Cruciferous vegetables (cabbage, broccoli, cauliflower, Brussels sprouts).   Beans and legumes.   Some cheeses.   Eggs.   Fish.   Bubbly (carbonated) drinks.   Chewing gum.  GENERAL TIPS  You can shower with or without the bag in place.   Always keep the bag snapped on if you are bathing or swimming.   If your bag gets wet, you can dry it with a blow-dryer set to cool.   Avoid wearing tight clothing directly over your stoma so that it does not become irritated or bleed. Tight clothing can also prevent the stool from draining into the pouch which can cause it to leak.   It is helpful to always have an extra skin barrier and pouch with you when traveling. Do not leave them anywhere too warm, as parts of them can melt.   Do not let your seat belt rest on  your stoma. Try to keep the seat belt either above or below your stoma, or use a tiny pillow to cushion it.   You can still participate in sports, but you should avoid activities in which there is a risk of getting hit in the abdomen.   You can still have sex. It is a good idea to empty your pouch prior to sex. Some people and their partners  feel very comfortable seeing the pouch during sex. Others choose to wear lingerie or a T-shirt that covers the device.  SEEK IMMEDIATE MEDICAL CARE IF:  You notice a change in the size or color of the stoma, especially if it becomes very red, purple, black, or pale white.   You have bloody stools or bleeding from the ostomy.   You have abdominal pain, nausea, vomiting, or bloating.   There is anything unusual protruding from the ostomy.   You have irritation or red skin around the ostomy.   No stool is passing from the stoma.   You have diarrhea (requiring more frequent than normal pouch emptying).  Document Released: 01/23/2003 Document Revised: 10/02/2010 Document Reviewed: 06/19/2010 [facility] Patient Information 2012 [place], [place].

## 2011-02-07 ENCOUNTER — Ambulatory Visit: Payer: BC Managed Care – PPO | Admitting: Oncology

## 2011-02-07 ENCOUNTER — Other Ambulatory Visit (HOSPITAL_COMMUNITY): Payer: Self-pay | Admitting: Oncology

## 2011-02-07 ENCOUNTER — Other Ambulatory Visit: Payer: BC Managed Care – PPO | Admitting: Lab

## 2011-02-07 ENCOUNTER — Ambulatory Visit (HOSPITAL_BASED_OUTPATIENT_CLINIC_OR_DEPARTMENT_OTHER): Payer: BC Managed Care – PPO | Admitting: Oncology

## 2011-02-07 ENCOUNTER — Telehealth: Payer: Self-pay | Admitting: Oncology

## 2011-02-07 VITALS — BP 113/71 | HR 76 | Temp 97.0°F | Ht 69.0 in | Wt 147.4 lb

## 2011-02-07 DIAGNOSIS — Z85038 Personal history of other malignant neoplasm of large intestine: Secondary | ICD-10-CM

## 2011-02-07 DIAGNOSIS — C19 Malignant neoplasm of rectosigmoid junction: Secondary | ICD-10-CM

## 2011-02-07 LAB — CBC WITH DIFFERENTIAL/PLATELET
Basophils Absolute: 0 10*3/uL (ref 0.0–0.1)
Eosinophils Absolute: 0.3 10*3/uL (ref 0.0–0.5)
HCT: 32.2 % — ABNORMAL LOW (ref 38.4–49.9)
HGB: 10.6 g/dL — ABNORMAL LOW (ref 13.0–17.1)
MCH: 28.9 pg (ref 27.2–33.4)
MCV: 87.6 fL (ref 79.3–98.0)
NEUT#: 3.7 10*3/uL (ref 1.5–6.5)
NEUT%: 73.8 % (ref 39.0–75.0)
RDW: 18.4 % — ABNORMAL HIGH (ref 11.0–14.6)
lymph#: 0.6 10*3/uL — ABNORMAL LOW (ref 0.9–3.3)

## 2011-02-07 LAB — COMPREHENSIVE METABOLIC PANEL
Albumin: 3.5 g/dL (ref 3.5–5.2)
BUN: 28 mg/dL — ABNORMAL HIGH (ref 6–23)
Calcium: 9.4 mg/dL (ref 8.4–10.5)
Chloride: 106 mEq/L (ref 96–112)
Creatinine, Ser: 1.04 mg/dL (ref 0.50–1.35)
Glucose, Bld: 72 mg/dL (ref 70–99)
Potassium: 4.1 mEq/L (ref 3.5–5.3)

## 2011-02-07 LAB — CEA: CEA: <0.5 ng/mL (ref 0.0–5.0)

## 2011-02-07 LAB — LACTATE DEHYDROGENASE: LDH: 77 U/L — ABNORMAL LOW (ref 94–250)

## 2011-02-07 NOTE — Progress Notes (Signed)
CC:   [name], MD Radene Gunning, M.D., Ph.D. Magnus Sinning) Tenny Craw, M.D. [name], M.D. [name], M.D.  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  Eric Munoz was last seen by [place] on 12/03/2010.  He had completed all of his treatments, initially surgery and then chemoradiation.  The patient had a colostomy and a Port-A-Cath when we saw him on 12/03/2010.  The plan was for him to undergo reversal of his colostomy and removal of his Port-A-Cath.  The patient has had a rather eventful course since we saw him last on 12/03/2010.  Apparently, he went into the hospital on 12/20/2010.  I should mention that he did have a CT scan carried out on 11/29/2010 that showed no evidence of recurrent or metastatic disease.  The operative note from 12/20/2010 runs a little bit more than 7 pages.  Operative findings included dense inflammatory adhesions.  The pelvis was contracted and narrow.  Procedures carried out were lysis of adhesions, laparoscopic-assisted splenic flexure mobilization, laparoscopic colostomy takedown, primary repair of bladder injury, diverting loop ileostomy in the right upper quadrant, removal of the Port-A-Cath.  Following his surgery, the patient apparently had problems that ultimately required the placement of nephrostomy tubes back in December.  The patient was treated with antibiotics although from the imaging studies, these were described as urinomas around the right perinephric collecting system.  The patient's nephrostomy tubes apparently were removed on January 2nd.  The patient does have an ileostomy.  He has lost approximately 20 pounds from the time that we saw him a couple of months ago but seems to be regaining his lost weight, now 147 pounds as compared with 165 pounds back in April.  The patient seems to be getting along fairly  well at the present time. He has had some loose stools for which he takes Imodium 2 to 3 times a day with improvement.  He takes Boost once a day.  He denies any fever, any pain.  His energy seems to be improving.  His diarrhea has improved. He is really without any major complaints today.  As stated, nephrostomy tubes on the right were removed on January 2nd.  PROBLEM LIST: 1. Adenocarcinoma of the rectosigmoid stage IVA, T4b N2a M1a cancer     involving with involvement of periaortic lymph node.  Diagnosis she     was established in July of 2011.  The patient underwent rather     extensive surgery on both July 5th and August 08, 2009.  There were     multiple tumor nodules.  Six out of 26 lymph nodes were involved     along with adjacent small bowel.  There was involvement of a     periaortic lymph node.  There was a close radial margin.  There was     tumor thrombus in a vein.  There was extracapsular extension,     lymphovascular and perineural invasion, invasion of tumor into the     left external iliac artery and left ureter.  KRAS mutation was not     detected, and thus the tumor is KRAS wild type.  Surgery involved a     colostomy and placement of a left ureteral stent.  There was a     question of whether this represented a de novo cancer or possibly     recurrence  from a previous sigmoid well-differentiated     adenocarcinoma  that was resected by Dr. [name] on     03/15/2004.  This tumor arose apparently in a polyp with 0/3 lymph     nodes and was felt to be T1 stage I.  The margin, however, was 0.5     mm.  Post surgical treatments consisted of 7 cycles of FOLFOX with     treatments from 06/26/2009 through 12/24/2009.  The patient     received pelvic radiation with continuous infusion 5-fluorouracil     from 01/07/2010 through 02/15/2009.  The patient then received 3     cycles of 5-FU leucovorin and 5-FU by continuous infusion from     03/04/2010 through 04/01/2010.   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  PET scan on 11/29/2010 showed no     evidence for recurrent or metastatic cancer. 2. Ileostomy. 3. Right leg weakness secondary to femoral nerve injuries following     the patient's surgery in July 2011.  Currently resolved.  MEDICATIONS: 1. Imodium 2 tablets twice a day for control of loose stools. 2. Zofran as needed. 3. Compazine as needed. 4. Boost 1 can a day.  PHYSICAL EXAM:  General:  Eric Munoz looks fairly well, although he has lost some weight.  Currently 147 pounds, 6.4 ounces, height 5 feet, 9 inches, body surface area 1.8 sq m as compared with a weight of 166 pounds back in January of 2012.  Vital Signs:  Blood pressure 113/71. Other vital signs are normal.  HEENT:  There is no scleral icterus. Mouth and pharynx are benign.  Lymphatics:  No peripheral adenopathy palpable.  Heart/Lungs:  Normal.  Chest Wall:  The Port-A-Cath has been removed.  Abdomen:  Well healed following surgery.  The patient now has an ileostomy bag in his right mid abdomen.  I believe he has recently emptied this.  He has some bandages over his right flank.  All of this looks fairly normal.  The patient's abdomen is benign with no organomegaly, masses or tenderness.  Extremities:  No peripheral edema, clubbing, or calf tenderness.  Neurologic:  Exam is grossly normal.  No petechiae or purpura.  LABORATORY DATA:  Today, white count 5.0, ANC 3.7, hemoglobin 10.6, hematocrit 32.2, and platelets 270,000.  Chemistries:  Today, BUN 28, creatinine 1.04.  Albumin 3.5.  Liver function tests are normal.  CEA was less than 0.5.  IMPRESSION AND PLAN:  Eric Munoz seems to be making a nice recovery from his surgery and associated complications.  Unfortunately, instead of having a colostomy now, he has an ileostomy.  Hopefully, there will be a point where this can be reversed.  The patient tells me this may not be for several months.  In  addition to being followed by Dr. Michaell Cowing, he is also being followed by Dr. [name].  The patient seems to be regaining his weight, making a nice recovery.  He remains without evidence for recurrent cancer despite the rather guarded prognosis of his cancer.  We are still not sure whether this represented a recurrence from the sigmoid colon cancer that was resected on March 15, 2004 or a new cancer.  We will plan to see Eric Munoz again in about 2 months at which time we will check CBC, chemistries and CEA.  It looks like he is scheduled for another CT scan of the abdomen and pelvis without IV contrast at some point within the next  few months.  As stated, the patient's most recent PET scan was on 11/29/2010.  Records from the patient's recent medical encounters were reviewed.    ______________________________ Samul Dada, M.D. DSM/MEDQ  D:  02/07/2011  T:  02/07/2011  Job:  [id number]

## 2011-02-07 NOTE — Progress Notes (Signed)
This office note has been dictated.  #[id number]

## 2011-02-07 NOTE — Telephone Encounter (Signed)
appt made and printed for 04/08/11   aom

## 2011-02-27 ENCOUNTER — Encounter (INDEPENDENT_AMBULATORY_CARE_PROVIDER_SITE_OTHER): Payer: Self-pay

## 2011-03-10 ENCOUNTER — Encounter (INDEPENDENT_AMBULATORY_CARE_PROVIDER_SITE_OTHER): Payer: Self-pay | Admitting: Surgery

## 2011-03-10 ENCOUNTER — Ambulatory Visit (INDEPENDENT_AMBULATORY_CARE_PROVIDER_SITE_OTHER): Payer: BC Managed Care – PPO | Admitting: Surgery

## 2011-03-10 VITALS — BP 134/76 | HR 70 | Temp 97.9°F | Resp 18 | Ht 69.0 in | Wt 150.6 lb

## 2011-03-10 DIAGNOSIS — N360 Urethral fistula: Secondary | ICD-10-CM

## 2011-03-10 DIAGNOSIS — Z932 Ileostomy status: Secondary | ICD-10-CM

## 2011-03-10 DIAGNOSIS — Z85038 Personal history of other malignant neoplasm of large intestine: Secondary | ICD-10-CM

## 2011-03-10 NOTE — Progress Notes (Signed)
Subjective:     Patient ID: Eric Munoz, male   DOB: Feb 24, 1940, 71 y.o.   MRN: [id number]  HPI   Eric Munoz  [birth year] [id number]  Patient Care Team: [name], MD as PCP - General (Family Medicine) [name], MD as Consulting Physician (Urology) [name], MD as Consulting Physician (Radiation Oncology) [name], MD as Consulting Physician (Gastroenterology) Samul Dada, MD as Consulting Physician (Hematology and Oncology)  This patient is a 71 y.o.male who presents today for surgical evaluation.   Diagnosis: Metastatic colon cancer status post resection and colostomy  Procedure: Colostomy takedown with protective loop ileostomy for coloanal anastomosis 12/20/2010.  The patient comes in today frustrated.  Dr. Hillis Range removed the catheter, but he noted anal fluid when he strained to urinate.  Foley replaced for presumed persistent leak (urethral?).  No clear liquid drainage out the anus with the Foley back in.  The patient's energy level is good. He denies any fevers or chills.   The patient is eating fine.   Ileostomy output 3-10 empties a day, depending on what he eats. Exercising OK. Urine output seems fine.  Past Medical History  Diagnosis Date  . Easy bruising   . 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 Difficult intubation   . Colon cancer 2007, recurrent Jul2011    Recurrent, Stage 4, [id number] (Left colectomy/LAR , ChemoTx completed Feb2012/ XRT completed Jan2012  . S/P chemotherapy, time since greater than 12 weeks 03-2010 last chemo  . S/P radiation > 12 weeks 02-2010    last radiation  . Hypertension   . Urinoma, s/p bladder repair 01/20/2011    Past Surgical History  Procedure Date  . Portacath placement 12-11-10    09-17-09 right chest/ states will be removed 12-20-10  . Colostomy takedown 12/20/2010    Procedure: LAPAROSCOPIC COLOSTOMY TAKEDOWN;  Surgeon: [name], MD;  Location: [facility];  Service: General;  Laterality: N/A;  Laparoscopy Colostomy Takedownn With Ileostomy  . Port-a-cath removal 12/20/2010    Procedure: REMOVAL PORT-A-CATH;  Surgeon: [name], MD;  Location: [facility];  Service: General;  Laterality: Right;  Right  venous catheter no longer needed.  . Cystoscopy w/ ureteral stent placement 12/20/2010    Procedure: CYSTOSCOPY WITH STENT REPLACEMENT;  Surgeon: [name];  Location: [facility];  Service: Urology;  Laterality: Left;  Cystoscopy/Left Double J Stent Exchange  . Fracture surgery   . Tonsillectomy yrs ago  . Colon surgery 08/07/09    Low Anterior Rescection, resection of L ext iliac artery,  ureterolysis  . Cystoscopy w/ ureteral stent placement 01/13/2011    Procedure: CYSTOSCOPY WITH RETROGRADE PYELOGRAM/URETERAL STENT PLACEMENT;  Surgeon: [name];  Location: [facility];  Service: Urology;  Laterality: Left;  intra-operative cystogram    History   Social History  . Marital Status: Married    Spouse Name: N/A    Number of Children: N/A  . Years of Education: N/A   Occupational History  . Not on file.   Social History Main Topics  . Smoking status: Former Smoker -- 1.0 packs/day for 10 years    Quit date: 02/03/1974  . Smokeless tobacco: Never Used  . Alcohol Use: No  . Drug Use: No  . Sexually Active: Yes   Other Topics Concern  . Not on file   Social History Narrative  . No narrative on file    Family History  Problem Relation Age of Onset  . Stroke Father 45  . Cancer Father     colon  . Lupus Daughter     Current outpatient prescriptions:nutritional supplement (BOOST HIGH PROTEIN) LIQD, Take 1 Can by mouth daily. Per patient taking supplement that is in plastic bottle. Was not an option in database., Disp: , Rfl: ;  acetaminophen (TYLENOL) 325 MG tablet, Take 650 mg by mouth every 6 (six) hours as needed. Pain  , Disp: , Rfl: ;  diphenoxylate-atropine (LOMOTIL) 2.5-0.025 MG per  tablet, Take 1 tablet by mouth as needed.  , Disp: , Rfl:  loperamide (IMODIUM A-D) 2 MG tablet, Take 2 mg by mouth 2 (two) times daily.  , Disp: , Rfl: ;  ondansetron (ZOFRAN) 8 MG tablet, Take 8 mg by mouth every 8 (eight) hours as needed.  , Disp: , Rfl: ;  prochlorperazine (COMPAZINE) 10 MG tablet, Take 10 mg by mouth every 6 (six) hours as needed.  , Disp: , Rfl: ;  psyllium (METAMUCIL) 58.6 % powder, Take 1 packet by mouth 2 (two) times daily.  , Disp: , Rfl:   No Known Allergies  BP 134/76  Pulse 70  Temp(Src) 97.9 F (36.6 C) (Temporal)  Resp 18  Ht 5\' 9"  (1.753 m)  Wt 150 lb 9.6 oz (68.312 kg)  BMI 22.24 kg/m2     Review of Systems  Constitutional: Negative for fever, chills and diaphoresis.  HENT: Negative for sore throat, trouble swallowing and neck pain.   Eyes: Negative for photophobia and visual disturbance.  Respiratory: Negative for choking and shortness of breath.   Cardiovascular: Negative for chest pain and palpitations.  Gastrointestinal: Negative for nausea, vomiting, abdominal distention, anal bleeding and rectal pain.  Genitourinary: Negative for dysuria, urgency, difficulty urinating and testicular pain.  Musculoskeletal: Negative for myalgias, arthralgias and gait problem.  Skin: Negative for color change and rash.  Neurological: Negative for dizziness, speech difficulty, weakness and numbness.  Hematological: Negative for adenopathy.  Psychiatric/Behavioral: Negative for hallucinations, confusion and agitation.       Objective:   Physical Exam  Constitutional: He is oriented to person, place, and time. He appears well-developed and well-nourished. No distress.  HENT:  Head: Normocephalic.  Mouth/Throat: Oropharynx is clear and moist. No oropharyngeal exudate.  Eyes: Conjunctivae and EOM are normal. Pupils are equal, round, and reactive to light. No scleral icterus.  Neck: Normal range of motion. No tracheal deviation present.  Cardiovascular: Normal  rate, normal heart sounds and intact distal pulses.   Pulmonary/Chest: Effort normal. No respiratory distress.  Abdominal: Soft. He exhibits no distension. There is no tenderness. Hernia confirmed negative in the right inguinal area and confirmed negative in the left inguinal area.       Incisions clean with normal healing ridges.  No hernias. Loop ileostomy pink with thin succus.    Genitourinary:       Foley in.  Clear urine.      Musculoskeletal: Normal range of motion. He exhibits no tenderness.  Neurological: He is alert and oriented to person, place, and time. No cranial nerve deficit. He exhibits normal muscle tone. Coordination normal.  Skin: Skin is warm and dry. No rash noted. He is not diaphoretic.  Psychiatric: He has a normal mood and affect. His behavior is normal.       Assessment:     2.5 months s/p colostomy takedown w coloanal anastomosis & temporary protective loop ileostomy, recovering    Plan:     I'm  hopeful he will continue to improve.  At least the risk of new post-op problems is fading.  He is frustrated, but trying to be patient.  He was high risk for problems, but most of them are gone now, so I am trying to show improvement.  Bladder/ureter leak controlled.  Foley per Urology/Dr. Hillis Range.  I will try to d/w him to see what else needs to be done.  Hopefully we can avoid a fistula closure/repair & let the body heal the fistula over time  Probably around Spring/Summer if he has no more problems & the urine leak is sealed, I would plan to do a loop ileostomy takedown.  Plan an enema preoperatively to make sure there no surprises or strictures. His pelvis was very tight at takedown.  Return to clinic 4 weeks. The patient expressed understanding and appreciation

## 2011-04-08 ENCOUNTER — Other Ambulatory Visit: Payer: BC Managed Care – PPO | Admitting: Lab

## 2011-04-08 ENCOUNTER — Ambulatory Visit: Payer: BC Managed Care – PPO | Admitting: Oncology

## 2011-04-10 ENCOUNTER — Encounter (INDEPENDENT_AMBULATORY_CARE_PROVIDER_SITE_OTHER): Payer: Self-pay | Admitting: Surgery

## 2011-04-10 ENCOUNTER — Ambulatory Visit (INDEPENDENT_AMBULATORY_CARE_PROVIDER_SITE_OTHER): Payer: BC Managed Care – PPO | Admitting: Surgery

## 2011-04-10 VITALS — BP 136/76 | HR 82 | Temp 98.0°F | Ht 69.5 in | Wt 149.4 lb

## 2011-04-10 DIAGNOSIS — N360 Urethral fistula: Secondary | ICD-10-CM

## 2011-04-10 DIAGNOSIS — Z932 Ileostomy status: Secondary | ICD-10-CM

## 2011-04-10 NOTE — Progress Notes (Signed)
Subjective:     Patient ID: Eric Munoz, male   DOB: Dec 22, 1940, 71 y.o.   MRN: [id number]  HPI   Eric Munoz  [birth year] [id number]  Patient Care Team: [name], MD as PCP - General (Family Medicine) [name], MD as Consulting Physician (Urology) [name], MD as Consulting Physician (Radiation Oncology) [name], MD as Consulting Physician (Gastroenterology) Samul Dada, MD as Consulting Physician (Hematology and Oncology)  This patient is a 71 y.o.male who presents today for surgical evaluation.   Diagnosis: Metastatic colon cancer status post resection and colostomy  Procedure: Colostomy takedown with protective loop ileostomy for coloanal anastomosis 12/20/2010.  The patient comes in today frustrated.  Dr. Hillis Range removed the catheter, but he noted anal fluid when he strained to urinate.  Foley replaced for presumed persistent leak (urethral?).  No clear liquid drainage out the anus with the Foley back in.  The patient's energy level is good. He denies any fevers or chills.   The patient is eating fine.   Ileostomy output 3-10 empties a day, depending on what he eats. Exercising OK. Urine output seems fine.  Past Medical History  Diagnosis Date  . Easy bruising   . 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 Difficult intubation   . Colon cancer 2007, recurrent Jul2011    Recurrent, Stage 4, [id number] (Left colectomy/LAR , ChemoTx completed Feb2012/ XRT completed Jan2012  . S/P chemotherapy, time since greater than 12 weeks 03-2010 last chemo  . S/P radiation > 12 weeks 02-2010    last radiation  . Hypertension   . Urinoma, s/p bladder repair 01/20/2011  . Acute renal failure (ARF), resolving 12/23/2010    Past Surgical History  Procedure Date  . Portacath placement 12-11-10    09-17-09 right chest/ states will be removed 12-20-10  . Colostomy takedown 12/20/2010    Procedure:  LAPAROSCOPIC COLOSTOMY TAKEDOWN;  Surgeon: [name], MD;  Location: [facility];  Service: General;  Laterality: N/A;  Laparoscopy Colostomy Takedownn With Ileostomy  . Port-a-cath removal 12/20/2010    Procedure: REMOVAL PORT-A-CATH;  Surgeon: [name], MD;  Location: [facility];  Service: General;  Laterality: Right;  Right  venous catheter no longer needed.  . Cystoscopy w/ ureteral stent placement 12/20/2010    Procedure: CYSTOSCOPY WITH STENT REPLACEMENT;  Surgeon: [name];  Location: [facility];  Service: Urology;  Laterality: Left;  Cystoscopy/Left Double J Stent Exchange  . Fracture surgery   . Tonsillectomy yrs ago  . Colon surgery 08/07/09    Low Anterior Rescection, resection of L ext iliac artery,  ureterolysis  . Cystoscopy w/ ureteral stent placement 01/13/2011    Procedure: CYSTOSCOPY WITH RETROGRADE PYELOGRAM/URETERAL STENT PLACEMENT;  Surgeon: [name];  Location: [facility];  Service: Urology;  Laterality: Left;  intra-operative cystogram    History   Social History  . Marital Status: Married    Spouse Name: N/A    Number of Children: N/A  . Years of Education: N/A   Occupational History  . Not on file.   Social History Main Topics  . Smoking status: Former Smoker -- 1.0 packs/day for 10 years    Quit date: 02/03/1974  . Smokeless tobacco: Never Used  . Alcohol Use: No  . Drug Use: No  . Sexually Active: Yes   Other Topics Concern  . Not on file   Social History Narrative  . No  narrative on file    Family History  Problem Relation Age of Onset  . Stroke Father 53  . Cancer Father     colon  . Lupus Daughter     Current outpatient prescriptions:acetaminophen (TYLENOL) 325 MG tablet, Take 650 mg by mouth every 6 (six) hours as needed. Pain  , Disp: , Rfl: ;  diphenoxylate-atropine (LOMOTIL) 2.5-0.025 MG per tablet, Take 1 tablet by mouth as needed.  , Disp: , Rfl: ;  loperamide (IMODIUM A-D) 2 MG tablet, Take 2 mg by mouth 2 (two) times  daily.  , Disp: , Rfl:  nutritional supplement (BOOST HIGH PROTEIN) LIQD, Take 1 Can by mouth daily. Per patient taking supplement that is in plastic bottle. Was not an option in database., Disp: , Rfl: ;  ondansetron (ZOFRAN) 8 MG tablet, Take 8 mg by mouth every 8 (eight) hours as needed.  , Disp: , Rfl: ;  prochlorperazine (COMPAZINE) 10 MG tablet, Take 10 mg by mouth every 6 (six) hours as needed.  , Disp: , Rfl:  psyllium (METAMUCIL) 58.6 % powder, Take 1 packet by mouth 2 (two) times daily.  , Disp: , Rfl:   No Known Allergies  BP 136/76  Pulse 82  Temp(Src) 98 F (36.7 C) (Temporal)  Ht 5' 9.5" (1.765 m)  Wt 149 lb 6.4 oz (67.767 kg)  BMI 21.75 kg/m2  SpO2 100%     Review of Systems  Constitutional: Negative for fever, chills and diaphoresis.  HENT: Negative for sore throat, trouble swallowing and neck pain.   Eyes: Negative for photophobia and visual disturbance.  Respiratory: Negative for choking and shortness of breath.   Cardiovascular: Negative for chest pain and palpitations.  Gastrointestinal: Negative for nausea, vomiting, abdominal distention, anal bleeding and rectal pain.  Genitourinary: Negative for dysuria, urgency, difficulty urinating and testicular pain.  Musculoskeletal: Negative for myalgias, arthralgias and gait problem.  Skin: Negative for color change and rash.  Neurological: Negative for dizziness, speech difficulty, weakness and numbness.  Hematological: Negative for adenopathy.  Psychiatric/Behavioral: Negative for hallucinations, confusion and agitation.       Objective:   Physical Exam  Constitutional: He is oriented to person, place, and time. He appears well-developed and well-nourished. No distress.  HENT:  Head: Normocephalic.  Mouth/Throat: Oropharynx is clear and moist. No oropharyngeal exudate.  Eyes: Conjunctivae and EOM are normal. Pupils are equal, round, and reactive to light. No scleral icterus.  Neck: Normal range of motion. No  tracheal deviation present.  Cardiovascular: Normal rate, normal heart sounds and intact distal pulses.   Pulmonary/Chest: Effort normal. No respiratory distress.  Abdominal: Soft. He exhibits no distension. There is no tenderness. Hernia confirmed negative in the right inguinal area and confirmed negative in the left inguinal area.       Incisions clean with normal healing ridges.  No hernias. Loop ileostomy pink with thin succus.    Genitourinary:       Foley in.  Clear urine.    Perianal skin clean with good hygiene.  No pruritis.  No pilonidal disease.  No fissure.  No abscess/fistula.    Tolerates digital rectal exam.  Normal sphincter tone.  Stricture at anastomosis.  2.5cm from anal verge - I dilated w my index finger.  Munoz felt at anastomosis.  No obvious fistula felt.   Musculoskeletal: Normal range of motion. He exhibits no tenderness.  Neurological: He is alert and oriented to person, place, and time. No cranial nerve deficit. He exhibits normal muscle  tone. Coordination normal.  Skin: Skin is warm and dry. No rash noted. He is not diaphoretic.  Psychiatric: He has a normal mood and affect. His behavior is normal.       Assessment:     Almost 4 months s/p colostomy takedown w coloanal anastomosis & temporary protective loop ileostomy, recovering    Plan:     I'm hopeful he will continue to improve.  At least the risk of new post-op problems is fading.  He is patient.  He was high risk for problems, but most of them are gone now, so I am trying to show improvement.  Bladder vs ?urethral leak controlled.  Foley per Urology/Dr. Hillis Range.  I will try to d/w him to see what else needs to be done.  Pt seeing Urology tomorrow Retrograde urethrogram when appropriate.   If no urine leak, then get an antegrade contrast enema through the ileostomy (it is a loop - the lower orifice is antegrade to the colon.  Hopefully we can avoid a fistula closure/repair & let the body heal  the fistula over time.   If fistula persistent >6 months, may need gracilis flap to close fistula down vs keeping the ileostomy indefinitely.  He is at risk for problems w each surgery given fibrosis/radiation, etc  Probably around Spring/Summer if he has no more problems & the urine leak is sealed, I would plan to do a loop ileostomy takedown.  His pelvis was very tight at takedown.  Return to clinic 6 weeks. The patient expressed understanding and appreciation

## 2011-04-11 ENCOUNTER — Other Ambulatory Visit: Payer: Self-pay | Admitting: Urology

## 2011-04-11 DIAGNOSIS — N329 Bladder disorder, unspecified: Secondary | ICD-10-CM

## 2011-04-30 ENCOUNTER — Ambulatory Visit (HOSPITAL_COMMUNITY)
Admission: RE | Admit: 2011-04-30 | Discharge: 2011-04-30 | Disposition: A | Discharge status: Home / Self Care | Payer: BC Managed Care – PPO | Source: Ambulatory Visit | Attending: Urology | Admitting: Urology

## 2011-04-30 DIAGNOSIS — N321 Vesicointestinal fistula: Secondary | ICD-10-CM | POA: Insufficient documentation

## 2011-04-30 DIAGNOSIS — Z933 Colostomy status: Secondary | ICD-10-CM | POA: Insufficient documentation

## 2011-04-30 DIAGNOSIS — C189 Malignant neoplasm of colon, unspecified: Secondary | ICD-10-CM | POA: Insufficient documentation

## 2011-04-30 DIAGNOSIS — N329 Bladder disorder, unspecified: Secondary | ICD-10-CM

## 2011-04-30 MED ORDER — DIATRIZOATE MEGLUMINE 30 % UR SOLN: Freq: Once | URETHRAL | Status: AC | PRN

## 2011-05-06 ENCOUNTER — Telehealth: Payer: Self-pay | Admitting: Oncology

## 2011-05-06 NOTE — Telephone Encounter (Signed)
pt called and ;/m to r/s 3/5,called and r/s to 06/10/11  aom

## 2011-05-23 ENCOUNTER — Encounter (INDEPENDENT_AMBULATORY_CARE_PROVIDER_SITE_OTHER): Payer: Self-pay | Admitting: Surgery

## 2011-05-23 ENCOUNTER — Ambulatory Visit (INDEPENDENT_AMBULATORY_CARE_PROVIDER_SITE_OTHER): Payer: BC Managed Care – PPO | Admitting: Surgery

## 2011-05-23 VITALS — BP 130/70 | HR 84 | Temp 97.4°F | Ht 69.0 in | Wt 139.0 lb

## 2011-05-23 DIAGNOSIS — Z932 Ileostomy status: Secondary | ICD-10-CM

## 2011-05-23 DIAGNOSIS — Z860100 Personal history of colon polyps, unspecified: Secondary | ICD-10-CM

## 2011-05-23 DIAGNOSIS — N321 Vesicointestinal fistula: Secondary | ICD-10-CM

## 2011-05-23 DIAGNOSIS — N360 Urethral fistula: Secondary | ICD-10-CM | POA: Insufficient documentation

## 2011-05-23 DIAGNOSIS — Z85038 Personal history of other malignant neoplasm of large intestine: Secondary | ICD-10-CM

## 2011-05-23 DIAGNOSIS — N135 Crossing vessel and stricture of ureter without hydronephrosis: Secondary | ICD-10-CM

## 2011-05-23 NOTE — Patient Instructions (Signed)
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 Hillis Range  vs getting a 2nd opinion at [facility]

## 2011-05-23 NOTE — Progress Notes (Signed)
Subjective:     Patient ID: Eric Munoz, male   DOB: 04/17/1940, 71 y.o.   MRN: [id number]  HPI   Eric Munoz  07-14-1940 [id number]  Patient Care Team: [name], MD as PCP - General (Family Medicine) [name], MD as Consulting Physician (Urology) [name], MD as Consulting Physician (Radiation Oncology) [name], MD as Consulting Physician (Gastroenterology) Samul Dada, MD as Consulting Physician (Hematology and Oncology)  This patient is a 71 y.o.male who presents today for surgical evaluation.   Diagnosis: Metastatic colon cancer status post resection and colostomy  Procedure: Colostomy takedown with protective loop ileostomy for coloanal anastomosis 12/20/2010.  Patient comes in noting that the bladder leak has recurred. Dr. Lenn Sink saw the patient. He feels that a surgical repair is required. He sent the patient back to me to see the what options are available.  No clear liquid drainage out the anus with the Foley back in.  The patient's energy level is good. He denies any fevers or chills.   The patient is eating fine.   Ileostomy output stable. Exercising OK. Urine output seems fine.  Past Medical History  Diagnosis Date  . Easy bruising   . 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 Difficult intubation   . Colon cancer 2007, recurrent Jul2011    Recurrent, Stage 4, [id number] (Left colectomy/LAR , ChemoTx completed Feb2012/ XRT completed Jan2012  . S/P chemotherapy, time since greater than 12 weeks 03-2010 last chemo  . S/P radiation > 12 weeks 02-2010    last radiation  . Hypertension   . Urinoma, s/p bladder repair 01/20/2011  . Acute renal failure (ARF), resolving 12/23/2010    Past Surgical History  Procedure Date  . Portacath placement 12-11-10    09-17-09 right chest/ states will be removed 12-20-10  . Colostomy takedown 12/20/2010    Procedure: LAPAROSCOPIC  COLOSTOMY TAKEDOWN;  Surgeon: [name], MD;  Location: [facility];  Service: General;  Laterality: N/A;  Laparoscopy Colostomy Takedownn With Ileostomy  . Port-a-cath removal 12/20/2010    Procedure: REMOVAL PORT-A-CATH;  Surgeon: [name], MD;  Location: [facility];  Service: General;  Laterality: Right;  Right  venous catheter no longer needed.  . Cystoscopy w/ ureteral stent placement 12/20/2010    Procedure: CYSTOSCOPY WITH STENT REPLACEMENT;  Surgeon: [name];  Location: [facility];  Service: Urology;  Laterality: Left;  Cystoscopy/Left Double J Stent Exchange  . Fracture surgery   . Tonsillectomy yrs ago  . Colon surgery 08/07/09    Low Anterior Rescection, resection of L ext iliac artery,  ureterolysis  . Cystoscopy w/ ureteral stent placement 01/13/2011    Procedure: CYSTOSCOPY WITH RETROGRADE PYELOGRAM/URETERAL STENT PLACEMENT;  Surgeon: [name];  Location: [facility];  Service: Urology;  Laterality: Left;  intra-operative cystogram    History   Social History  . Marital Status: Married    Spouse Name: N/A    Number of Children: N/A  . Years of Education: N/A   Occupational History  . Not on file.   Social History Main Topics  . Smoking status: Former Smoker -- 1.0 packs/day for 10 years    Quit date: 02/03/1974  . Smokeless tobacco: Never Used  . Alcohol Use: No  . Drug Use: No  . Sexually Active: Yes   Other Topics Concern  . Not on file   Social History Narrative  . No narrative  on file    Family History  Problem Relation Age of Onset  . Stroke Father 72  . Cancer Father     colon  . Lupus Daughter     Current outpatient prescriptions:acetaminophen (TYLENOL) 325 MG tablet, Take 650 mg by mouth every 6 (six) hours as needed. Pain  , Disp: , Rfl: ;  diphenoxylate-atropine (LOMOTIL) 2.5-0.025 MG per tablet, Take 1 tablet by mouth as needed.  , Disp: , Rfl: ;  loperamide (IMODIUM A-D) 2 MG tablet, Take 2 mg by mouth 2 (two) times daily.  , Disp:  , Rfl:  nutritional supplement (BOOST HIGH PROTEIN) LIQD, Take 1 Can by mouth daily. Per patient taking supplement that is in plastic bottle. Was not an option in database., Disp: , Rfl: ;  ondansetron (ZOFRAN) 8 MG tablet, Take 8 mg by mouth every 8 (eight) hours as needed.  , Disp: , Rfl: ;  prochlorperazine (COMPAZINE) 10 MG tablet, Take 10 mg by mouth every 6 (six) hours as needed.  , Disp: , Rfl:  psyllium (METAMUCIL) 58.6 % powder, Take 1 packet by mouth 2 (two) times daily.  , Disp: , Rfl: ;  sulfamethoxazole-trimethoprim (SMZ-TMP DS) 800-160 MG per tablet, Take 1 tablet by mouth 2 (two) times daily., Disp: , Rfl:   No Known Allergies  BP 130/70  Pulse 84  Temp(Src) 97.4 F (36.3 C) (Temporal)  Ht 5\' 9"  (1.753 m)  Wt 139 lb (63.05 kg)  BMI 20.53 kg/m2  SpO2 98%     Review of Systems  Constitutional: Negative for fever, chills and diaphoresis.  HENT: Negative for sore throat, trouble swallowing and neck pain.   Eyes: Negative for photophobia and visual disturbance.  Respiratory: Negative for choking and shortness of breath.   Cardiovascular: Negative for chest pain and palpitations.  Gastrointestinal: Negative for nausea, vomiting, abdominal distention, anal bleeding and rectal pain.  Genitourinary: Negative for dysuria, urgency, difficulty urinating and testicular pain.  Musculoskeletal: Negative for myalgias, arthralgias and gait problem.  Skin: Negative for color change and rash.  Neurological: Negative for dizziness, speech difficulty, weakness and numbness.  Hematological: Negative for adenopathy.  Psychiatric/Behavioral: Negative for hallucinations, confusion and agitation.       Objective:   Physical Exam  Constitutional: He is oriented to person, place, and time. He appears well-developed and well-nourished. No distress.  HENT:  Head: Normocephalic.  Mouth/Throat: Oropharynx is clear and moist. No oropharyngeal exudate.  Eyes: Conjunctivae and EOM are normal.  Pupils are equal, round, and reactive to light. No scleral icterus.  Neck: Normal range of motion. No tracheal deviation present.  Cardiovascular: Normal rate, normal heart sounds and intact distal pulses.   Pulmonary/Chest: Effort normal. No respiratory distress.  Abdominal: Soft. He exhibits no distension. There is no tenderness. Hernia confirmed negative in the right inguinal area and confirmed negative in the left inguinal area.       Incisions clean with normal healing ridges.  No hernias. Loop ileostomy pink with thin succus.    Genitourinary:       Foley in.  Clear urine.    Perianal skin clean with good hygiene.  No pruritis.  No pilonidal disease.  No fissure.  No abscess/fistula.    Tolerates digital rectal exam.  Normal sphincter tone.  Stricture at anastomosis.  2.5cm from anal verge - I dilated w my index finger.  Staples felt at anastomosis.  No obvious fistula felt.   Musculoskeletal: Normal range of motion. He exhibits no tenderness.  Neurological:  He is alert and oriented to person, place, and time. No cranial nerve deficit. He exhibits normal muscle tone. Coordination normal.  Skin: Skin is warm and dry. No rash noted. He is not diaphoretic.  Psychiatric: He has a normal mood and affect. His behavior is normal.       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   Plan:     This is a challenging situation. I believe he needs surgery to separate the colon from the bladder. He will need some tissue to interpose between these structures. Hopefully some omentum left to do this. Another option would be to try and do a peritoneal flap. A more aggressive option would be to mobilize a muscle pedicle flap such as a VRAM, biceps femoris, etc.  He is at higher risk for complications given his radiation leaving a narrow  stricture pelvis.  I did discuss the procedure with him:  The anatomy & physiology of the digestive tract was discussed.  The pathophysiology of  fistula between the bowel and bladder was discussed.  Natural history risks without surgery was discussed. I worked to give an overview of the disease and the frequent need to have multispecialty involvement.   I feel the risks of no intervention will lead to serious problems that outweigh the operative risks; therefore, I recommended surgery to treat the pathology.  Laparoscopic & open techniques for bladder repair were discussed. Perhaps hand-assisted mobilization of the omentum is reasonable, but the repair itself should be an open fashion.  While I normally would resect the colon involved in the fistula and bring down a new anastomosis, his pelvis is so strictured that I don't think that is a great idea. It is just a focal area, hopefully I can just do a colon wall resection and primary closure. I would like to avoid creating another anastomosis. Continued fecal diversion by ileostomy was discussed.  We will work to preserve anal & pelvic floor function without sacrificing cure.  Need for prolonged bladder catheterization was discussed.  If that fails, he may need to be converted to an end colostomy.  Risks such as bleeding, infection, abscess, leak, recurrence with reoperation, possible ostomy, hernia, heart attack, death, and other risks were discussed.  I noted a good likelihood this will help address the problem.   Goals of post-operative recovery were discussed as well.  We will work to minimize complications.  An educational handout on the pathology was given as well.  Questions were answered.    The patient expresses understanding & wishes to proceed with surgery.  I again discussed with him the option of going to a mother facility to get a second opinion. Pedal to see what options are available from their perspective.  Right now he claims he wishes to  stay within our community.  I want to make sure that his family is comfortable with this as well. He wishes to discuss with his wife as well.

## 2011-05-26 ENCOUNTER — Telehealth (INDEPENDENT_AMBULATORY_CARE_PROVIDER_SITE_OTHER): Payer: Self-pay | Admitting: Surgery

## 2011-05-26 NOTE — Telephone Encounter (Signed)
Returned pt's call. The pt has decided to have surgery by Dr Michaell Cowing. The pt wants to know if Dr Michaell Cowing is not able to do the surgery once he gets in there b/c of the cancer will the pt have a permanent colostomy vs. An ileostomy. The pt would rather have a colostomy instead of ileostomy per the pt. Please advise. I will need orders completed in writing and epic.

## 2011-05-30 ENCOUNTER — Other Ambulatory Visit (INDEPENDENT_AMBULATORY_CARE_PROVIDER_SITE_OTHER): Payer: Self-pay | Admitting: Surgery

## 2011-06-03 ENCOUNTER — Telehealth (INDEPENDENT_AMBULATORY_CARE_PROVIDER_SITE_OTHER): Payer: Self-pay | Admitting: Surgery

## 2011-06-04 ENCOUNTER — Telehealth (INDEPENDENT_AMBULATORY_CARE_PROVIDER_SITE_OTHER): Payer: Self-pay

## 2011-06-04 NOTE — Telephone Encounter (Signed)
LMOM for pt to call me back.

## 2011-06-04 NOTE — Telephone Encounter (Signed)
LMOM for pt to call me about sx.

## 2011-06-05 ENCOUNTER — Encounter (HOSPITAL_COMMUNITY): Payer: Self-pay | Admitting: Pharmacy Technician

## 2011-06-06 ENCOUNTER — Other Ambulatory Visit: Payer: Self-pay | Admitting: Urology

## 2011-06-09 ENCOUNTER — Encounter (HOSPITAL_COMMUNITY): Payer: Self-pay

## 2011-06-09 ENCOUNTER — Encounter (HOSPITAL_COMMUNITY)
Admission: RE | Admit: 2011-06-09 | Discharge: 2011-06-09 | Disposition: A | Discharge status: Home / Self Care | Payer: BC Managed Care – PPO | Source: Ambulatory Visit | Attending: Surgery | Admitting: Surgery

## 2011-06-09 LAB — CBC
HCT: 31 % — ABNORMAL LOW (ref 39.0–52.0)
Platelets: 297 10*3/uL (ref 150–400)
RDW: 17.2 % — ABNORMAL HIGH (ref 11.5–15.5)
WBC: 5.5 10*3/uL (ref 4.0–10.5)

## 2011-06-09 LAB — BASIC METABOLIC PANEL
Chloride: 105 mEq/L (ref 96–112)
GFR calc Af Amer: 47 mL/min — ABNORMAL LOW (ref >90)
Potassium: 4.8 mEq/L (ref 3.5–5.1)

## 2011-06-09 LAB — SURGICAL PCR SCREEN: Staphylococcus aureus: NEGATIVE

## 2011-06-09 NOTE — Pre-Procedure Instructions (Signed)
Please check pt cbc and bmet results

## 2011-06-09 NOTE — Patient Instructions (Addendum)
20 Eric Munoz  06/09/2011   Your procedure is scheduled on:  06-19-2011 THURSDAY  Report to [facility] Stay Center at  [phone].  Call this number if you have problems the morning of surgery: [phone]   Remember:   Do not eat food or drink liquids:After Midnight.  .  Take these medicines the morning of surgery with A SIP OF WATER: NO MEDS TO TAKE   Do not wear jewelry or make up.  Do not wear lotions, powders, or perfumes.Do not wear deodorant.    Do not bring valuables to the hospital.  Contacts, dentures or bridgework may not be worn into surgery.  Leave suitcase in the car. After surgery it may be brought to your room.  For patients admitted to the hospital, checkout time is 11:00 AM the day of discharge.     Special Instructions: CHG Shower Use Special Wash: 1/2 bottle night before surgery and 1/2 bottle morning of surgery.neck down avoid private area   Please read over the following fact sheets that you were given: MRSA Information. BLOOD FACT SHEET  [name] pre op nurse phone number [phone], call if needed

## 2011-06-10 ENCOUNTER — Encounter: Payer: Self-pay | Admitting: Oncology

## 2011-06-10 ENCOUNTER — Other Ambulatory Visit (HOSPITAL_BASED_OUTPATIENT_CLINIC_OR_DEPARTMENT_OTHER): Payer: BC Managed Care – PPO | Admitting: Lab

## 2011-06-10 ENCOUNTER — Telehealth: Payer: Self-pay | Admitting: *Deleted

## 2011-06-10 ENCOUNTER — Ambulatory Visit (HOSPITAL_BASED_OUTPATIENT_CLINIC_OR_DEPARTMENT_OTHER): Payer: BC Managed Care – PPO | Admitting: Oncology

## 2011-06-10 VITALS — BP 133/75 | HR 96 | Temp 97.1°F | Ht 69.0 in | Wt 140.5 lb

## 2011-06-10 DIAGNOSIS — D539 Nutritional anemia, unspecified: Secondary | ICD-10-CM | POA: Insufficient documentation

## 2011-06-10 DIAGNOSIS — D649 Anemia, unspecified: Secondary | ICD-10-CM

## 2011-06-10 DIAGNOSIS — C19 Malignant neoplasm of rectosigmoid junction: Secondary | ICD-10-CM

## 2011-06-10 DIAGNOSIS — R634 Abnormal weight loss: Secondary | ICD-10-CM

## 2011-06-10 DIAGNOSIS — Z85038 Personal history of other malignant neoplasm of large intestine: Secondary | ICD-10-CM

## 2011-06-10 DIAGNOSIS — N289 Disorder of kidney and ureter, unspecified: Secondary | ICD-10-CM

## 2011-06-10 LAB — COMPREHENSIVE METABOLIC PANEL
AST: 18 U/L (ref 0–37)
Albumin: 3.6 g/dL (ref 3.5–5.2)
BUN: 42 mg/dL — ABNORMAL HIGH (ref 6–23)
Calcium: 10 mg/dL (ref 8.4–10.5)
Chloride: 107 mEq/L (ref 96–112)
Glucose, Bld: 85 mg/dL (ref 70–99)
Potassium: 4.2 mEq/L (ref 3.5–5.3)

## 2011-06-10 LAB — CBC WITH DIFFERENTIAL/PLATELET
Basophils Absolute: 0 10*3/uL (ref 0.0–0.1)
HCT: 29.9 % — ABNORMAL LOW (ref 38.4–49.9)
HGB: 9.9 g/dL — ABNORMAL LOW (ref 13.0–17.1)
MONO#: 0.7 10*3/uL (ref 0.1–0.9)
NEUT#: 4.7 10*3/uL (ref 1.5–6.5)
NEUT%: 79.7 % — ABNORMAL HIGH (ref 39.0–75.0)
WBC: 5.9 10*3/uL (ref 4.0–10.3)
lymph#: 0.5 10*3/uL — ABNORMAL LOW (ref 0.9–3.3)

## 2011-06-10 LAB — CEA: CEA: 0.9 ng/mL (ref 0.0–5.0)

## 2011-06-10 NOTE — Telephone Encounter (Signed)
gave patient appointment for pet scan on 06-16-2011 at 12:30pm nothing to eat or drink 6 hours prior to the visit gave patient appointment for 08-11-2011 starting at 10:30am with labs printed out  calendar and gave to the patient

## 2011-06-10 NOTE — Progress Notes (Signed)
CC:   [name], MD Radene Gunning, M.D., Ph.D. Magnus Sinning) Tenny Craw, M.D. [name], M.D. [name], M.D.   PROBLEM LIST:  1. 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 Diagnosis she  was established in July of 2011. The patient underwent rather  extensive surgery on both July 5th and August 08, 2009. There were  multiple tumor nodules. Six out of 26 lymph nodes were involved  along with adjacent small bowel. There was involvement of a  periaortic lymph node. There was a close radial margin. There was  tumor thrombus in a vein. There was extracapsular extension,  lymphovascular and perineural invasion, invasion of tumor into the  left external iliac artery and left ureter. KRAS mutation was not  detected, and thus the tumor is KRAS wild type. Surgery involved a  colostomy and placement of a left ureteral stent. There was a  question of whether this represented a de novo cancer or possibly  recurrence from a previous sigmoid well-differentiated  adenocarcinoma that was resected by Dr. [name] on  03/15/2004. This tumor arose apparently in a polyp with 0/3 lymph  nodes and was felt to be T1 stage I. The margin, however, was 0.5  mm. Post surgical treatments consisted of 7 cycles of FOLFOX with  treatments from 06/26/2009 through 12/24/2009. The patient  received pelvic radiation with continuous infusion 5-fluorouracil  from 01/07/2010 through 02/15/2009. The patient then received 3  cycles of 5-FU leucovorin and 5-FU by continuous infusion from  03/04/2010 through 04/01/2010. 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 PET scan on 11/29/2010 showed no  evidence for recurrent or metastatic cancer.  2. Ileostomy.  3. Right leg weakness secondary to femoral nerve injuries following  the patient's surgery in July 2011. Currently resolved. 4. The patient underwent  extensive surgery on December 20, 2010.  This     consisted of lysis of adhesions, laparoscopic-assisted splenic     flexure mobilization, laparoscopic colostomy takedown, primary     repair of bladder injury, diverting loop ileostomy in the right     upper quadrant, and removal of the patient's Port-A-Cath. 5. Bilateral ureteral stents. 6. 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 4. Progressive weight loss. 5. Anemia noted in January 2013 with prior history of iron-deficiency     anemia requiring intravenous Feraheme dating back to August 2011. 6. Development of renal insufficiency noted in May 2013.   MEDICATIONS: 1. Imodium 2 tablets 3 times a day. 2. Boost 1 can daily.  The patient has been urged to increase this in     view of his weight loss.  HISTORY:  I am seeing Eric Munoz today for followup of his adenocarcinoma of the rectosigmoid, stage IVA, T4b N2a M1a, with carcinoma involving periaortic lymph nodes at the time of his surgery in early July 2011.  The patient's tumor is KRAS wild type.  As of imaging studies carried out approximately 6 weeks ago, the patient seems to be disease-free.  He was last seen by [place] on 02/07/2011 following rather extensive surgery on 12/20/2010 that was done in the hopes of reversing his colostomy. Details of that surgery can be found in the operative report.  From the notes of Drs. Gross and Dahlstedt as well as the patient's supplemental history, apparently he has developed a colovesical fistula.  He has bilateral ureteral stents, a diverting ileostomy.  He has been using a  Foley catheter to reduce drainage of urine into his rectum.  He is due for an exchange of his stents next week, and I believe he is also scheduled to have surgery to hopefully deal with his colovesical fistula.  The patient did receive pelvic radiation together with infusion 5-FU from 01/07/2010 through 02/15/2009.  Clearly the radiation may complicate the  surgery.  The patient is really without any complaints, although he has been losing weight.  He denies any lack of energy or appetite.  He says he is eating well, taking in 1 can of Boost a day.  The only pain he appears to be having is from the stents.  He is not really on any pain medicines.  He does have a Foley catheter indwelling and wears a urine bag on his leg.  He denies any fever or night sweats.  There has been no obvious blood in his ileostomy bag.  PHYSICAL EXAMINATION:  The patient looks thin.  His weight today is 140.5 pounds as compared with 147 pounds 4 months ago and 163 pounds back in October 2012.  The patient's baseline weight prior to illness in 2010 was about 180 pounds.  Height 5 feet 9 inches, body surface area 1.76 sq. m.  Blood pressure 133/75.  Other vital signs are normal.  He is afebrile.  There is no scleral icterus.  Mouth and pharynx are benign.  No peripheral adenopathy palpable.  Heart and lungs:  Normal. The Port-A-Cath has been removed as part of his surgery in mid November 2012.  Abdomen seems to be benign with no organomegaly or masses palpable.  The patient has an ileostomy bag in the right mid abdomen. He has an indwelling Foley and a urine bag on his left leg. Extremities:  No peripheral edema or clubbing.  LABORATORY DATA:  Today, white count 5.9, ANC 4.7, hemoglobin 9.9, hematocrit 29.9, platelets 297,000.  On 02/07/2011, hemoglobin was 10.6, hematocrit 32.2; and on 11/29/2010 hemoglobin was 13.9, hematocrit 40.4. Chemistries from yesterday reveal a BUN of 40, creatinine 1.66. Chemistries from 02/07/2011 revealed a BUN of 28, creatinine 1.04, albumin 3.5, liver function tests were normal.  CEA was less than 0.5. Pending today are another CEA, chemistries, iron, TIBC, ferritin and vitamin B12 level in view of the patient's anemia, which seems to be asymptomatic at this time.  IMAGING STUDIES: 1. PET scan from 11/29/2010 showed no evidence  for recurrent or     metastatic cancer. 2. CT scan of abdomen and pelvis without IV contrast on 01/16/2011     showed right perinephric fluid collections.  One of these was     opacified, the other was not.  It was felt that these collections     could be urinomas or abscesses.  The patient was noted to have a     right percutaneous nephrostomy tube with bilateral internal     ureteral stents.  Gas was noted within the collecting system in the     right kidney, suggesting infection. 3. CT-guided drain placement into 2 separate right perinephric fluid     collections carried out on 01/16/2011.  Fluid collections     surrounded the right kidney.  These fluid collections was     successfully decompressed.  The findings were suggestive of     urinomas.  Fluid in the collections did not appear to be frankly     infected.  Samples were sent for Gram stain and culture. 4. Cystogram carried out on 04/30/2011 showed  patent colovesical     fistula arising along the right posterolateral aspect of the     bladder.  IMPRESSION AND PLAN:  Clinically Mr. Maddy seems to be doing okay, although I am concerned about progressive weight loss representing another 7 pounds over the last 4 months.  In addition, the patient has become a little bit more anemic than he was 4 months ago, with a hemoglobin today of 9.9 as compared with 10.6 on 02/07/2011.  The patient does not feel badly.  He is scheduled for additional surgery next week, I believe May 16th, in an attempt to correct the fistula. The patient is also going to undergo exchange of his ureteral stents next week by Dr. [name].  I felt at this point given the patient's weight loss, development of anemia which could be due to renal insufficiency, and the fact that his tumor initially was stage IVA dating back 2 years ago, that we ought to proceed with a PET scan to rule out the possibility of recurrent cancer. As noted above, the patient had a CT  scan of the abdomen and pelvis without IV contrast on 01/16/2011 and a PET scan on 11/29/2010.  I have asked Mr. Greenhaw to return in 2 months, at which time we will check CBC, chemistries and CEA.    ______________________________ Samul Dada, M.D. DSM/MEDQ  D:  06/10/2011  T:  06/10/2011  Job:  [id number]

## 2011-06-10 NOTE — Progress Notes (Signed)
This office note has been dictated.  #[id number]

## 2011-06-11 LAB — FERRITIN: Ferritin: 111 ng/mL (ref 22–322)

## 2011-06-11 LAB — IRON AND TIBC: Iron: 22 ug/dL — ABNORMAL LOW (ref 42–165)

## 2011-06-11 NOTE — Pre-Procedure Instructions (Signed)
Faxed note for dr dahlstedt to check 06-10-11 cbc with diff and cmet results in epic, fax confirmation received and placed on chart and left message for selita brasher for dr dahlstedt.

## 2011-06-12 ENCOUNTER — Telehealth (INDEPENDENT_AMBULATORY_CARE_PROVIDER_SITE_OTHER): Payer: Self-pay

## 2011-06-12 NOTE — Pre-Procedure Instructions (Signed)
chasity little called and stated dr [name] looked at abnormal labs and spoke with dr gross. Everything is ok.

## 2011-06-12 NOTE — Telephone Encounter (Signed)
LMOM for Dr Dahlstedt's nurse to call me so I can see about pt getting scheduled for Renal U/S.

## 2011-06-12 NOTE — Telephone Encounter (Signed)
The nurse called me back to let me know that Dr Sharen Hint was out of the office today so she would check on getting the renal u/s ordered and call me back tomorrow.

## 2011-06-13 ENCOUNTER — Telehealth (INDEPENDENT_AMBULATORY_CARE_PROVIDER_SITE_OTHER): Payer: Self-pay

## 2011-06-13 NOTE — Telephone Encounter (Signed)
Eric Munoz returned your call about this pt.  Call her at [phone] ext [phone]

## 2011-06-16 ENCOUNTER — Encounter (HOSPITAL_COMMUNITY)
Admission: RE | Admit: 2011-06-16 | Discharge: 2011-06-16 | Disposition: A | Discharge status: Home / Self Care | Payer: BC Managed Care – PPO | Source: Ambulatory Visit | Attending: Oncology | Admitting: Oncology

## 2011-06-16 DIAGNOSIS — Z85038 Personal history of other malignant neoplasm of large intestine: Secondary | ICD-10-CM

## 2011-06-16 DIAGNOSIS — N1330 Unspecified hydronephrosis: Secondary | ICD-10-CM | POA: Insufficient documentation

## 2011-06-16 DIAGNOSIS — Z432 Encounter for attention to ileostomy: Secondary | ICD-10-CM | POA: Insufficient documentation

## 2011-06-16 DIAGNOSIS — C189 Malignant neoplasm of colon, unspecified: Secondary | ICD-10-CM | POA: Insufficient documentation

## 2011-06-16 LAB — GLUCOSE, CAPILLARY: Glucose-Capillary: 106 mg/dL — ABNORMAL HIGH (ref 70–99)

## 2011-06-16 MED ORDER — FLUDEOXYGLUCOSE F - 18 (FDG) INJECTION
15.2000 | Freq: Once | INTRAVENOUS | Status: AC | PRN
  Administered 2011-06-16: 15.2 via INTRAVENOUS

## 2011-06-17 ENCOUNTER — Telehealth: Payer: Self-pay | Admitting: Medical Oncology

## 2011-06-17 NOTE — Telephone Encounter (Signed)
I called pt per Dr. Arline Asp to inform him his PET was negative. Pt voiced understanding.

## 2011-06-18 NOTE — Anesthesia Preprocedure Evaluation (Addendum)
Anesthesia Evaluation  Patient identified by MRN, date of birth, ID band Patient awake  General Assessment Comment:Pt denies hx of anesthesia problems  Reviewed: Allergy & Precautions, H&P , NPO status , Patient's Chart, lab work & pertinent test results, reviewed documented beta blocker date and time   History of Anesthesia Complications (+) DIFFICULT AIRWAY  Airway Mallampati: II TM Distance: <3 FB Neck ROM: Full  Mouth opening: Limited Mouth Opening  Dental  (+) Teeth Intact and Dental Advisory Given   Pulmonary neg pulmonary ROS, former smoker (10 pack years) breath sounds clear to auscultation        Cardiovascular negative cardio ROS  Rhythm:Regular Rate:Normal  Denies cardiac symptoms   Neuro/Psych negative neurological ROS  negative psych ROS   GI/Hepatic negative GI ROS, Neg liver ROS, Colovesical fistula; history of recurrent colon cancer    Endo/Other  Anemia  Renal/GU ARFRenal diseaseBilateral hydronephrosis, Cr 2.04  negative genitourinary   Musculoskeletal negative musculoskeletal ROS (+)   Abdominal (+) + scaphoid   Peds  Hematology negative hematology ROS (+)   Anesthesia Other Findings Mark overbite with limited mouth opening. Two finger mentum to hyoid distance.  Reproductive/Obstetrics negative OB ROS                          Anesthesia Physical Anesthesia Plan  ASA: III  Anesthesia Plan: General   Post-op Pain Management:    Induction: Intravenous  Airway Management Planned: Oral ETT  Additional Equipment:   Intra-op Plan:   Post-operative Plan: Extubation in OR  Informed Consent: I have reviewed the patients History and Physical, chart, labs and discussed the procedure including the risks, benefits and alternatives for the proposed anesthesia with the patient or authorized representative who has indicated his/her understanding and acceptance.   Dental  advisory given  Plan Discussed with: CRNA  Anesthesia Plan Comments:         Anesthesia Quick Evaluation

## 2011-06-19 ENCOUNTER — Inpatient Hospital Stay (HOSPITAL_COMMUNITY)
Admission: RE | Admit: 2011-06-19 | Discharge: 2011-06-20 | DRG: 331 | Disposition: A | Discharge status: Home / Self Care | Payer: BC Managed Care – PPO | Source: Ambulatory Visit | Attending: Surgery | Admitting: Surgery

## 2011-06-19 ENCOUNTER — Encounter (HOSPITAL_COMMUNITY): Payer: Self-pay | Admitting: *Deleted

## 2011-06-19 ENCOUNTER — Ambulatory Visit (HOSPITAL_COMMUNITY): Payer: BC Managed Care – PPO | Admitting: Anesthesiology

## 2011-06-19 ENCOUNTER — Ambulatory Visit (HOSPITAL_COMMUNITY): Payer: BC Managed Care – PPO

## 2011-06-19 ENCOUNTER — Encounter (HOSPITAL_COMMUNITY): Payer: Self-pay | Admitting: Anesthesiology

## 2011-06-19 ENCOUNTER — Encounter (HOSPITAL_COMMUNITY)
Admission: RE | Disposition: A | Discharge status: Home / Self Care | Payer: Self-pay | Source: Ambulatory Visit | Attending: Surgery

## 2011-06-19 DIAGNOSIS — I10 Essential (primary) hypertension: Secondary | ICD-10-CM | POA: Diagnosis present

## 2011-06-19 DIAGNOSIS — N1330 Unspecified hydronephrosis: Secondary | ICD-10-CM | POA: Diagnosis present

## 2011-06-19 DIAGNOSIS — N289 Disorder of kidney and ureter, unspecified: Secondary | ICD-10-CM | POA: Diagnosis present

## 2011-06-19 DIAGNOSIS — N321 Vesicointestinal fistula: Secondary | ICD-10-CM

## 2011-06-19 DIAGNOSIS — D539 Nutritional anemia, unspecified: Secondary | ICD-10-CM | POA: Diagnosis present

## 2011-06-19 DIAGNOSIS — Z85038 Personal history of other malignant neoplasm of large intestine: Secondary | ICD-10-CM

## 2011-06-19 DIAGNOSIS — Z5309 Procedure and treatment not carried out because of other contraindication: Secondary | ICD-10-CM

## 2011-06-19 DIAGNOSIS — Z86010 Personal history of colonic polyps: Secondary | ICD-10-CM | POA: Diagnosis present

## 2011-06-19 DIAGNOSIS — Z932 Ileostomy status: Secondary | ICD-10-CM

## 2011-06-19 DIAGNOSIS — Z9221 Personal history of antineoplastic chemotherapy: Secondary | ICD-10-CM

## 2011-06-19 DIAGNOSIS — Z923 Personal history of irradiation: Secondary | ICD-10-CM

## 2011-06-19 DIAGNOSIS — Z9049 Acquired absence of other specified parts of digestive tract: Secondary | ICD-10-CM

## 2011-06-19 DIAGNOSIS — N135 Crossing vessel and stricture of ureter without hydronephrosis: Secondary | ICD-10-CM | POA: Diagnosis present

## 2011-06-19 DIAGNOSIS — N360 Urethral fistula: Secondary | ICD-10-CM | POA: Diagnosis present

## 2011-06-19 HISTORY — PX: EXAMINATION UNDER ANESTHESIA: SHX1540

## 2011-06-19 HISTORY — PX: CYSTOSCOPY W/ URETERAL STENT PLACEMENT: SHX1429

## 2011-06-19 LAB — TYPE AND SCREEN: ABO/RH(D): A POS

## 2011-06-19 SURGERY — EXAM UNDER ANESTHESIA
Anesthesia: General | Site: Rectum | Laterality: Bilateral | Wound class: Clean Contaminated

## 2011-06-19 MED ORDER — CEFAZOLIN SODIUM-DEXTROSE 2-3 GM-% IV SOLR
INTRAVENOUS | Status: AC
  Filled 2011-06-19: qty 50

## 2011-06-19 MED ORDER — LIP MEDEX EX OINT
1.0000 "application " | TOPICAL_OINTMENT | Freq: Two times a day (BID) | CUTANEOUS | Status: DC
  Administered 2011-06-19 – 2011-06-20 (×3): 1 via TOPICAL
  Filled 2011-06-19: qty 7

## 2011-06-19 MED ORDER — NEOSTIGMINE METHYLSULFATE 1 MG/ML IJ SOLN
INTRAMUSCULAR | Status: DC | PRN
  Administered 2011-06-19: 3 mg via INTRAVENOUS

## 2011-06-19 MED ORDER — METHYLENE BLUE 1 % INJ SOLN
INTRAMUSCULAR | Status: AC
  Filled 2011-06-19: qty 10

## 2011-06-19 MED ORDER — DIPHENHYDRAMINE HCL 50 MG/ML IJ SOLN: 12.5000 mg | Freq: Four times a day (QID) | INTRAMUSCULAR | Status: DC | PRN

## 2011-06-19 MED ORDER — LIDOCAINE HCL (CARDIAC) 20 MG/ML IV SOLN
INTRAVENOUS | Status: DC | PRN
  Administered 2011-06-19: 10 mg via INTRAVENOUS

## 2011-06-19 MED ORDER — PROPOFOL 10 MG/ML IV EMUL
INTRAVENOUS | Status: DC | PRN
  Administered 2011-06-19: 160 mg via INTRAVENOUS

## 2011-06-19 MED ORDER — LACTATED RINGERS IV BOLUS (SEPSIS): 1000.0000 mL | Freq: Three times a day (TID) | INTRAVENOUS | Status: DC | PRN

## 2011-06-19 MED ORDER — METRONIDAZOLE IN NACL 5-0.79 MG/ML-% IV SOLN
INTRAVENOUS | Status: AC
  Filled 2011-06-19: qty 100

## 2011-06-19 MED ORDER — METRONIDAZOLE IN NACL 5-0.79 MG/ML-% IV SOLN: INTRAVENOUS | Status: DC | PRN

## 2011-06-19 MED ORDER — SUCCINYLCHOLINE CHLORIDE 20 MG/ML IJ SOLN
INTRAMUSCULAR | Status: DC | PRN
  Administered 2011-06-19: 150 mg via INTRAVENOUS

## 2011-06-19 MED ORDER — LOPERAMIDE HCL 2 MG PO TABS: 2.0000 mg | ORAL_TABLET | Freq: Three times a day (TID) | ORAL | Status: DC

## 2011-06-19 MED ORDER — LACTATED RINGERS IV SOLN: INTRAVENOUS | Status: DC

## 2011-06-19 MED ORDER — ACETAMINOPHEN 325 MG PO TABS: 650.0000 mg | ORAL_TABLET | Freq: Four times a day (QID) | ORAL | Status: DC | PRN

## 2011-06-19 MED ORDER — GLYCOPYRROLATE 0.2 MG/ML IJ SOLN
INTRAMUSCULAR | Status: DC | PRN
  Administered 2011-06-19: .4 mg via INTRAVENOUS

## 2011-06-19 MED ORDER — MIDAZOLAM HCL 5 MG/5ML IJ SOLN
INTRAMUSCULAR | Status: DC | PRN
  Administered 2011-06-19: 1 mg via INTRAVENOUS

## 2011-06-19 MED ORDER — FENTANYL CITRATE 0.05 MG/ML IJ SOLN: 25.0000 ug | INTRAMUSCULAR | Status: DC | PRN

## 2011-06-19 MED ORDER — MAGIC MOUTHWASH
15.0000 mL | Freq: Four times a day (QID) | ORAL | Status: DC | PRN
  Filled 2011-06-19: qty 15

## 2011-06-19 MED ORDER — LACTATED RINGERS IV SOLN
INTRAVENOUS | Status: DC | PRN
  Administered 2011-06-19: 07:00:00 via INTRAVENOUS

## 2011-06-19 MED ORDER — PHENYLEPHRINE HCL 10 MG/ML IJ SOLN
INTRAMUSCULAR | Status: DC | PRN
  Administered 2011-06-19: 40 ug via INTRAVENOUS
  Administered 2011-06-19: 20 ug via INTRAVENOUS

## 2011-06-19 MED ORDER — PROMETHAZINE HCL 25 MG/ML IJ SOLN: 6.2500 mg | INTRAMUSCULAR | Status: DC | PRN

## 2011-06-19 MED ORDER — CISATRACURIUM BESYLATE (PF) 10 MG/5ML IV SOLN
INTRAVENOUS | Status: DC | PRN
  Administered 2011-06-19: 3 mg via INTRAVENOUS
  Administered 2011-06-19: 7 mg via INTRAVENOUS

## 2011-06-19 MED ORDER — SODIUM CHLORIDE 0.9 % IR SOLN
Status: DC | PRN
  Administered 2011-06-19: 09:00:00

## 2011-06-19 MED ORDER — ONDANSETRON HCL 4 MG/2ML IJ SOLN
INTRAMUSCULAR | Status: DC | PRN
  Administered 2011-06-19 (×2): 2 mg via INTRAVENOUS

## 2011-06-19 MED ORDER — IOHEXOL 300 MG/ML  SOLN
INTRAMUSCULAR | Status: AC
  Filled 2011-06-19: qty 1

## 2011-06-19 MED ORDER — ALVIMOPAN 12 MG PO CAPS
ORAL_CAPSULE | ORAL | Status: AC
  Administered 2011-06-19: 12 mg via ORAL
  Filled 2011-06-19: qty 1

## 2011-06-19 MED ORDER — HYDROMORPHONE HCL PF 1 MG/ML IJ SOLN: 0.5000 mg | INTRAMUSCULAR | Status: DC | PRN

## 2011-06-19 MED ORDER — BISMUTH SUBSALICYLATE 262 MG/15ML PO SUSP
30.0000 mL | Freq: Three times a day (TID) | ORAL | Status: DC | PRN
  Filled 2011-06-19: qty 236

## 2011-06-19 MED ORDER — LOPERAMIDE HCL 2 MG PO CAPS
2.0000 mg | ORAL_CAPSULE | Freq: Three times a day (TID) | ORAL | Status: DC
  Administered 2011-06-19 – 2011-06-20 (×3): 2 mg via ORAL
  Filled 2011-06-19 (×4): qty 1

## 2011-06-19 MED ORDER — OXYCODONE HCL 5 MG PO TABS: 5.0000 mg | ORAL_TABLET | ORAL | Status: DC | PRN

## 2011-06-19 MED ORDER — HETASTARCH-ELECTROLYTES 6 % IV SOLN
INTRAVENOUS | Status: DC | PRN
  Administered 2011-06-19: 09:00:00 via INTRAVENOUS

## 2011-06-19 MED ORDER — STERILE WATER FOR IRRIGATION IR SOLN
Status: DC | PRN
  Administered 2011-06-19: 4000 mL

## 2011-06-19 MED ORDER — BOOST HIGH PROTEIN PO LIQD
1.0000 | Freq: Two times a day (BID) | ORAL | Status: DC
  Filled 2011-06-19: qty 1

## 2011-06-19 MED ORDER — LACTATED RINGERS IV SOLN
INTRAVENOUS | Status: DC
  Administered 2011-06-19: 22:00:00 via INTRAVENOUS
  Administered 2011-06-19: 1000 mL via INTRAVENOUS
  Administered 2011-06-20: 12:00:00 via INTRAVENOUS

## 2011-06-19 MED ORDER — HYDROMORPHONE HCL PF 1 MG/ML IJ SOLN
INTRAMUSCULAR | Status: DC | PRN
  Administered 2011-06-19: 0.5 mg via INTRAVENOUS

## 2011-06-19 MED ORDER — BUPIVACAINE-EPINEPHRINE 0.5% -1:200000 IJ SOLN
INTRAMUSCULAR | Status: AC
  Filled 2011-06-19: qty 1

## 2011-06-19 MED ORDER — LOPERAMIDE HCL 2 MG PO CAPS
2.0000 mg | ORAL_CAPSULE | Freq: Three times a day (TID) | ORAL | Status: DC | PRN
  Filled 2011-06-19: qty 1

## 2011-06-19 MED ORDER — HEPARIN SODIUM (PORCINE) 5000 UNIT/ML IJ SOLN
5000.0000 [IU] | Freq: Three times a day (TID) | INTRAMUSCULAR | Status: DC
  Administered 2011-06-19 – 2011-06-20 (×4): 5000 [IU] via SUBCUTANEOUS
  Filled 2011-06-19 (×6): qty 1

## 2011-06-19 MED ORDER — CEFAZOLIN SODIUM-DEXTROSE 2-3 GM-% IV SOLR
2.0000 g | INTRAVENOUS | Status: AC
Start: 2011-06-19 — End: 2011-06-19
  Administered 2011-06-19: 2 g via INTRAVENOUS

## 2011-06-19 MED ORDER — FENTANYL CITRATE 0.05 MG/ML IJ SOLN
INTRAMUSCULAR | Status: DC | PRN
  Administered 2011-06-19 (×4): 25 ug via INTRAVENOUS
  Administered 2011-06-19: 75 ug via INTRAVENOUS
  Administered 2011-06-19: 50 ug via INTRAVENOUS
  Administered 2011-06-19: 25 ug via INTRAVENOUS

## 2011-06-19 MED ORDER — ALVIMOPAN 12 MG PO CAPS
12.0000 mg | ORAL_CAPSULE | Freq: Once | ORAL | Status: AC
  Administered 2011-06-19: 12 mg via ORAL

## 2011-06-19 MED ORDER — SPOT INK MARKER SYRINGE KIT
PACK | SUBMUCOSAL | Status: AC
  Filled 2011-06-19: qty 5

## 2011-06-19 MED ORDER — BOOST PLUS PO LIQD
237.0000 mL | Freq: Two times a day (BID) | ORAL | Status: DC
  Administered 2011-06-19 – 2011-06-20 (×3): 237 mL via ORAL
  Filled 2011-06-19 (×3): qty 237

## 2011-06-19 MED ORDER — SODIUM CHLORIDE 0.9 % IV SOLN
INTRAVENOUS | Status: DC | PRN
  Administered 2011-06-19: 08:00:00 via INTRAVENOUS

## 2011-06-19 SURGICAL SUPPLY — 103 items
ADAPTER CATH URET PLST 4-6FR (CATHETERS) ×4 IMPLANT
ADPR CATH URET STRL DISP 4-6FR (CATHETERS) ×3
APPLIER CLIP 5 13 M/L LIGAMAX5 (MISCELLANEOUS) ×4
APPLIER CLIP ROT 10 11.4 M/L (STAPLE) ×4
APR CLP MED LRG 11.4X10 (STAPLE) ×3
APR CLP MED LRG 5 ANG JAW (MISCELLANEOUS) ×3
BAG URINE DRAINAGE (UROLOGICAL SUPPLIES) IMPLANT
BAG URO CATCHER STRL LF (DRAPE) ×4 IMPLANT
BLADE EXTENDED COATED 6.5IN (ELECTRODE) ×4 IMPLANT
BLADE HEX COATED 2.75 (ELECTRODE) ×4 IMPLANT
BLADE SURG 15 STRL LF DISP TIS (BLADE) ×6 IMPLANT
BLADE SURG 15 STRL SS (BLADE) ×8
BLADE SURG SZ10 CARB STEEL (BLADE) ×4 IMPLANT
CABLE HIGH FREQUENCY MONO STRZ (ELECTRODE) ×4 IMPLANT
CANISTER SUCTION 2500CC (MISCELLANEOUS) ×4 IMPLANT
CATH FOLEY SILVER 30CC 28FR (CATHETERS) IMPLANT
CATH URET WHISTLE 6FR (CATHETERS) IMPLANT
CELLS DAT CNTRL 66122 CELL SVR (MISCELLANEOUS) IMPLANT
CLIP APPLIE 5 13 M/L LIGAMAX5 (MISCELLANEOUS) ×3 IMPLANT
CLIP APPLIE ROT 10 11.4 M/L (STAPLE) ×3 IMPLANT
CLOTH BEACON ORANGE TIMEOUT ST (SAFETY) ×8 IMPLANT
COVER MAYO STAND STRL (DRAPES) ×4 IMPLANT
DECANTER SPIKE VIAL GLASS SM (MISCELLANEOUS) ×4 IMPLANT
DRAIN CHANNEL 19F RND (DRAIN) ×4 IMPLANT
DRAPE CAMERA CLOSED 9X96 (DRAPES) ×4 IMPLANT
DRAPE LAPAROSCOPIC ABDOMINAL (DRAPES) ×4 IMPLANT
DRAPE LAPAROTOMY T 102X78X121 (DRAPES) IMPLANT
DRAPE LG THREE QUARTER DISP (DRAPES) ×4 IMPLANT
DRAPE WARM FLUID 44X44 (DRAPE) ×8 IMPLANT
DRSG PAD ABDOMINAL 8X10 ST (GAUZE/BANDAGES/DRESSINGS) IMPLANT
ELECT REM PT RETURN 9FT ADLT (ELECTROSURGICAL) ×4
ELECTRODE REM PT RTRN 9FT ADLT (ELECTROSURGICAL) ×3 IMPLANT
FILTER SMOKE EVAC LAPAROSHD (FILTER) IMPLANT
GAUZE SPONGE 4X4 16PLY XRAY LF (GAUZE/BANDAGES/DRESSINGS) ×4 IMPLANT
GLIDEWIRE 0.025 SS STRAIGHT (WIRE) ×2 IMPLANT
GLOVE BIOGEL M 8.0 STRL (GLOVE) ×4 IMPLANT
GLOVE BIOGEL PI IND STRL 7.0 (GLOVE) ×3 IMPLANT
GLOVE BIOGEL PI INDICATOR 7.0 (GLOVE) ×1
GLOVE ECLIPSE 8.0 STRL XLNG CF (GLOVE) ×8 IMPLANT
GLOVE INDICATOR 8.0 STRL GRN (GLOVE) ×4 IMPLANT
GOWN PREVENTION PLUS XLARGE (GOWN DISPOSABLE) ×4 IMPLANT
GOWN STRL NON-REIN LRG LVL3 (GOWN DISPOSABLE) ×4 IMPLANT
GOWN STRL REIN XL XLG (GOWN DISPOSABLE) ×12 IMPLANT
GRASPER LAPSCPC 5X35 EPIX (ENDOMECHANICALS) IMPLANT
HAND ACTIVATED (MISCELLANEOUS) IMPLANT
KIT BASIN OR (CUSTOM PROCEDURE TRAY) ×4 IMPLANT
LEGGING LITHOTOMY PAIR STRL (DRAPES) ×2 IMPLANT
LIGASURE IMPACT 36 18CM CVD LR (INSTRUMENTS) IMPLANT
MANIFOLD NEPTUNE II (INSTRUMENTS) ×4 IMPLANT
NDL SAFETY ECLIPSE 18X1.5 (NEEDLE) IMPLANT
NEEDLE HYPO 18GX1.5 SHARP (NEEDLE)
NEEDLE HYPO 22GX1.5 SAFETY (NEEDLE) ×4 IMPLANT
NS IRRIG 1000ML POUR BTL (IV SOLUTION) ×4 IMPLANT
PACK BASIC VI WITH GOWN DISP (CUSTOM PROCEDURE TRAY) ×4 IMPLANT
PACK CYSTO (CUSTOM PROCEDURE TRAY) ×4 IMPLANT
PENCIL BUTTON HOLSTER BLD 10FT (ELECTRODE) ×4 IMPLANT
PUMP PAIN ON-Q (MISCELLANEOUS) ×4 IMPLANT
RETRACTOR WND ALEXIS 18 MED (MISCELLANEOUS) IMPLANT
RTRCTR WOUND ALEXIS 18CM MED (MISCELLANEOUS)
SCISSORS LAP 5X35 DISP (ENDOMECHANICALS) ×2 IMPLANT
SET IRRIG TUBING LAPAROSCOPIC (IRRIGATION / IRRIGATOR) ×4 IMPLANT
SLEEVE Z-THREAD 5X100MM (TROCAR) ×2 IMPLANT
SOLUTION ANTI FOG 6CC (MISCELLANEOUS) ×4 IMPLANT
SPONGE GAUZE 4X4 12PLY (GAUZE/BANDAGES/DRESSINGS) ×4 IMPLANT
SPONGE LAP 18X18 X RAY DECT (DISPOSABLE) ×8 IMPLANT
SPONGE SURGIFOAM ABS GEL 12-7 (HEMOSTASIS) IMPLANT
STAPLER VISISTAT 35W (STAPLE) ×4 IMPLANT
STENT CONTOUR 7FRX24X.038 (STENTS) ×4 IMPLANT
STENT URET PIGTAIL 6FR G14072 (Stent) ×2 IMPLANT
SUCTION POOLE TIP (SUCTIONS) ×4 IMPLANT
SUT CHROMIC 2 0 SH (SUTURE) IMPLANT
SUT CHROMIC 3 0 SH 27 (SUTURE) IMPLANT
SUT PDS AB 1 CTX 36 (SUTURE) ×8 IMPLANT
SUT PDS AB 1 TP1 96 (SUTURE) IMPLANT
SUT PROLENE 2 0 KS (SUTURE) IMPLANT
SUT SILK 2 0 (SUTURE) ×4
SUT SILK 2 0 SH CR/8 (SUTURE) ×4 IMPLANT
SUT SILK 2-0 18XBRD TIE 12 (SUTURE) ×3 IMPLANT
SUT SILK 3 0 (SUTURE) ×4
SUT SILK 3 0 SH CR/8 (SUTURE) ×4 IMPLANT
SUT SILK 3-0 18XBRD TIE 12 (SUTURE) ×3 IMPLANT
SUT VIC AB 2-0 SH 18 (SUTURE) ×8 IMPLANT
SUT VIC AB 2-0 SH 27 (SUTURE)
SUT VIC AB 2-0 SH 27X BRD (SUTURE) IMPLANT
SUT VIC AB 4-0 SH 18 (SUTURE) IMPLANT
SUT VICRYL 2 0 18  UND BR (SUTURE) ×2
SUT VICRYL 2 0 18 UND BR (SUTURE) ×6 IMPLANT
SYR 30ML LL (SYRINGE) IMPLANT
SYRINGE IRR TOOMEY STRL 70CC (SYRINGE) IMPLANT
SYS LAPSCP GELPORT 120MM (MISCELLANEOUS)
SYSTEM LAPSCP GELPORT 120MM (MISCELLANEOUS) IMPLANT
TOWEL OR 17X26 10 PK STRL BLUE (TOWEL DISPOSABLE) ×12 IMPLANT
TRAY FOLEY CATH 14FRSI W/METER (CATHETERS) ×2 IMPLANT
TRAY LAP CHOLE (CUSTOM PROCEDURE TRAY) ×4 IMPLANT
TROCAR Z-THREAD FIOS 11X100 BL (TROCAR) ×2 IMPLANT
TROCAR Z-THREAD FIOS 5X100MM (TROCAR) ×6 IMPLANT
TROCAR Z-THREAD SLEEVE 11X100 (TROCAR) IMPLANT
TUBING CONNECTING 10 (TUBING) ×2 IMPLANT
TUBING FILTER THERMOFLATOR (ELECTROSURGICAL) ×4 IMPLANT
WATER STERILE IRR 1000ML UROMA (IV SOLUTION) ×4 IMPLANT
WATER STERILE IRR 3000ML UROMA (IV SOLUTION) ×2 IMPLANT
YANKAUER SUCT BULB TIP 10FT TU (MISCELLANEOUS) ×4 IMPLANT
YANKAUER SUCT BULB TIP NO VENT (SUCTIONS) ×4 IMPLANT

## 2011-06-19 NOTE — Anesthesia Postprocedure Evaluation (Signed)
Anesthesia Post Note  Patient: Eric Munoz  Procedure(s) Performed: Procedure(s) (LRB): CYSTOSCOPY WITH RETROGRADE PYELOGRAM/URETERAL STENT PLACEMENT (Bilateral) EXAM UNDER ANESTHESIA ()  Anesthesia type: General  Patient location: PACU  Post pain: Pain level controlled  Post assessment: Post-op Vital signs reviewed  Last Vitals:  Filed Vitals:   06/19/11 1030  BP:   Pulse: 66  Temp:   Resp: 12    Post vital signs: Reviewed  Level of consciousness: sedated  Complications: No apparent anesthesia complications

## 2011-06-19 NOTE — Op Note (Signed)
06/19/2011  9:43 AM  PATIENT:  Eric Munoz  71 y.o. male  Patient Care Team: [name], MD as PCP - General (Family Medicine) [name], MD as Consulting Physician (Urology) [name], MD as Consulting Physician (Radiation Oncology) [name], MD as Consulting Physician (Gastroenterology) Samul Dada, MD as Consulting Physician (Hematology and Oncology)  PRE-OPERATIVE DIAGNOSIS:  colovesical fistula  POST-OPERATIVE DIAGNOSIS:  colovesical fsitula   PROCEDURE:  Procedure(s): CYSTOSCOPY WITH RETROGRADE PYELOGRAM/URETERAL STENT PLACEMENT EXAM UNDER ANESTHESIA  SURGEON:  Surgeon(s): [name], MD [name], MD  ASSISTANT: [name], MD  ANESTHESIA:   general  EBL:  Total I/O In: 2100 [I.V.:2000; IV Piggyback:100] Out: 210 [Urine:40; Other:120; Blood:50]  Delay start of Pharmacological VTE agent (>24hrs) due to surgical blood loss or risk of bleeding:  no  DRAINS: none   SPECIMEN:  No Specimen  DISPOSITION OF SPECIMEN:  N/A  COUNTS:  YES  PLAN OF CARE: Admit for overnight observation  PATIENT DISPOSITION:  PACU - hemodynamically stable.  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 Had post adjuvant chemoradiation therapy. Survivor. Wished to have colostomy takedown. He underwent this. It was challenged by significant fibrosis in the pelvis. He had proximal ileal diversion. 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  Workup showed it at the right trigone. We recommended repair. Various approaches were discussed. Risks benefits alternatives discussed. The patient and wife wish to stay in town. Questions are answered and the patient and wife agree to proceed.  Offer was made to have second opinion at another major center. They wish to stay in town.  OR FINDINGS: The fistula seems to be more in the proximal prostatic urethra. I'm concerned that the coloanal stapled  anastomosis has dehisced and eroded into urethra. It is a large defect >2cm.    We aborted doing any major reconstruction at this time  DESCRIPTION: informed consent was confirmed. Patient received IM Rx. His abdomen and perineum were prepped and draped in a sterile fashion. I placed Ioban to protect the loop ileostomy. Surgical timeout confirmed our plan.  Dr. Hillis Range performed cystoscopy to change the bilateral ureteral stents. Please see his note for details. I did examination under anesthesia.  The perianal region was clear. The sphincters appeared intact. Red stay I encountered the staples. The anastomosis seemed to dehisced anteriorly. I could feel obvious catheter tube consistent with a larger defect at the urethra. There is to stricture from any rigid proctoscopy.  After long discussion with Dr. Olegario Shearer to myself we felt that an abdominal approach was appropriate. The patient Synetta Fail perineal approach. I felt strongly he would need a muscle flap buttress repair of a larger urethral/bladder neck defect. I also feel the coloanal anastomosis is nonsalvageable portable.  Therefore we've been reported procedure. We discussed the findings patient's wife. I suspect he will need more definitive reconstruction. I do not think omentum is a reasonable to go transabdominally to go through the prior region scarred bladder and pelvis. Should be able to do transperineal.   Questions answered. She expressed understanding. We will watch the patient overnight Dr. Chauncey Reading wishes. Plan more definitive studies and workup. I will work to see if muscle flap buttress of the reconstruction is available with a Biomedical scientist in town.

## 2011-06-19 NOTE — Interval H&P Note (Signed)
History and Physical Interval Note:  06/19/2011 7:07 AM  [name]  has presented today for surgery, with the diagnosis of colovesical fistula  The various methods of treatment have been discussed with the patient and family. After consideration of risks, benefits and other options for treatment, the patient has consented to  Procedure(s) (LRB): Repair of colovesical fistula PROCTOSCOPY (N/A) CYSTOSCOPY WITH RETROGRADE PYELOGRAM/URETERAL STENT PLACEMENT (Bilateral) as a surgical intervention .  The patients' history has been reviewed, patient examined, no change in status, stable for surgery.  I have reviewed the patients' chart and labs.  Questions were answered to the patient's satisfaction.     Eric Munoz C.

## 2011-06-19 NOTE — H&P (View-Only) (Signed)
Subjective:     Patient ID: Eric Munoz, male   DOB: 06/29/1940, 70 y.o.   MRN: [id number]  HPI   Eric Munoz  03/01/1940 [id number]  Patient Care Team: Charles Alan Ross, MD as PCP - General (Family Medicine) Stephen Dahlstedt, MD as Consulting Physician (Urology) John Scott Moody, MD as Consulting Physician (Radiation Oncology) Salem F Ganem, MD as Consulting Physician (Gastroenterology) Donald S Murinson, MD as Consulting Physician (Hematology and Oncology)  This patient is a 70 y.o.male who presents today for surgical evaluation.   Diagnosis: Metastatic colon cancer status post resection and colostomy  Procedure: Colostomy takedown with protective loop ileostomy for coloanal anastomosis 12/20/2010.  Patient comes in noting that the bladder leak has recurred. Dr. Dahlsteadt saw the patient. He feels that a surgical repair is required. He sent the patient back to me to see the what options are available.  No clear liquid drainage out the anus with the Foley back in.  The patient's energy level is good. He denies any fevers or chills.   The patient is eating fine.   Ileostomy output stable. Exercising OK. Urine output seems fine.  Past Medical History  Diagnosis Date  . Easy bruising   . 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 Difficult intubation   . Colon cancer 2007, recurrent Jul2011    Recurrent, Stage 4, [id number] (Left colectomy/LAR , ChemoTx completed Feb2012/ XRT completed Jan2012  . S/P chemotherapy, time since greater than 12 weeks 03-2010 last chemo  . S/P radiation > 12 weeks 02-2010    last radiation  . Hypertension   . Urinoma, s/p bladder repair 01/20/2011  . Acute renal failure (ARF), resolving 12/23/2010    Past Surgical History  Procedure Date  . Portacath placement 12-11-10    09-17-09 right chest/ states will be removed 12-20-10  . Colostomy takedown 12/20/2010    Procedure: LAPAROSCOPIC  COLOSTOMY TAKEDOWN;  Surgeon: Baley Lorimer C. Katyra Tomassetti, MD;  Location: [facility];  Service: General;  Laterality: N/A;  Laparoscopy Colostomy Takedownn With Ileostomy  . Port-a-cath removal 12/20/2010    Procedure: REMOVAL PORT-A-CATH;  Surgeon: Debie Ashline C. Shigeko Manard, MD;  Location: [facility];  Service: General;  Laterality: Right;  Right  venous catheter no longer needed.  . Cystoscopy w/ ureteral stent placement 12/20/2010    Procedure: CYSTOSCOPY WITH STENT REPLACEMENT;  Surgeon: Stephen Dahlstedt;  Location: [facility];  Service: Urology;  Laterality: Left;  Cystoscopy/Left Double J Stent Exchange  . Fracture surgery   . Tonsillectomy yrs ago  . Colon surgery 08/07/09    Low Anterior Rescection, resection of L ext iliac artery,  ureterolysis  . Cystoscopy w/ ureteral stent placement 01/13/2011    Procedure: CYSTOSCOPY WITH RETROGRADE PYELOGRAM/URETERAL STENT PLACEMENT;  Surgeon: Stephen Dahlstedt;  Location: [facility];  Service: Urology;  Laterality: Left;  intra-operative cystogram    History   Social History  . Marital Status: Married    Spouse Name: N/A    Number of Children: N/A  . Years of Education: N/A   Occupational History  . Not on file.   Social History Main Topics  . Smoking status: Former Smoker -- 1.0 packs/day for 10 years    Quit date: 02/03/1974  . Smokeless tobacco: Never Used  . Alcohol Use: No  . Drug Use: No  . Sexually Active: Yes   Other Topics Concern  . Not on file   Social History Narrative  . No narrative   on file    Family History  Problem Relation Age of Onset  . Stroke Father 72  . Cancer Father     colon  . Lupus Daughter     Current outpatient prescriptions:acetaminophen (TYLENOL) 325 MG tablet, Take 650 mg by mouth every 6 (six) hours as needed. Pain  , Disp: , Rfl: ;  diphenoxylate-atropine (LOMOTIL) 2.5-0.025 MG per tablet, Take 1 tablet by mouth as needed.  , Disp: , Rfl: ;  loperamide (IMODIUM A-D) 2 MG tablet, Take 2 mg by mouth 2 (two) times daily.  , Disp:  , Rfl:  nutritional supplement (BOOST HIGH PROTEIN) LIQD, Take 1 Can by mouth daily. Per patient taking supplement that is in plastic bottle. Was not an option in database., Disp: , Rfl: ;  ondansetron (ZOFRAN) 8 MG tablet, Take 8 mg by mouth every 8 (eight) hours as needed.  , Disp: , Rfl: ;  prochlorperazine (COMPAZINE) 10 MG tablet, Take 10 mg by mouth every 6 (six) hours as needed.  , Disp: , Rfl:  psyllium (METAMUCIL) 58.6 % powder, Take 1 packet by mouth 2 (two) times daily.  , Disp: , Rfl: ;  sulfamethoxazole-trimethoprim (SMZ-TMP DS) 800-160 MG per tablet, Take 1 tablet by mouth 2 (two) times daily., Disp: , Rfl:   No Known Allergies  BP 130/70  Pulse 84  Temp(Src) 97.4 F (36.3 C) (Temporal)  Ht 5' 9" (1.753 m)  Wt 139 lb (63.05 kg)  BMI 20.53 kg/m2  SpO2 98%     Review of Systems  Constitutional: Negative for fever, chills and diaphoresis.  HENT: Negative for sore throat, trouble swallowing and neck pain.   Eyes: Negative for photophobia and visual disturbance.  Respiratory: Negative for choking and shortness of breath.   Cardiovascular: Negative for chest pain and palpitations.  Gastrointestinal: Negative for nausea, vomiting, abdominal distention, anal bleeding and rectal pain.  Genitourinary: Negative for dysuria, urgency, difficulty urinating and testicular pain.  Musculoskeletal: Negative for myalgias, arthralgias and gait problem.  Skin: Negative for color change and rash.  Neurological: Negative for dizziness, speech difficulty, weakness and numbness.  Hematological: Negative for adenopathy.  Psychiatric/Behavioral: Negative for hallucinations, confusion and agitation.       Objective:   Physical Exam  Constitutional: He is oriented to person, place, and time. He appears well-developed and well-nourished. No distress.  HENT:  Head: Normocephalic.  Mouth/Throat: Oropharynx is clear and moist. No oropharyngeal exudate.  Eyes: Conjunctivae and EOM are normal.  Pupils are equal, round, and reactive to light. No scleral icterus.  Neck: Normal range of motion. No tracheal deviation present.  Cardiovascular: Normal rate, normal heart sounds and intact distal pulses.   Pulmonary/Chest: Effort normal. No respiratory distress.  Abdominal: Soft. He exhibits no distension. There is no tenderness. Hernia confirmed negative in the right inguinal area and confirmed negative in the left inguinal area.       Incisions clean with normal healing ridges.  No hernias. Loop ileostomy pink with thin succus.    Genitourinary:       Foley in.  Clear urine.    Perianal skin clean with good hygiene.  No pruritis.  No pilonidal disease.  No fissure.  No abscess/fistula.    Tolerates digital rectal exam.  Normal sphincter tone.  Stricture at anastomosis.  2.5cm from anal verge - I dilated w my index finger.  Staples felt at anastomosis.  No obvious fistula felt.   Musculoskeletal: Normal range of motion. He exhibits no tenderness.  Neurological:   He is alert and oriented to person, place, and time. No cranial nerve deficit. He exhibits normal muscle tone. Coordination normal.  Skin: Skin is warm and dry. No rash noted. He is not diaphoretic.  Psychiatric: He has a normal mood and affect. His behavior is normal.       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   Plan:     This is a challenging situation. I believe he needs surgery to separate the colon from the bladder. He will need some tissue to interpose between these structures. Hopefully some omentum left to do this. Another option would be to try and do a peritoneal flap. A more aggressive option would be to mobilize a muscle pedicle flap such as a VRAM, biceps femoris, etc.  He is at higher risk for complications given his radiation leaving a narrow  stricture pelvis.  I did discuss the procedure with him:  The anatomy & physiology of the digestive tract was discussed.  The pathophysiology of  fistula between the bowel and bladder was discussed.  Natural history risks without surgery was discussed. I worked to give an overview of the disease and the frequent need to have multispecialty involvement.   I feel the risks of no intervention will lead to serious problems that outweigh the operative risks; therefore, I recommended surgery to treat the pathology.  Laparoscopic & open techniques for bladder repair were discussed. Perhaps hand-assisted mobilization of the omentum is reasonable, but the repair itself should be an open fashion.  While I normally would resect the colon involved in the fistula and bring down a new anastomosis, his pelvis is so strictured that I don't think that is a great idea. It is just a focal area, hopefully I can just do a colon wall resection and primary closure. I would like to avoid creating another anastomosis. Continued fecal diversion by ileostomy was discussed.  We will work to preserve anal & pelvic floor function without sacrificing cure.  Need for prolonged bladder catheterization was discussed.  If that fails, he may need to be converted to an end colostomy.  Risks such as bleeding, infection, abscess, leak, recurrence with reoperation, possible ostomy, hernia, heart attack, death, and other risks were discussed.  I noted a good likelihood this will help address the problem.   Goals of post-operative recovery were discussed as well.  We will work to minimize complications.  An educational handout on the pathology was given as well.  Questions were answered.    The patient expresses understanding & wishes to proceed with surgery.  I again discussed with him the option of going to a mother facility to get a second opinion. Pedal to see what options are available from their perspective.  Right now he claims he wishes to  stay within our community.  I want to make sure that his family is comfortable with this as well. He wishes to discuss with his wife as well.        

## 2011-06-19 NOTE — Discharge Instructions (Signed)
ANORECTAL SURGERY: POST OP INSTRUCTIONS  1. Take your usually prescribed home medications unless otherwise directed. 2. DIET: Follow a light bland diet the first 24 hours after arrival home, such as soup, liquids, crackers, etc.  Be sure to include lots of fluids daily.  Avoid fast food or heavy meals as your are more likely to get nauseated.  Eat a low fat the next few days after surgery.   3. PAIN CONTROL: a. Pain is best controlled by a usual combination of three different methods TOGETHER: i. Ice/Heat ii. Over the counter pain medication iii. Prescription pain medication b. Most patients will experience some swelling and discomfort in the anus/rectal area. and incisions.  Ice packs or heat (30-60 minutes up to 6 times a day) will help. Use ice for the first few days to help decrease swelling and bruising, then switch to heat such as warm towels, sitz baths, warm baths, etc to help relax tight/sore spots and speed recovery.  Some people prefer to use ice alone, heat alone, alternating between ice & heat.  Experiment to what works for you.  Swelling and bruising can take several weeks to resolve.   c. It is helpful to take an over-the-counter pain medication regularly for the first few weeks.  Choose one of the following that works best for you: i. Naproxen (Aleve, etc)  Two 220mg tabs twice a day ii. Ibuprofen (Advil, etc) Three 200mg tabs four times a day (every meal & bedtime) iii. Acetaminophen (Tylenol, etc) 500-650mg four times a day (every meal & bedtime) d. A  prescription for pain medication (such as oxycodone, hydrocodone, etc) should be given to you upon discharge.  Take your pain medication as prescribed.  i. If you are having problems/concerns with the prescription medicine (does not control pain, nausea, vomiting, rash, itching, etc), please call us [phone] to see if we need to switch you to a different pain medicine that will work better for you and/or control your side effect  better. ii. If you need a refill on your pain medication, please contact your pharmacy.  They will contact our office to request authorization. Prescriptions will not be filled after 5 pm or on week-ends. 4. KEEP YOUR BOWELS REGULAR a. The goal is one bowel movement a day b. Avoid getting constipated.  Between the surgery and the pain medications, it is common to experience some constipation.  Increasing fluid intake and taking a fiber supplement (such as Metamucil, Citrucel, FiberCon, MiraLax, etc) 1-2 times a day regularly will usually help prevent this problem from occurring.  A mild laxative (prune juice, Milk of Magnesia, MiraLax, etc) should be taken according to package directions if there are no bowel movements after 48 hours. c. Watch out for diarrhea.  If you have many loose bowel movements, simplify your diet to bland foods & liquids for a few days.  Stop any stool softeners and decrease your fiber supplement.  Switching to mild anti-diarrheal medications (Kayopectate, Pepto Bismol) can help.  If this worsens or does not improve, please call us.  5. Wound Care a. Remove your bandages the day after surgery.  Unless discharge instructions indicate otherwise, leave your bandage dry and in place overnight.  Remove the bandage during your first bowel movement.   b. Allow the wound packing to fall out over the next few days.  You can trim exposed gauze / ribbon as it falls out.  You do not need to repack the wound unless instructed otherwise.  Wear an   absorbent pad or soft cotton gauze in your underwear as needed to catch any drainage and help keep the area  c. Keep the area clean and dry.  Bathe / shower every day.  Keep the area clean by showering / bathing over the incision / wound.   It is okay to soak an open wound to help wash it.  Wet wipes or showers / gentle washing after bowel movements is often less traumatic than regular toilet paper. d. You may have some styrofoam-like soft packing in  the rectum which will come out with the first bowel movement.  e. You will often notice bleeding with bowel movements.  This should slow down by the end of the first week of surgery f. Expect some drainage.  This should slow down, too, by the end of the first week of surgery.  Wear an absorbent pad or soft cotton gauze in your underwear until the drainage stops. 6. ACTIVITIES as tolerated:   a. You may resume regular (light) daily activities beginning the next day--such as daily self-care, walking, climbing stairs--gradually increasing activities as tolerated.  If you can walk 30 minutes without difficulty, it is safe to try more intense activity such as jogging, treadmill, bicycling, low-impact aerobics, swimming, etc. b. Save the most intensive and strenuous activity for last such as sit-ups, heavy lifting, contact sports, etc  Refrain from any heavy lifting or straining until you are off narcotics for pain control.   c. DO NOT PUSH THROUGH PAIN.  Let pain be your guide: If it hurts to do something, don't do it.  Pain is your body warning you to avoid that activity for another week until the pain goes down. d. You may drive when you are no longer taking prescription pain medication, you can comfortably sit for long periods of time, and you can safely maneuver your car and apply brakes. e. You may have sexual intercourse when it is comfortable.  7. FOLLOW UP in our office a. Please call [facility] at [phone] to set up an appointment to see your surgeon in the office for a follow-up appointment approximately 2 weeks after your surgery. b. Make sure that you call for this appointment the day you arrive home to insure a convenient appointment time. 10. IF YOU HAVE DISABILITY OR FAMILY LEAVE FORMS, BRING THEM TO THE OFFICE FOR PROCESSING.  DO NOT GIVE THEM TO YOUR DOCTOR.        WHEN TO CALL US [phone]: 1. Poor pain control 2. Reactions / problems with new medications (rash/itching, nausea,  etc)  3. Fever over 101.5 F (38.5 C) 4. Inability to urinate 5. Nausea and/or vomiting 6. Worsening swelling or bruising 7. Continued bleeding from incision. 8. Increased pain, redness, or drainage from the incision  The clinic staff is available to answer your questions during regular business hours (8:30am-5pm).  Please don't hesitate to call and ask to speak to one of our nurses for clinical concerns.   A surgeon from [facility] is always on call at the hospitals   If you have a medical emergency, go to the nearest emergency room or call 911.    [facility] [address], [address], [place], [place]  [address] ? MAIN: [phone] ? TOLL FREE: [phone] ? FAX [phone] [email or web address]    

## 2011-06-19 NOTE — Transfer of Care (Signed)
Immediate Anesthesia Transfer of Care Note  Patient: Eric Munoz  Procedure(s) Performed: Procedure(s) (LRB): CYSTOSCOPY WITH RETROGRADE PYELOGRAM/URETERAL STENT PLACEMENT (Bilateral) EXAM UNDER ANESTHESIA ()  Patient Location: PACU  Anesthesia Type: General  Level of Consciousness: awake and sedated  Airway & Oxygen Therapy: Patient Spontanous Breathing  Post-op Assessment: Report given to PACU RN and Post -op Vital signs reviewed and stable  Post vital signs: Reviewed and stable  Complications: No apparent anesthesia complications

## 2011-06-20 ENCOUNTER — Encounter (HOSPITAL_COMMUNITY): Payer: Self-pay | Admitting: Anesthesiology

## 2011-06-20 ENCOUNTER — Ambulatory Visit (HOSPITAL_COMMUNITY): Payer: BC Managed Care – PPO

## 2011-06-20 ENCOUNTER — Encounter (HOSPITAL_COMMUNITY): Payer: Self-pay | Admitting: Surgery

## 2011-06-20 LAB — PREALBUMIN: Prealbumin: 7 mg/dL — ABNORMAL LOW (ref 17.0–34.0)

## 2011-06-20 LAB — BASIC METABOLIC PANEL
Chloride: 109 mEq/L (ref 96–112)
GFR calc Af Amer: 30 mL/min — ABNORMAL LOW (ref >90)
Potassium: 3.5 mEq/L (ref 3.5–5.1)

## 2011-06-20 LAB — HEMOGLOBIN A1C
Hgb A1c MFr Bld: 5.6 % (ref <5.7)
Mean Plasma Glucose: 114 mg/dL (ref <117)

## 2011-06-20 LAB — HEPATIC FUNCTION PANEL: Total Protein: 5.8 g/dL — ABNORMAL LOW (ref 6.0–8.3)

## 2011-06-20 LAB — TSH: TSH: 1.026 u[IU]/mL (ref 0.350–4.500)

## 2011-06-20 MED ORDER — BOOST PLUS PO LIQD
237.0000 mL | Freq: Three times a day (TID) | ORAL | Status: DC
  Filled 2011-06-20: qty 237

## 2011-06-20 MED ORDER — OXYCODONE HCL 5 MG PO TABS: 5.0000 mg | ORAL_TABLET | ORAL | Status: AC | PRN

## 2011-06-20 MED ORDER — LACTATED RINGERS IV BOLUS (SEPSIS)
1000.0000 mL | Freq: Once | INTRAVENOUS | Status: AC
  Administered 2011-06-20: 1000 mL via INTRAVENOUS

## 2011-06-20 MED ORDER — METRONIDAZOLE IN NACL 5-0.79 MG/ML-% IV SOLN
INTRAVENOUS | Status: DC | PRN
  Administered 2011-06-19: 500 mg via INTRAVENOUS

## 2011-06-20 NOTE — Progress Notes (Signed)
1 Day Post-Op Subjective: Patient reports that he is feeling well. He is not having significant pain or rectal drainage.  Objective: Vital signs in last 24 hours: Temp:  [96.9 F (36.1 C)-98.9 F (37.2 C)] 98.1 F (36.7 C) (05/17 0542) Pulse Rate:  [64-98] 72  (05/17 0542) Resp:  [12-18] 16  (05/17 0542) BP: (104-155)/(57-92) 121/64 mmHg (05/17 0542) SpO2:  [95 %-100 %] 99 % (05/17 0542) Weight:  [66.044 kg (145 lb 9.6 oz)] 66.044 kg (145 lb 9.6 oz) (05/16 1145)  Intake/Output from previous day: 05/16 0701 - 05/17 0700 In: 5416.3 [P.O.:600; I.V.:3416.3; IV Piggyback:500] Out: 1835 [Urine:1515; Stool:150; Blood:50] Intake/Output this shift:    Physical Exam:  Constitutional: Vital signs reviewed. WD WN in NAD    His catheter is draining fine. There is no significant hematuria  Lab Results: No results found for this basename: HGB:3,HCT:3 in the last 72 hours BMET  [facility] 06/20/11 0412  NA 138  K 3.5  CL 109  CO2 18*  GLUCOSE 92  BUN 48*  CREATININE 2.42*  CALCIUM 8.7   No results found for this basename: LABPT:3,INR:3 in the last 72 hours No results found for this basename: LABURIN:1 in the last 72 hours Results for orders placed during the hospital encounter of 06/09/11  SURGICAL PCR SCREEN     Status: Normal   Collection Time   06/09/11 11:20 AM      Component Value Range Status Comment   MRSA, PCR NEGATIVE  NEGATIVE  Final    Staphylococcus aureus NEGATIVE  NEGATIVE  Final     Studies/Results: Dg C-arm 1-60 Min-no Report  06/19/2011  CLINICAL DATA: stent placement   C-ARM 1-60 MINUTES  Fluoroscopy was utilized by the requesting physician.  No radiographic  interpretation.      Assessment/Plan:   1. Left ureteral stricture, status post cystoscopy and stent exchange on the left  2. 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 Urinary fistula with the rectum. Yesterday, this was found to be in the  prostatic urethra, not the trigone. Repair was aborted due to to enlargement of that fistula. The catheter is now in place.  4. Renal insufficiency. Creatinine is somewhat higher today. Question whether the stents are draining appropriately  I have spoken with the patient and will order renal ultrasound to make sure that the stents are adequately draining.   LOS: 1 day   [name] M 06/20/2011, 8:07 AM

## 2011-06-20 NOTE — Progress Notes (Signed)
All d/c instructions reviewed with pt and his wife and all questions and concerns were addressed. Pt given leg bag for his foley catheter that he is being discharged with. Pt to f/u next week with urology and PCP. VSS, no c/o pain, skin intact, no s/s of any distress or discomfort at this time. Pt taken out in wheel chair and sent home with wife.

## 2011-06-20 NOTE — Plan of Care (Signed)
Problem: Food- and Nutrition-Related Knowledge Deficit (NB-1.1) Goal: Nutrition education Formal process to instruct or train a patient/client in a skill or to impart knowledge to help patients/clients voluntarily manage or modify food choices and eating behavior to maintain or improve health.  Outcome: Completed/Met Date Met:  06/20/11 Per RN pt with ileostomy for the past 2 years. Met with pt and wife to review ileostomy diet. Pt reports avoiding nuts and high fiber foods r/t fear of blockage. Discussed foods that could block opening in addition to foods that can cause gas, thicken stool, and cause odors. Pt denies any difficulty with nutrition and ileostomy. Most of information for ileostomy diet pt was already following. Provided handout on ileostomy diet with RD contact information. Pt and wife expressed understanding. Also discussed high calorie/protein foods as pt reports some recent mild weight loss and will order Boost TID.

## 2011-06-20 NOTE — Progress Notes (Signed)
Eric Munoz [id number] 1940/06/20  CARE TEAM:  PCP: [name], MD, MD  Outpatient Care Team: Patient Care Team: [name], MD as PCP - General (Family Medicine) [name], MD as Consulting Physician (Urology) [name], MD as Consulting Physician (Radiation Oncology) [name], MD as Consulting Physician (Gastroenterology) Samul Dada, MD as Consulting Physician (Hematology and Oncology)  Inpatient Treatment Team: Treatment Team: Attending Provider: [name], MD; Technician: Vella Raring, NT; Technician: Hillery Aldo, NT; Registered Nurse: Talmage Nap, RN  Subjective:  Feels fine No pain Tolerating PO Walking well  Objective:  Vital signs:  Filed Vitals:   06/19/11 1800 06/19/11 2222 06/20/11 0203 06/20/11 0542  BP: 113/66 118/69 104/59 121/64  Pulse: 95 68 71 72  Temp: 98.4 F (36.9 C) 97.8 F (36.6 C) 97.8 F (36.6 C) 98.1 F (36.7 C)  TempSrc: Oral Oral Oral Oral  Resp: 18 16 16 16   Height:      Weight:      SpO2: 100% 99% 97% 99%    Last BM Date: 06/19/11  Intake/Output   Yesterday:  05/16 0701 - 05/17 0700 In: 5416.3 [P.O.:600; I.V.:3416.3; IV Piggyback:500] Out: 1835 [Urine:1515; Stool:150; Blood:50] This shift:  Total I/O In: 240 [P.O.:240] Out: -   Bowel function:  Flatus: y  BM: y in bag  Physical Exam:  General: Pt awake/alert/oriented x4 in no acute distress Eyes: PERRL, normal EOM.  Sclera clear.  No icterus Neuro: CN II-XII intact w/o focal sensory/motor deficits. Lymph: No head/neck/groin lymphadenopathy Psych:  No delerium/psychosis/paranoia HENT: Normocephalic, Mucus membranes moist.  No thrush Neck: Supple, No tracheal deviation Chest: No chest wall pain w good excursion CV:  Pulses intact.  Regular rhythm Abdomen: Soft.  Nondistended.  No incarcerated hernias. Ext:  SCDs BLE.  No mjr edema.  No cyanosis Skin: No petechiae / purpurae  Results:    Labs: Results for orders placed during the hospital encounter of 06/19/11 (from the past 48 hour(s))  TYPE AND SCREEN     Status: Normal   Collection Time   06/19/11  5:50 AM      Component Value Range Comment   ABO/RH(D) A POS      Antibody Screen NEG      Sample Expiration 06/22/2011     BASIC METABOLIC PANEL     Status: Abnormal   Collection Time   06/20/11  4:12 AM      Component Value Range Comment   Sodium 138  135 - 145 (mEq/L)    Potassium 3.5  3.5 - 5.1 (mEq/L)    Chloride 109  96 - 112 (mEq/L)    CO2 18 (*) 19 - 32 (mEq/L)    Glucose, Bld 92  70 - 99 (mg/dL)    BUN 48 (*) 6 - 23 (mg/dL)    Creatinine, Ser 1.47 (*) 0.50 - 1.35 (mg/dL)    Calcium 8.7  8.4 - 10.5 (mg/dL)    GFR calc non Af Amer 25 (*) >90 (mL/min)    GFR calc Af Amer 30 (*) >90 (mL/min)     Imaging / Studies: Dg C-arm 1-60 Min-no Report  06/19/2011  CLINICAL DATA: stent placement   C-ARM 1-60 MINUTES  Fluoroscopy was utilized by the requesting physician.  No radiographic  interpretation.      Medications / Allergies: per chart  Antibiotics: Anti-infectives     Start     Dose/Rate Route Frequency Ordered Stop   06/19/11 0926   polymyxin  B 500,000 Units, bacitracin 50,000 Units in sodium chloride irrigation 0.9 % 500 mL irrigation  Status:  Discontinued          As needed 06/19/11 0926 06/19/11 0936   06/19/11 0533   ceFAZolin (ANCEF) IVPB 2 g/50 mL premix        2 g 100 mL/hr over 30 Minutes Intravenous 60 min pre-op 06/19/11 0533 06/19/11 0730          Problem List:  Principal Problem:  *Colourethral fistula  Active Problems:  History of colon cancer, stage IV, Stage 4, [id number] (Chem last Feb12/ Radiation-last [id number])  Ureteral stricture, left  Loop diverting ileostomy, 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 male  1 Day Post-Op  Procedure(s): CYSTOSCOPY WITH RETROGRADE  PYELOGRAM/URETERAL STENT PLACEMENT EXAM UNDER ANESTHESIA  Stable after surgery except for increased BUN and creatinine  Plan:  He is stable will be from a general surgical standpoint. I agree with Renal U/S in working up the increased creatinine. The ultrasound did not seem to show any obvious hydronephrosis.  I gave some IV fluids. In may follow him another day if Dr. Hillis Range is concerned.    I long discussion with the patient about the intraoperative findings. Had a similar one with his wife after surgery yesterday. This is a a large fistula of the prostatic urethra / bladder neck.  It'll require a muscle flap such as sartorius her biceps for more [place] to that. I need help with a plastic reconstructive surgeon in town to do this. I'm trying to see if one is available I do not think that the coloanal anastomosis is salvageable since it has dehisced. He is interested in having a colostomy instead of a loop ileostomy. He's worries that he still is dependent on antidiarrheals and can get dehydrated.  He is worried about his weight loss. I agree. The PET scan is reassuring that there is no recurrent cancer. I see no evidence of infection. I don't sense that he has a massive dehydration going on in his output from his ileostomy is in the normal range, 5-6 halfback feels a day. He claims he is eating and drinking well. We'll get some nutrition labs. We'll get some urine electrolytes  He had questions about concerns of other possible etiologies such as hypothyroidism and diabetes. I can check some baseline labs, however, I strongly recommend he try and reestablish with his primary care physician to make sure his overall health is okay. He tends to avoid doctors. He's quite active for 71 year old but his weight loss is a concern. In the absence of recurrent cancer, I think other etiologies need to be isolated. I am skeptical that the fistula which is controlled to be the source of all this problem. He  agrees.  Again discussed that it is reasonable to get another opinion at a major academic center. He will think about things and let me know.  -VTE prophylaxis- SCDs, etc -mobilize as tolerated to help recovery  [name], M.D., F.A.C.S. Gastrointestinal and Minimally Invasive Surgery [facility] [address], [address] [place], [place] [address] [phone] Main / Paging [phone] Voice Mail   06/20/2011

## 2011-06-20 NOTE — Addendum Note (Signed)
Addendum  created 06/20/11 1610 by Valeda Malm, CRNA   Modules edited:Anesthesia Medication Administration

## 2011-06-23 ENCOUNTER — Encounter (INDEPENDENT_AMBULATORY_CARE_PROVIDER_SITE_OTHER): Payer: Self-pay

## 2011-06-23 NOTE — Op Note (Signed)
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 endotracheal  Indications: 71 year old male with a history of recurrent colon cancer, status post initial resection a few years ago with repeat colectomy/retroperitoneal mass resection approximately 2 years ago. The patient has had an indwelling left ureteral stent due to a significant proximal ureteral stricture. He underwent laparoscopic colectomy with low anterior anastomosis several months ago. Because of the difficult dissection, there was a bladder injury which was repaired during the procedure. The patient developed fever and flank pain after that procedure, and it was evident that he had right distal ureteral obstruction. He was initially treated with a percutaneous tube, which was then converted to a double-J stent.  The patient was felt to have a rectovesical fistula, evident by prior cystoscopy and stent exchange on my part. There was inflammation in the trigone.  He presents at this point for possible fistula repair, and concert with Dr. Star Age. At the same time, we will exchanges stents. Risks and complications of the procedure have been discussed with the patient who understands these and desires to proceed.  Description of procedure: The patient was properly identified in the holding area and received preoperative IV antibiotics previous taken the operating room where general endotracheal anesthetic was established. He was placed in the modified lithotomy position. Extremities were padded appropriately. PAS hose were placed. His abdomen and perineum were prepped and draped. Timeout was then performed.  The procedure commenced. I placed a 22 French panendoscope through his  urethra and into his bladder. Bladder was inspected circumferentially. Each stent was seen coming through the ureteral orifice he is. There was inflammation within the trigone which did calm down significantly since his prior cystoscopy. In scoping him, it was evident that the fistula was now in his prostatic urethra posteriorly, approximately 1 cm from the bladder neck. This is approximately 5 mm in size. There was no evidence inflammatory process around this.  Both stents were then exchanged using fluoroscopic and cystoscopic guidance. 7 French, 24 cm contour stent were placed over a guidewire following partial extraction of the stents. Following placement of each stent, good proximal and distal curls were seen fluoroscopically and cystoscopically. At this point, a Foley catheter was placed. He was hooked to dependent drainage.  Following dilatation of the patient's significant rectal anastomotic stricture, it was evident that a significant length of the ureteral catheter was present/palpable within the rectum. I then removed this Foley catheter, and attempted replacement with a coud-tip catheter angled anteriorly. Unfortunately, the same length of catheter was palpable within the rectum at this point. I then repeated cystoscopy. This revealed that the fistula had opened up significantly, at least for a visible length of approximately 12 mm. I then navigated a guidewire into the bladder, and over top of this placed a council tip catheter. Adequate placement was verified by irrigation of the catheter. Palpation at this point still revealed a significant length of the catheter to be present/palpable to the transrectal examination.  At this point, Dr. Michaell Cowing and I had extensive conversations/discussion about the patient's condition. Options at this point included a perineal approach to repair, which would necessitate more than likely a significant flap of muscle from his thigh. At this point, we were not  prepared to proceed with that, due to the complex nature of the repair. It was felt that, in  all likelihood he would need a significant reconstructive effort, and neither of [place] felt that this was within her scope of practice. At this point, following extensive discussion, the patient was awakened. He was then taken to the PACU in stable condition.  Dr. gross and I both discussed this change of plan with the patients wife. We will work on setting up an appointment for proper referral for fistula treatment.

## 2011-06-25 ENCOUNTER — Encounter: Payer: Self-pay | Admitting: Dietician

## 2011-06-25 NOTE — Progress Notes (Signed)
Brief Out-patient Oncology Nutrition Note  Reason: Positive Nutrition Risk Screen for unintentional weight loss and decreased appetite.  Eric Munoz is a 71 year old male patient of Dr. Arline Asp, diagnosed with colon cancer. Attempted to contact Eric Munoz via home telephone number for positive nutrition risk. Left patient voice mail with RD contact information.   Height: 69" Weight 145.6 lb. BMI: 21.49 kg/m^2 (WNL)  Wt Readings from Last 10 Encounters:  06/19/11 145 lb 9.6 oz (66.044 kg)  06/19/11 145 lb 9.6 oz (66.044 kg)  06/10/11 140 lb 8 oz (63.73 kg)  06/09/11 141 lb (63.957 kg)  05/23/11 139 lb (63.05 kg)  04/10/11 149 lb 6.4 oz (67.767 kg)  03/10/11 150 lb 9.6 oz (68.312 kg)  02/07/11 147 lb 6.4 oz (66.86 kg)  02/06/11 144 lb 12.8 oz (65.681 kg)  01/20/11 141 lb 6.4 oz (64.139 kg)   RD available for nutrition needs.  Iven Finn [facility] [phone]

## 2011-06-26 ENCOUNTER — Encounter: Payer: Self-pay | Admitting: Dietician

## 2011-06-26 ENCOUNTER — Telehealth: Payer: Self-pay | Admitting: Medical Oncology

## 2011-06-26 NOTE — Telephone Encounter (Signed)
Susan-nurse with Dr. Hyacinth Meeker called and states that they saw pt and his HGB has dropped from 10.6 to 9.0. She is going to fax pt's office notes and lab results for Dr. Arline Asp to review.

## 2011-06-26 NOTE — Progress Notes (Signed)
Brief Out-patient Oncology Nutrition Note  Reason: Positive Nutrition Risk for unintentional weight loss and decreased appetite.  Eric Munoz is a 71 year old male patient of Dr. Arline Asp, diagnosed with Colon Cancer. Attempted to contact patient 5/22, left voice mail. Patient contacted RD via telephone on 5/23.   Patient reported his appetite has been good. He stated he is eating well and drinking 1 Boost nutrition supplement daily. He reported his weight is staying stable. He denies any problems with nausea.  I have encouraged the patient to consume a high calorie, high protein diet. The patient is without any nutrition related questions or concerns. Provided RD contact information and instructed patient to contact RD if nutrition concerns arise.   RD available for nutrition needs.   Eric Munoz [facility] [phone]

## 2011-07-01 NOTE — Discharge Summary (Signed)
Eric Munoz  [id number]  1940-11-26  CARE TEAM:  PCP: [name], MD, MD  Outpatient Care Team: Patient Care Team:  [name], MD as PCP - General (Family Medicine)  [name], MD as Consulting Physician (Urology)  [name], MD as Consulting Physician (Radiation Oncology)  [name], MD as Consulting Physician (Gastroenterology)  Samul Dada, MD as Consulting Physician (Hematology and Oncology)  Inpatient Treatment Team: Treatment Team: Attending Provider: [name], MD; Technician: Vella Raring, NT; Technician: Hillery Aldo, NT; Registered Nurse: Talmage Nap, RN  Subjective:  Feels fine  No pain  Tolerating PO  Walking well  Objective:  Vital signs:  Filed Vitals:    06/19/11 1800  06/19/11 2222  06/20/11 0203  06/20/11 0542   BP:  113/66  118/69  104/59  121/64   Pulse:  95  68  71  72   Temp:  98.4 F (36.9 C)  97.8 F (36.6 C)  97.8 F (36.6 C)  98.1 F (36.7 C)   TempSrc:  Oral  Oral  Oral  Oral   Resp:  18  16  16  16    Height:       Weight:       SpO2:  100%  99%  97%  99%    Last BM Date: 06/19/11  Intake/Output  Yesterday:  05/16 0701 - 05/17 0700  In: 5416.3 [P.O.:600; I.V.:3416.3; IV Piggyback:500]  Out: 1835 [Urine:1515; Stool:150; Blood:50]  This shift:  Total I/O  In: 240 [P.O.:240]  Out: -  Bowel function:  Flatus: y  BM: y in bag  Physical Exam:  General: Pt awake/alert/oriented x4 in no acute distress  Eyes: PERRL, normal EOM. Sclera clear. No icterus  Neuro: CN II-XII intact w/o focal sensory/motor deficits.  Lymph: No head/neck/groin lymphadenopathy  Psych: No delerium/psychosis/paranoia  HENT: Normocephalic, Mucus membranes moist. No thrush  Neck: Supple, No tracheal deviation  Chest: No chest wall pain w good excursion  CV: Pulses intact. Regular rhythm  Abdomen: Soft. Nondistended. No incarcerated hernias.  Ext: SCDs BLE. No mjr edema. No cyanosis  Skin: No petechiae /  purpurae  Results:  Labs:  Results for orders placed during the hospital encounter of 06/19/11 (from the past 48 hour(s))   TYPE AND SCREEN Status: Normal    Collection Time    06/19/11 5:50 AM   Component  Value  Range  Comment    ABO/RH(D)  A POS      Antibody Screen  NEG      Sample Expiration  06/22/2011     BASIC METABOLIC PANEL Status: Abnormal    Collection Time    06/20/11 4:12 AM   Component  Value  Range  Comment    Sodium  138  135 - 145 (mEq/L)     Potassium  3.5  3.5 - 5.1 (mEq/L)     Chloride  109  96 - 112 (mEq/L)     CO2  18 (*)  19 - 32 (mEq/L)     Glucose, Bld  92  70 - 99 (mg/dL)     BUN  48 (*)  6 - 23 (mg/dL)     Creatinine, Ser  6.96 (*)  0.50 - 1.35 (mg/dL)     Calcium  8.7  8.4 - 10.5 (mg/dL)     GFR calc non Af Amer  25 (*)  >90 (mL/min)     GFR calc Af Amer  30 (*)  >  90 (mL/min)     Imaging / Studies:  Dg C-arm 1-60 Min-no Report  06/19/2011 CLINICAL DATA: stent placement C-ARM 1-60 MINUTES Fluoroscopy was utilized by the requesting physician. No radiographic interpretation.   Medications / Allergies: per chart  Antibiotics:  Anti-infectives     Start    Dose/Rate  Route  Frequency  Ordered  Stop     06/19/11 0926   polymyxin B 500,000 Units, bacitracin 50,000 Units in sodium chloride irrigation 0.9 % 500 mL irrigation Status: Discontinued    As needed  06/19/11 0926  06/19/11 0936                06/19/11 0533    ceFAZolin (ANCEF) IVPB 2 g/50 mL premix  2 g  100 mL/hr over 30 Minutes  Intravenous  60 min pre-op  06/19/11 0533  06/19/11 0730                     Problem List:  Principal Problem:  *Colourethral fistula  Active Problems:  History of colon cancer, stage IV, Stage 4, [id number] (Chem last Feb12/ Radiation-last Jan12)  Ureteral stricture, left  Loop diverting ileostomy, 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 male  1 Day  Post-Op Procedure(s):  CYSTOSCOPY WITH RETROGRADE PYELOGRAM/URETERAL STENT PLACEMENT  EXAM UNDER ANESTHESIA  Stable after surgery except for increased BUN and creatinine  Plan:  He is stable will be from a general surgical standpoint.   Renal U/S. The ultrasound did not seem to show any obvious hydronephrosis.    Dr. Hillis Range is OK w D/c  I long discussion with the patient about the intraoperative findings. Had a similar one with his wife after surgery yesterday. This is a a large fistula of the prostatic urethra / bladder neck. It'll require a muscle flap such as sartorius her biceps for more [place] to that. I need help with a plastic reconstructive surgeon in town to do this. I'm trying to see if one is available I do not think that the coloanal anastomosis is salvageable since it has dehisced. He is interested in having a colostomy instead of a loop ileostomy. He's worries that he still is dependent on antidiarrheals and can get dehydrated.  He is worried about his weight loss. I agree. The PET scan is reassuring that there is no recurrent cancer. I see no evidence of infection. I don't sense that he has a massive dehydration going on in his output from his ileostomy is in the normal range, 5-6 halfback feels a day. He claims he is eating and drinking well. We'll get some nutrition labs. We'll get some urine electrolytes  He had questions about concerns of other possible etiologies such as hypothyroidism and diabetes. I can check some baseline labs, however, I strongly recommend he try and reestablish with his primary care physician to make sure his overall health is okay. He tends to avoid doctors. He's quite active for 71 year old but his weight loss is a concern. In the absence of recurrent cancer, I think other etiologies need to be isolated. I am skeptical that the fistula which is controlled to be the source of all this problem. He agrees.   Again discussed that it is reasonable to get another  opinion at a major academic center. He will think about things and let me know.   F/u with Urology / Dr, Hillis Range  [name], M.D., F.A.C.S.  Gastrointestinal and Minimally Invasive Surgery  [facility]  [address]  [place], [place] [address]  [phone] Main / Paging  [phone] Voice Mail  06/20/2011

## 2011-07-10 ENCOUNTER — Encounter (INDEPENDENT_AMBULATORY_CARE_PROVIDER_SITE_OTHER): Payer: Self-pay | Admitting: Surgery

## 2011-07-10 ENCOUNTER — Ambulatory Visit (INDEPENDENT_AMBULATORY_CARE_PROVIDER_SITE_OTHER): Payer: BC Managed Care – PPO | Admitting: Surgery

## 2011-07-10 VITALS — BP 118/70 | HR 66 | Temp 98.1°F | Resp 14 | Ht 69.0 in | Wt 139.0 lb

## 2011-07-10 DIAGNOSIS — Z932 Ileostomy status: Secondary | ICD-10-CM

## 2011-07-10 DIAGNOSIS — N360 Urethral fistula: Secondary | ICD-10-CM

## 2011-07-10 DIAGNOSIS — Z85038 Personal history of other malignant neoplasm of large intestine: Secondary | ICD-10-CM

## 2011-07-10 NOTE — Patient Instructions (Signed)
Call me next week & after your [facility] visit

## 2011-07-10 NOTE — Progress Notes (Signed)
Subjective:     Patient ID: Eric Munoz, male   DOB: Sep 07, 1940, 71 y.o.   MRN: [id number]  HPI  Eric Munoz  1940-05-17 [id number]  Patient Care Team: Eric Matar, MD as Consulting Physician (Urology) Eric Coup, MD as Consulting Physician (Radiation Oncology) Eric Shiver, MD as Consulting Physician (Gastroenterology) Eric Dada, MD as Consulting Physician (Hematology and Oncology) Eric Hazel, MD as Attending Physician (Family Medicine)  This patient is a 71 y.o.male who presents today for surgical evaluation.   Reason for visit: Followup after examination under anesthesia/cystoscopy.  Eric Munoz comes in today doing well. Had his Foley switched out. Stents are stable. He underwent examination under anesthesia last month. The fistula was more of a rectourethral fistula. We aborted a repair given the fact that this will require muscle flap assistance and that was not immediately available  He is due for have a second opinion at [facility]. Waiting to hear back from the plastic reconstruction surgeon in town to see if a muscle flap can be done at the same time of this repair.    He denies pain no nausea or vomiting. Using supplemental shakes and gaining some weight back. Energy level better  Patient Active Problem List  Diagnoses  . History of adenomatous polyp of rectum s/p low LAR  . History of colon cancer, stage IV, Stage 4, [id number] (Chem last Feb12/ Radiation-last [id number])  . Ureteral stricture, left  . 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 Colourethral fistula   . Renal insufficiency  . Unspecified deficiency anemia    Past Medical History  Diagnosis Date  . Easy bruising   . 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 S/P chemotherapy, time since greater than 12 weeks 03-2010 last chemo  . S/P radiation > 12 weeks 02-2010    last radiation  . Urinoma, s/p bladder repair  01/20/2011  . Colon cancer 2007, recurrent Jul2011    Recurrent, Stage 4, [id number] (Left colectomy/LAR , ChemoTx completed Feb2012/ XRT completed Jan2012  . Acute renal failure (ARF), resolving 12/23/2010  . Difficult intubation     Past Surgical History  Procedure Date  . Portacath placement 12-11-10    09-17-09 right chest/ states removed 12-20-10  . Colostomy takedown 12/20/2010    Procedure: LAPAROSCOPIC COLOSTOMY TAKEDOWN;  Surgeon: [name], MD;  Location: [facility];  Service: General;  Laterality: N/A;  Laparoscopy Colostomy Takedownn With Ileostomy  . Port-a-cath removal 12/20/2010    Procedure: REMOVAL PORT-A-CATH;  Surgeon: [name], MD;  Location: [facility];  Service: General;  Laterality: Right;  Right  venous catheter no longer needed.  . Cystoscopy w/ ureteral stent placement 01/13/2011    Procedure: CYSTOSCOPY WITH RETROGRADE PYELOGRAM/URETERAL STENT PLACEMENT;  Surgeon: Eric Munoz;  Location: [facility];  Service: Urology;  Laterality: Left;  intra-operative cystogram  . Cystoscopy w/ ureteral stent placement 01/13/2011    Procedure: CYSTOSCOPY WITH RETROGRADE PYELOGRAM/URETERAL STENT PLACEMENT;  Surgeon: Eric Munoz;  Location: [facility];  Service: Urology;  Laterality: Left;  intra-operative cystogram  . Tonsillectomy yrs ago  . Colon surgery 08/07/09    Low Anterior Rescection, resection of L ext iliac artery,  ureterolysis  . Fracture surgery 1950'S    RIGHT ARM SURGERY FOR FX  . Examination under anesthesia 06/19/2011    Procedure: EXAM UNDER ANESTHESIA;  Surgeon: [name], MD;  Location: [facility];  Service: General;;  .  Cystoscopy w/ ureteral stent placement 06/19/2011    Procedure: CYSTOSCOPY WITH RETROGRADE PYELOGRAM/URETERAL STENT PLACEMENT;  Surgeon: Eric Matar, MD;  Location: [facility];  Service: Urology;  Laterality: Bilateral;  Cystoscopy/Bilateral Double J Stent insertion , flexible cystoscopy     History   Social History  . Marital  Status: Married    Spouse Name: N/A    Number of Children: N/A  . Years of Education: N/A   Occupational History  . Not on file.   Social History Main Topics  . Smoking status: Former Smoker -- 1.0 packs/day for 10 years    Quit date: 02/03/1974  . Smokeless tobacco: Never Used  . Alcohol Use: No  . Drug Use: No  . Sexually Active: Yes   Other Topics Concern  . Not on file   Social History Narrative  . No narrative on file    Family History  Problem Relation Age of Onset  . Stroke Father 51  . Cancer Father     colon  . Lupus Daughter     Current Outpatient Prescriptions  Medication Sig Dispense Refill  . acetaminophen (TYLENOL) 325 MG tablet Take 650 mg by mouth every 6 (six) hours as needed. Pain        . loperamide (IMODIUM A-D) 2 MG tablet Take 2 mg by mouth 3 (three) times daily.       . mirtazapine (REMERON) 15 MG tablet Take 15 mg by mouth at bedtime.      . nutritional supplement (BOOST HIGH PROTEIN) LIQD Take 1 Can by mouth daily with breakfast. Per patient taking supplement that is in plastic bottle. Was not an option in database.         No Known Allergies  BP 118/70  Pulse 66  Temp 98.1 F (36.7 C)  Resp 14  Ht 5\' 9"  (1.753 m)  Wt 139 lb (63.05 kg)  BMI 20.53 kg/m2  US Renal  06/20/2011  *RADIOLOGY REPORT*  Clinical Data: Elevated creatinine.  RENAL/URINARY TRACT ULTRASOUND COMPLETE  Comparison:  CT scan 02/24/2011.  Findings:  Right Kidney:  12.5 cm in length. Normal renal cortical thickness and echogenicity without focal lesions or hydronephrosis.  A ureteral stent is noted.  Left Kidney:  12.1 cm in length. Normal renal cortical thickness and echogenicity without focal lesions or hydronephrosis.  Ureteral stent not definitely visualized.  Bladder:  Decompressed by a Foley catheter.  IMPRESSION: Normal appearance of the renal parenchyma and no hydronephrosis.  Original Report Authenticated By: P. Loralie Champagne, M.D.   Nm Pet Image Restag (ps) Skull  Base To Thigh  06/16/2011  *RADIOLOGY REPORT*  Clinical Data: Subsequent treatment strategy for stage IV colon cancer.  NUCLEAR MEDICINE PET SKULL BASE TO THIGH  Fasting Blood Glucose:  106  Technique:  15.2 mCi F-18 FDG was injected intravenously. CT data was obtained and used for attenuation correction and anatomic localization only.  (This was not acquired as a diagnostic CT examination.) Additional exam technical data entered on technologist worksheet.  Comparison:  CT pelvis dated 06/11/2011.  CT abdomen pelvis dated 02/24/2011.  PET CT dated 11/29/2010.  Findings:  Neck: No hypermetabolic lymph nodes in the neck.  Right thyroid nodule.  Chest:  No hypermetabolic mediastinal or hilar nodes.  No suspicious pulmonary nodules on the CT scan.  Abdomen/Pelvis:  No abnormal hypermetabolic activity within the liver, pancreas, adrenal glands, or spleen.  No hypermetabolic lymph nodes in the abdomen or pelvis.  Partial left hemicolectomy.  Diverting ileostomy in the  right lower abdomen (series 2/image 171).  Bilateral double pigtail ureteral stents with associated moderate hydroureteronephrosis.  The distal pigtail of the left ureteral stent is positioned at/near the UVJ and may project outside of the expected location of the posterior bladder wall (series 2/image 223).  Associated 4.7 x 3.8 cm presacral fluid collection (series 2/image 223), with associated hypermetabolism, previously confirmed to reflect a bladder leak.  Indwelling Foley catheter with gas within the bladder.  [place]:  No focal hypermetabolic activity to suggest skeletal metastasis.  IMPRESSION: No evidence of recurrent or metastatic disease.  Stable double pigtail ureteral stents and indwelling Foley catheter with associated presacral fluid collection, previously confirmed to reflect a bladder leak.  Original Report Authenticated By: Charline Bills, M.D.   Dg C-arm 1-60 Min-no Report  06/19/2011  CLINICAL DATA: stent placement   C-ARM 1-60  MINUTES  Fluoroscopy was utilized by the requesting physician.  No radiographic  interpretation.      Review of Systems  Constitutional: Negative for fever, chills and diaphoresis.  HENT: Negative for sore throat, trouble swallowing and neck pain.   Eyes: Negative for photophobia and visual disturbance.  Respiratory: Negative for choking and shortness of breath.   Cardiovascular: Negative for chest pain and palpitations.  Gastrointestinal: Negative for nausea, vomiting, abdominal distention, anal bleeding and rectal pain.  Genitourinary: Negative for hematuria and testicular pain.  Musculoskeletal: Negative for myalgias, arthralgias and gait problem.  Skin: Negative for color change and rash.  Neurological: Negative for dizziness, speech difficulty, weakness and numbness.  Hematological: Negative for adenopathy.  Psychiatric/Behavioral: Negative for hallucinations, confusion and agitation.       Objective:   Physical Exam  Constitutional: He is oriented to person, place, and time. He appears well-developed and well-nourished. No distress.  HENT:  Head: Normocephalic.  Mouth/Throat: Oropharynx is clear and moist. No oropharyngeal exudate.  Eyes: Conjunctivae and EOM are normal. Pupils are equal, round, and reactive to light. No scleral icterus.  Neck: Normal range of motion. No tracheal deviation present.  Cardiovascular: Normal rate, normal heart sounds and intact distal pulses.   Pulmonary/Chest: Effort normal. No respiratory distress.  Abdominal: Soft. He exhibits no distension. There is no tenderness. There is no CVA tenderness. No hernia. Hernia confirmed negative in the right inguinal area and confirmed negative in the left inguinal area.         Incisions clean with normal healing ridges.  No hernias  Musculoskeletal: Normal range of motion. He exhibits no tenderness.  Neurological: He is alert and oriented to person, place, and time. No cranial nerve deficit. He exhibits  normal muscle tone. Coordination normal.  Skin: Skin is warm and dry. No rash noted. He is not diaphoretic.  Psychiatric: He has a normal mood and affect. His behavior is normal.       Assessment:     Urethral fistula. Question of coloanal stapled erosion versus breakdown.    Plan:     I again discussed with him his options. I think it is a smart idea to get a second opinion.  He needs muscle flap reinforcement of any urethral repair given indeed a radiated pelvis and breakdown under better conditions. Would be a perineal approach. I called Dr. Krista Blue. She is hoping to have help to see if a sartorial/biceps femoris rotational flap is feasible in town. If not, he will need this repaired on an another major center.  I do not think I can salvage the anastomosis. It is nearly fully dehisced. I would  convert him from a loop ileostomy to an end colostomy per his wishes. He was hoping that I would try and reconstruct the coloanal anastomosis. While I think he does have some sphincter tone, the area is very strictured down, and I am very skeptical that a reattempt is going to work. The last attempt under better conditions. This is very strictured down.  I will have him know if plastic reconstructive rotational flap is available in town. He will call me after he sees the [facility] team

## 2011-07-21 ENCOUNTER — Telehealth: Payer: Self-pay

## 2011-07-21 ENCOUNTER — Telehealth (INDEPENDENT_AMBULATORY_CARE_PROVIDER_SITE_OTHER): Payer: Self-pay | Admitting: General Surgery

## 2011-07-21 NOTE — Telephone Encounter (Signed)
Reached Dr. Kelly Splinter today, but she nor her colleague cannot perform muscle flap reconstruction in town at this time nor in the near future.    I called Eric Munoz back to explain this.  I recommended he keep his appt with [facility] to see if they have all the specialists available for the rectourethral fictula repair with muscle flap buttress of the repair.  He expressed understanding.

## 2011-07-21 NOTE — Telephone Encounter (Signed)
Pt calling to ask if Dr. Michaell Cowing had reached the specialist he talked about?  Pt very vague and unable to offer any information.

## 2011-07-21 NOTE — Telephone Encounter (Signed)
Pt called requesting a disc of his scans in May to take to [facility] tomorrow. Called radiology to get disc ready. Explained to pt he needs to come by radiology to pick up the disc later today. He expressed willingness.

## 2011-08-11 ENCOUNTER — Telehealth: Payer: Self-pay | Admitting: Nurse Practitioner

## 2011-08-11 ENCOUNTER — Ambulatory Visit (HOSPITAL_BASED_OUTPATIENT_CLINIC_OR_DEPARTMENT_OTHER): Payer: BC Managed Care – PPO | Admitting: Oncology

## 2011-08-11 ENCOUNTER — Encounter: Payer: Self-pay | Admitting: Oncology

## 2011-08-11 ENCOUNTER — Other Ambulatory Visit (HOSPITAL_BASED_OUTPATIENT_CLINIC_OR_DEPARTMENT_OTHER): Payer: BC Managed Care – PPO | Admitting: Lab

## 2011-08-11 ENCOUNTER — Telehealth: Payer: Self-pay | Admitting: *Deleted

## 2011-08-11 VITALS — BP 103/67 | HR 85 | Temp 98.2°F | Ht 69.0 in | Wt 141.5 lb

## 2011-08-11 DIAGNOSIS — C19 Malignant neoplasm of rectosigmoid junction: Secondary | ICD-10-CM

## 2011-08-11 DIAGNOSIS — N289 Disorder of kidney and ureter, unspecified: Secondary | ICD-10-CM

## 2011-08-11 DIAGNOSIS — Z85038 Personal history of other malignant neoplasm of large intestine: Secondary | ICD-10-CM

## 2011-08-11 DIAGNOSIS — D539 Nutritional anemia, unspecified: Secondary | ICD-10-CM

## 2011-08-11 LAB — COMPREHENSIVE METABOLIC PANEL
ALT: 22 U/L (ref 0–53)
AST: 15 U/L (ref 0–37)
Albumin: 3.6 g/dL (ref 3.5–5.2)
CO2: 23 mEq/L (ref 19–32)
Calcium: 9.2 mg/dL (ref 8.4–10.5)
Chloride: 108 mEq/L (ref 96–112)
Creatinine, Ser: 1.83 mg/dL — ABNORMAL HIGH (ref 0.50–1.35)
Potassium: 4.7 mEq/L (ref 3.5–5.3)
Sodium: 138 mEq/L (ref 135–145)
Total Protein: 6.4 g/dL (ref 6.0–8.3)

## 2011-08-11 LAB — CBC WITH DIFFERENTIAL/PLATELET
BASO%: 0.1 % (ref 0.0–2.0)
EOS%: 1.3 % (ref 0.0–7.0)
HCT: 31.4 % — ABNORMAL LOW (ref 38.4–49.9)
HGB: 10.4 g/dL — ABNORMAL LOW (ref 13.0–17.1)
MCHC: 33.1 g/dL (ref 32.0–36.0)
MONO#: 0.8 10*3/uL (ref 0.1–0.9)
NEUT%: 79.4 % — ABNORMAL HIGH (ref 39.0–75.0)
RDW: 16.7 % — ABNORMAL HIGH (ref 11.0–14.6)
WBC: 5.5 10*3/uL (ref 4.0–10.3)
lymph#: 0.3 10*3/uL — ABNORMAL LOW (ref 0.9–3.3)

## 2011-08-11 LAB — LACTATE DEHYDROGENASE: LDH: 67 U/L — ABNORMAL LOW (ref 94–250)

## 2011-08-11 LAB — FERRITIN: Ferritin: 147 ng/mL (ref 22–322)

## 2011-08-11 NOTE — Telephone Encounter (Signed)
Gave the patient appointment 10-20-2011 starting at 8:30am printed out calendar and gave to the patient

## 2011-08-11 NOTE — Progress Notes (Signed)
This office note has been dictated.  #[id number]

## 2011-08-11 NOTE — Telephone Encounter (Signed)
Called patient and confirmed his foley catheter is still in place- per Dr. Mamie Levers request.

## 2011-08-11 NOTE — Progress Notes (Signed)
CC:   [name], MD Radene Gunning, M.D., Ph.D. [name], M.D. [name], M.D. [name], M.D.    PROBLEM LIST:  1. 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 Diagnosis she  was established in July of 2011. The patient underwent rather  extensive surgery on both July 5th and August 08, 2009. There were  multiple tumor nodules. Six out of 26 lymph nodes were involved  along with adjacent small bowel. There was involvement of a  periaortic lymph node. There was a close radial margin. There was  tumor thrombus in a vein. There was extracapsular extension,  lymphovascular and perineural invasion, invasion of tumor into the  left external iliac artery and left ureter. KRAS mutation was not  detected, and thus the tumor is KRAS wild type. Surgery involved a  colostomy and placement of a left ureteral stent. There was a  question of whether this represented a de novo cancer or possibly  recurrence from a previous sigmoid well-differentiated  adenocarcinoma that was resected by Dr. [name] on  03/15/2004. This tumor arose apparently in a polyp with 0/3 lymph  nodes and was felt to be T1 stage I. The margin, however, was 0.5  mm. Post surgical treatments consisted of 7 cycles of FOLFOX with  treatments from 06/26/2009 through 12/24/2009. The patient  received pelvic radiation with continuous infusion 5-fluorouracil  from 01/07/2010 through 02/15/2009. The patient then received 3  cycles of 5-FU leucovorin and 5-FU by continuous infusion from  03/04/2010 through 04/01/2010. 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 PET scan carried out on 06/16/2011 showed no evidence of recurrent or metastatic disease.   2. Ileostomy, 12/20/2010.  3. Right leg weakness secondary to femoral nerve injuries following  the patient's surgery in July 2011. Currently resolved.  4. The  patient underwent extensive surgery on December 20, 2010. This  consisted of lysis of adhesions, laparoscopic-assisted splenic  flexure mobilization, laparoscopic colostomy takedown, primary  repair of bladder injury, diverting loop ileostomy in the right  upper quadrant, and removal of the patient's Port-A-Cath.  5. Bilateral ureteral stents.  6. 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  4. Progressive weight loss.  5. Anemia noted in January 2013 with prior history of iron-deficiency  anemia requiring intravenous Feraheme dating back to August 2011.  6. Development of renal insufficiency noted in May 2013.    MEDICATIONS:  1. Imodium 2 tablets 3 times a day.  2. Boost 1 can daily. The patient has been urged to increase this in  view of his weight loss. 3. Remeron 7.5 mg at bedtime to stimulate appetite.   HISTORY:  Eric Munoz was seen today for followup of his adenocarcinoma of the rectosigmoid, stage IV, T4b, N2a, M1a with carcinoma involving periaortic lymph nodes at the time of surgery in early July 2011.  The patient's tumor is KRAS wild type.  The patient remains disease free as per a PET scan carried out on 06/16/2011.  The patient underwent cystoscopy and bilateral ureteral catheter stent exchanges on 06/19/2011 by Dr. [name].  Originally there were plans underway for the patient have surgery here in [place] to try to deal with his colovesical fistula.  It was decided however to refer the patient to Dr. Vonita Moss from [facility] who evaluated the patient a couple of weeks ago.  A cystoscopy was carried out by Dr. Vonita Moss.  Currently Eric Munoz is  waiting to hear from Dr. Vonita Moss regarding his recommendations regarding possible surgical correction of his colovesical fistula.  In the meantime, the patient's clinical condition is stable.  He thinks he is coming down with another urine infection.  Urine has developed an odor.  The patient is really without  any major complaints.  He denies any pain, any symptoms to suggest recurrence of his colon cancer.  His weight is fairly stable.  Energy and appetite are fair.  Of concern recently has been some deterioration in the patient's renal function. He did undergo an ultrasound ordered by Dr. [name] on 06/20/2011 that did not show any evidence of obstruction.  PHYSICAL EXAM:  The patient shows no obvious changes.  Weight today is 141.5 pounds as compared with 140.5 pounds 2 months ago.  Height 5 feet 9 inches, body surface area 1.77 m2.  Blood pressure 103/67.  Other vital signs are normal.  There is no scleral icterus.  Mouth and pharynx are benign.  No peripheral adenopathy palpable.  Heart/Lungs:  Normal. The patient no longer has a Port-A-Cath which was removed in mid November 2012.  Abdomen:  Notable for an ileostomy bag in the right mid abdomen, otherwise benign.  In the past, the patient had an indwelling Foley catheter.  I did not take note of this today.  No peripheral edema or clubbing.  LABORATORY DATA:  White count 5.5, ANC 4.4, hemoglobin 10.4, hematocrit 31.4, platelets 320,000.  Chemistries and iron studies today are pending.  Chemistries from 06/10/2011 notable for a BUN of 42, creatinine 1.94 up from 28 and 1.04 respectively on 02/07/2011.  Also of note on 05/17 the BUN was 48, creatinine was 2.42.  On 06/10/2011 vitamin B12 level was 434.  Ferritin was 111 and iron saturation 8%. CEA on 05/07 was 0.9.  TSH on 06/20/2011 was 1.026.  IMAGING STUDIES:  1. PET scan from 11/29/2010 showed no evidence for recurrent or  metastatic cancer.  2. CT scan of abdomen and pelvis without IV contrast on 01/16/2011  showed right perinephric fluid collections. One of these was  opacified, the other was not. It was felt that these collections  could be urinomas or abscesses. The patient was noted to have a  right percutaneous nephrostomy tube with bilateral internal  ureteral stents. Gas  was noted within the collecting system in the  right kidney, suggesting infection.  3. CT-guided drain placement into 2 separate right perinephric fluid  collections carried out on 01/16/2011. Fluid collections  surrounded the right kidney. These fluid collections was  successfully decompressed. 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 Fluid in the collections did not appear to be frankly  infected. Samples were sent for Gram stain and culture.  4. Cystogram carried out on 04/30/2011 showed patent colovesical  fistula arising along the right posterolateral aspect of the  bladder. 5. PET scan from 06/16/2011 showed no evidence of recurrent or     metastatic disease. 6. Renal/urinary tract ultrasound complete from 06/20/2011 showed     normal appearance of the renal parenchyma and no hydronephrosis.     The bladder was decompressed by a Foley catheter.   PROCEDURES:  Cystoscopy and bilateral ureteral catheter stent exchanges carried out by Dr. [name] on 06/19/2011.   IMPRESSION AND PLAN:  Clinically, Eric Munoz is stable.  He saw Dr. Vonita Moss at [facility], a reconstructive urologic surgeon regarding management of his colovesical fistula.  Eric Munoz is still waiting to hear from Dr. Vonita Moss regarding his recommendations.  My concern is the rise in the patient's BUN and creatinine.  The ultrasound did not show any evidence for obstruction.  The patient may need referral to a nephrologist regarding further evaluation of his renal insufficiency which has developed over the past several months.  We are checking iron studies today.  This was added to the patient's labs.  The patient had iron-deficiency back when he presented with his sigmoid colon cancer 2 years ago.  He received intravenous Feraheme in August 2011.  Will plan to see the patient again in 2 months at which time we will check CBC, chemistries, CEA and iron studies.    ______________________________ Samul Dada, M.D. DSM/MEDQ  D:  08/11/2011  T:  08/11/2011  Job:  [id number]

## 2011-10-20 ENCOUNTER — Ambulatory Visit: Payer: BC Managed Care – PPO | Admitting: Oncology

## 2011-10-20 ENCOUNTER — Other Ambulatory Visit: Payer: BC Managed Care – PPO | Admitting: Lab

## 2011-10-22 ENCOUNTER — Other Ambulatory Visit: Payer: Self-pay | Admitting: Urology

## 2011-10-22 ENCOUNTER — Telehealth: Payer: Self-pay | Admitting: Oncology

## 2011-10-22 NOTE — Telephone Encounter (Signed)
Pt r/s appt to October 2013 lab and MD

## 2011-10-23 ENCOUNTER — Encounter (HOSPITAL_BASED_OUTPATIENT_CLINIC_OR_DEPARTMENT_OTHER): Payer: Self-pay | Admitting: *Deleted

## 2011-10-23 NOTE — Progress Notes (Addendum)
REVIEWED CHART FROM EPIC W/ DR CARIGNIN THIS AM AT 0915 (NOTED DIFFICULT AIRWAY FROM PREVIOUS SURGERY DONE AT [facility] MA[facility]). HE WROTE A PRE-ANESTHESIA EVALUATION NOTE FOR DR ROSE , PLACED IN CHART. NPO AFTER MN. ARRIVES AT 1045. NEEDS ISTAT. ?EKG, ASK MDA IF UPDATED NEEDED . I FOUND A PREVIOUS EKG DONE 03-01-2010 AFTER [facility]  RESEARCH  (W/ CHART). NOTED NO EKG 12 LEAD DONE W/ PREVIOUS SURG'S.

## 2011-10-24 ENCOUNTER — Encounter (HOSPITAL_BASED_OUTPATIENT_CLINIC_OR_DEPARTMENT_OTHER)
Admission: RE | Disposition: A | Discharge status: Home / Self Care | Payer: Self-pay | Source: Ambulatory Visit | Attending: Urology

## 2011-10-24 ENCOUNTER — Encounter (HOSPITAL_BASED_OUTPATIENT_CLINIC_OR_DEPARTMENT_OTHER): Payer: Self-pay | Admitting: Anesthesiology

## 2011-10-24 ENCOUNTER — Ambulatory Visit (HOSPITAL_BASED_OUTPATIENT_CLINIC_OR_DEPARTMENT_OTHER)
Admission: RE | Admit: 2011-10-24 | Discharge: 2011-10-24 | Disposition: A | Discharge status: Home / Self Care | Payer: BC Managed Care – PPO | Source: Ambulatory Visit | Attending: Urology | Admitting: Urology

## 2011-10-24 ENCOUNTER — Ambulatory Visit (HOSPITAL_BASED_OUTPATIENT_CLINIC_OR_DEPARTMENT_OTHER): Payer: BC Managed Care – PPO | Admitting: Anesthesiology

## 2011-10-24 ENCOUNTER — Encounter (HOSPITAL_BASED_OUTPATIENT_CLINIC_OR_DEPARTMENT_OTHER): Payer: Self-pay | Admitting: *Deleted

## 2011-10-24 DIAGNOSIS — Z923 Personal history of irradiation: Secondary | ICD-10-CM | POA: Insufficient documentation

## 2011-10-24 DIAGNOSIS — N1330 Unspecified hydronephrosis: Secondary | ICD-10-CM | POA: Insufficient documentation

## 2011-10-24 DIAGNOSIS — N135 Crossing vessel and stricture of ureter without hydronephrosis: Secondary | ICD-10-CM | POA: Insufficient documentation

## 2011-10-24 DIAGNOSIS — Z9221 Personal history of antineoplastic chemotherapy: Secondary | ICD-10-CM | POA: Insufficient documentation

## 2011-10-24 DIAGNOSIS — Z85038 Personal history of other malignant neoplasm of large intestine: Secondary | ICD-10-CM | POA: Insufficient documentation

## 2011-10-24 HISTORY — DX: Personal history of other diseases of urinary system: Z87.448

## 2011-10-24 HISTORY — DX: Presence of other specified devices: Z97.8

## 2011-10-24 HISTORY — DX: Presence of urogenital implants: Z96.0

## 2011-10-24 HISTORY — PX: CYSTOSCOPY W/ URETERAL STENT PLACEMENT: SHX1429

## 2011-10-24 HISTORY — PX: CYSTOSCOPY W/ RETROGRADES: SHX1426

## 2011-10-24 HISTORY — DX: Iron deficiency anemia, unspecified: D50.9

## 2011-10-24 HISTORY — DX: Ileostomy status: Z93.2

## 2011-10-24 HISTORY — DX: Vesicointestinal fistula: N32.1

## 2011-10-24 LAB — POCT I-STAT 4, (NA,K, GLUC, HGB,HCT)
Glucose, Bld: 91 mg/dL (ref 70–99)
Potassium: 4.7 mEq/L (ref 3.5–5.1)

## 2011-10-24 SURGERY — CYSTOSCOPY, FLEXIBLE, WITH STENT REPLACEMENT
Anesthesia: General | Site: Ureter | Laterality: Left | Wound class: Clean Contaminated

## 2011-10-24 MED ORDER — ONDANSETRON HCL 4 MG/2ML IJ SOLN
INTRAMUSCULAR | Status: DC | PRN
  Administered 2011-10-24: 4 mg via INTRAVENOUS

## 2011-10-24 MED ORDER — EPHEDRINE SULFATE 50 MG/ML IJ SOLN
INTRAMUSCULAR | Status: DC | PRN
  Administered 2011-10-24: 10 mg via INTRAVENOUS
  Administered 2011-10-24: 5 mg via INTRAVENOUS

## 2011-10-24 MED ORDER — LACTATED RINGERS IV SOLN: INTRAVENOUS | Status: DC

## 2011-10-24 MED ORDER — FENTANYL CITRATE 0.05 MG/ML IJ SOLN: 25.0000 ug | INTRAMUSCULAR | Status: DC | PRN

## 2011-10-24 MED ORDER — MEPERIDINE HCL 25 MG/ML IJ SOLN: 6.2500 mg | INTRAMUSCULAR | Status: DC | PRN

## 2011-10-24 MED ORDER — LIDOCAINE HCL (CARDIAC) 20 MG/ML IV SOLN
INTRAVENOUS | Status: DC | PRN
  Administered 2011-10-24: 60 mg via INTRAVENOUS

## 2011-10-24 MED ORDER — PROMETHAZINE HCL 25 MG/ML IJ SOLN: 6.2500 mg | INTRAMUSCULAR | Status: DC | PRN

## 2011-10-24 MED ORDER — LACTATED RINGERS IV SOLN
INTRAVENOUS | Status: DC
  Administered 2011-10-24 (×2): via INTRAVENOUS

## 2011-10-24 MED ORDER — FENTANYL CITRATE 0.05 MG/ML IJ SOLN
INTRAMUSCULAR | Status: DC | PRN
  Administered 2011-10-24: 25 ug via INTRAVENOUS
  Administered 2011-10-24: 50 ug via INTRAVENOUS

## 2011-10-24 MED ORDER — STERILE WATER FOR IRRIGATION IR SOLN
Status: DC | PRN
  Administered 2011-10-24: 3000 mL

## 2011-10-24 MED ORDER — CIPROFLOXACIN IN D5W 400 MG/200ML IV SOLN
400.0000 mg | INTRAVENOUS | Status: AC
  Administered 2011-10-24: 400 mg via INTRAVENOUS

## 2011-10-24 MED ORDER — CIPROFLOXACIN HCL 250 MG PO TABS: 250.0000 mg | ORAL_TABLET | Freq: Two times a day (BID) | ORAL | Status: DC

## 2011-10-24 MED ORDER — PROPOFOL 10 MG/ML IV BOLUS
INTRAVENOUS | Status: DC | PRN
  Administered 2011-10-24: 170 mg via INTRAVENOUS

## 2011-10-24 MED ORDER — IOHEXOL 350 MG/ML SOLN
INTRAVENOUS | Status: DC | PRN
  Administered 2011-10-24: 10 mL

## 2011-10-24 SURGICAL SUPPLY — 23 items
ADAPTER CATH URET PLST 4-6FR (CATHETERS) IMPLANT
ADPR CATH URET STRL DISP 4-6FR (CATHETERS)
BAG DRAIN URO-CYSTO SKYTR STRL (DRAIN) ×3 IMPLANT
BAG DRN ANRFLXCHMBR STRAP LEK (BAG) ×2
BAG DRN UROCATH (DRAIN) ×2
BAG URINE LEG 19OZ MD ST LTX (BAG) ×1 IMPLANT
CANISTER SUCT LVC 12 LTR MEDI- (MISCELLANEOUS) ×1 IMPLANT
CATH COUDE FOLEY 2W 5CC 18FR (CATHETERS) ×1 IMPLANT
CATH INTERMIT  6FR 70CM (CATHETERS) IMPLANT
CLOTH BEACON ORANGE TIMEOUT ST (SAFETY) ×3 IMPLANT
DRAPE CAMERA CLOSED 9X96 (DRAPES) ×3 IMPLANT
GLOVE BIO SURGEON STRL SZ8 (GLOVE) ×3 IMPLANT
GLOVE ECLIPSE 6.0 STRL STRAW (GLOVE) ×1 IMPLANT
GLOVE INDICATOR 6.5 STRL GRN (GLOVE) ×2 IMPLANT
GOWN PREVENTION PLUS LG XLONG (DISPOSABLE) ×3 IMPLANT
GOWN STRL REIN XL XLG (GOWN DISPOSABLE) ×3 IMPLANT
GUIDEWIRE 0.038 PTFE COATED (WIRE) IMPLANT
GUIDEWIRE ANG ZIPWIRE 038X150 (WIRE) IMPLANT
GUIDEWIRE STR DUAL SENSOR (WIRE) ×1 IMPLANT
NS IRRIG 500ML POUR BTL (IV SOLUTION) IMPLANT
PACK CYSTOSCOPY (CUSTOM PROCEDURE TRAY) ×3 IMPLANT
STENT CONTOUR 7FRX24 (STENTS) ×2 IMPLANT
STENT CONTOUR 7FRX24X.038 (STENTS) IMPLANT

## 2011-10-24 NOTE — Transfer of Care (Signed)
Immediate Anesthesia Transfer of Care Note  Patient: Eric Munoz  Procedure(s) Performed: Procedure(s) (LRB): CYSTOSCOPY WITH STENT REPLACEMENT (Bilateral) CYSTOSCOPY WITH RETROGRADE PYELOGRAM (Left)  Patient Location: PACU  Anesthesia Type: General  Level of Consciousness: awake, oriented, sedated and patient cooperative  Airway & Oxygen Therapy: Patient Spontanous Breathing and Patient connected to face mask oxygen  Post-op Assessment: Report given to PACU RN and Post -op Vital signs reviewed and stable  Post vital signs: Reviewed and stable  Complications: No apparent anesthesia complications

## 2011-10-24 NOTE — H&P (Signed)
Urology History and Physical Exam  CC: blocked kidney tube  HPI: 71 year old male who presents at this time for bilateral double-J stent exchanges. His history is outlined below:  He had left ureterolysis on 08/07/2009. This was for a large colon cancer that was resected by Dr. Michaell Cowing. He has had a stent in since that time. He is having no difficulty with the stent. The patient underwent attempted colostomy takedown and reanastomosis. Patient did have a low rectal anastomosis, bu at the time of this procedure, there was a small bladder laceration. He also had new onset right hydronephrosis.  The hydronephrosis was treated with a stent that was placed antegrade. 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 That has been treated with Foley catheter drainage. In February, 2013, he underwent cystogram both by conventional means and with CT that revealed no evidence leakage from his bladder. This catheter was removed, but he had passage of urine through the rectum within a day.   On 06/19/2011, he had cystoscopy, double-J stent exchange, and an aborted fistula repair. Following rectal dilatation, he had his fistula open up significantly. Both Dr. Michaell Cowing and I felt that, because of this further fistula repair would best be performed by physicians more adept/experienced.  He has seen Dr. Gershon Mussel as well as a general surgeon at [facility]. He will be scheduled for possible fistula repair/graft interposition in the near future. He does think that he has a urinary tract infection.   Recent BUN and creatinine revealed increased creatinine, and ultrasound revealed moderate hydronephrosis bilaterally. He presents at this time for double-J stent exchange.  PMH: Past Medical History  Diagnosis Date  . Easy bruising   . 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 S/P chemotherapy, time since greater than 12 weeks 03-2010 last  chemo  . S/P radiation > 12 weeks 02-2010    last radiation  . Urinoma, s/p bladder repair 01/20/2011  . Difficult intubation   . History of acute renal failure nov 2012  . Hydronephrosis, bilateral   . 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 Colovesical fistula   . Iron deficiency anemia   . Renal insufficiency   . S/P ileostomy   . Foley catheter in place     PSH: Past Surgical History  Procedure Date  . Portacath placement     09-17-09 right chest/ states removed 12-20-10  . Colostomy takedown 12/20/2010    Procedure: LAPAROSCOPIC COLOSTOMY TAKEDOWN;  Surgeon: [name], MD;  Location: [facility];  Service: General;  Laterality: N/A;  Laparoscopy Colostomy Takedownn With Ileostomy  . Port-a-cath removal 12/20/2010    Procedure: REMOVAL PORT-A-CATH;  Surgeon: [name], MD;  Location: [facility];  Service: General;  Laterality: Right;  Right  venous catheter no longer needed.  . Cystoscopy w/ ureteral stent placement 01/13/2011    Procedure: CYSTOSCOPY WITH RETROGRADE PYELOGRAM/URETERAL STENT PLACEMENT;  Surgeon: [name];  Location: [facility];  Service: Urology;  Laterality: Left;  intra-operative cystogram  . Tonsillectomy yrs ago  . Colon surgery 08/07/09    Low Anterior Rescection, resection of L ext iliac artery,  ureterolysis  . Fracture surgery 1950'S    RIGHT ARM SURGERY FOR FX  . Examination under anesthesia 06/19/2011    Procedure: EXAM UNDER ANESTHESIA;  Surgeon: [name], MD;  Location: [facility];  Service: General;;  . Cystoscopy w/ ureteral stent placement 06/19/2011    Procedure: CYSTOSCOPY WITH RETROGRADE PYELOGRAM/URETERAL STENT PLACEMENT;  Surgeon: [name], MD;  Location: [facility];  Service: Urology;  Laterality: Bilateral;  Cystoscopy/Bilateral Double J Stent insertion , flexible cystoscopy     Allergies: No Known  Allergies  Medications: No prescriptions prior to admission     Social History: History   Social History  . Marital Status: Married    Spouse Name: N/A    Number of Children: N/A  . Years of Education: N/A   Occupational History  . Not on file.   Social History Main Topics  . Smoking status: Former Smoker -- 1.0 packs/day for 10 years    Quit date: 02/03/1974  . Smokeless tobacco: Never Used  . Alcohol Use: No  . Drug Use: No  . Sexually Active: Not on file   Other Topics Concern  . Not on file   Social History Narrative  . No narrative on file    Family History: Family History  Problem Relation Age of Onset  . Stroke Father 67  . Cancer Father     colon  . Lupus Daughter     Review of Systems: Positive: Wt loss, occasional urinary drainage from rectum Negative:   A further 10 point review of systems was negative except what is listed in the HPI.  Physical Exam: @VITALS2 @ General: No acute distress.  Awake. thin Head:  Normocephalic.  Atraumatic. ENT:  EOMI.  Mucous membranes moist Neck:  Supple.  No lymphadenopathy. CV:  S1 present. S2 present. Regular rate. Pulmonary: Equal effort bilaterally.  Clear to auscultation bilaterally. Abdomen: Soft.  Non tender to palpation. Ostomy present. Skin:  Normal turgor.  No visible rash. Extremity: No gross deformity of bilateral upper extremities.  No gross deformity of    bilateral lower extremities. Neurologic: Alert. Appropriate mood.    Studies:  No results found for this basename: HGB:2,WBC:2,PLT:2 in the last 72 hours  No results found for this basename: NA:2,K:2,CL:2,CO2:2,BUN:2,CREATININE:2,CALCIUM:2,MAGNESIUM:2,GFRNONAA:2,GFRAA:2 in the last 72 hours   No results found for this basename: PT:2,INR:2,APTT:2 in the last 72 hours   No components found with this basename: ABG:2    Assessment:  Bilateral hydronephrosis   Plan: Cysto, bilateral J2 stent exchange

## 2011-10-24 NOTE — Anesthesia Preprocedure Evaluation (Addendum)
Anesthesia Evaluation  Patient identified by MRN, date of birth, ID band Patient awake  General Assessment Comment:H/O stage IV colon ca. S/p chemo  Reviewed: Allergy & Precautions, H&P , NPO status , Patient's Chart, lab work & pertinent test results  History of Anesthesia Complications (+) DIFFICULT AIRWAY  Airway Mallampati: III TM Distance: <3 FB Neck ROM: Limited    Dental No notable dental hx.    Pulmonary neg pulmonary ROS,  breath sounds clear to auscultation  Pulmonary exam normal       Cardiovascular negative cardio ROS  Rhythm:Regular Rate:Normal     Neuro/Psych negative neurological ROS  negative psych ROS   GI/Hepatic negative GI ROS, Neg liver ROS,   Endo/Other  negative endocrine ROS  Renal/GU negative Renal ROS  negative genitourinary   Musculoskeletal negative musculoskeletal ROS (+)   Abdominal   Peds negative pediatric ROS (+)  Hematology negative hematology ROS (+)   Anesthesia Other Findings   Reproductive/Obstetrics negative OB ROS                           Anesthesia Physical Anesthesia Plan  ASA: III  Anesthesia Plan:    Post-op Pain Management:    Induction: Intravenous  Airway Management Planned: LMA  Additional Equipment:   Intra-op Plan:   Post-operative Plan:   Informed Consent: I have reviewed the patients History and Physical, chart, labs and discussed the procedure including the risks, benefits and alternatives for the proposed anesthesia with the patient or authorized representative who has indicated his/her understanding and acceptance.   Dental advisory given  Plan Discussed with: CRNA and Surgeon  Anesthesia Plan Comments:         Anesthesia Quick Evaluation

## 2011-10-24 NOTE — Progress Notes (Signed)
Patient does not need an ekg per Dr.Rose

## 2011-10-24 NOTE — Anesthesia Postprocedure Evaluation (Signed)
  Anesthesia Post-op Note  Patient: Eric Munoz  Procedure(s) Performed: Procedure(s) (LRB): CYSTOSCOPY WITH STENT REPLACEMENT (Bilateral) CYSTOSCOPY WITH RETROGRADE PYELOGRAM (Left)  Patient Location: PACU  Anesthesia Type: General  Level of Consciousness: awake and alert   Airway and Oxygen Therapy: Patient Spontanous Breathing  Post-op Pain: mild  Post-op Assessment: Post-op Vital signs reviewed, Patient's Cardiovascular Status Stable, Respiratory Function Stable, Patent Airway and No signs of Nausea or vomiting  Post-op Vital Signs: stable  Complications: No apparent anesthesia complications

## 2011-10-24 NOTE — Anesthesia Procedure Notes (Signed)
Procedure Name: LMA Insertion Date/Time: 10/24/2011 12:29 PM Performed by: Fran Lowes Pre-anesthesia Checklist: Patient identified, Emergency Drugs available, Suction available and Patient being monitored Patient Re-evaluated:Patient Re-evaluated prior to inductionOxygen Delivery Method: Circle System Utilized Preoxygenation: Pre-oxygenation with 100% oxygen Intubation Type: IV induction Ventilation: Mask ventilation without difficulty LMA: LMA inserted LMA Size: 4.0 Number of attempts: 1 Airway Equipment and Method: bite block Placement Confirmation: positive ETCO2 Tube secured with: Tape Dental Injury: Teeth and Oropharynx as per pre-operative assessment

## 2011-10-24 NOTE — Op Note (Signed)
Preoperative diagnosis: Bilateral ureteral strictures  Postoperative diagnosis: Same  Principal procedure: Cystoscopy, bilateral double-J stent exchanges  Surgeon: Doran Nestle  Anesthesia: Gen. with LMA  Complications: None  Stents: Bilateral contour stent, 24 cm I 7 [place] without tether  Indications: 70 year old male with a long history of colon cancer, who now has, because of complications, a rectourethral fistula. He needs bilateral double-J stent for bilateral ureteral strictures. Recent renal ultrasound revealed worsening hydronephrosis and he had some elevation of his creatinine. He presents at this time for that procedure, having tolerated these well and having been informed of these before.  Description of procedure: The patient was properly identified preoperatively and received preoperative IV antibiotics. He was taken the operating room where general anesthetic was administered using the LMA. He was placed in the dorsolithotomy position. Genitalia and perineum were properly prepped and draped. Timeout was then performed.  Cystoscopy was then performed. Urethra was normal except for a posterior fistula in the prostatic urethra. This was approximately 1 cm in size. The scope was navigated and the bladder which was somewhat cloudy due to to the stents and the fistula. No mucosal lesions were seen. The right stent was grasped, brought out through the urethral meatus with the scope, and I fed a guidewire through this up into the right renal pelvis using fluoroscopic guidance. The scope was replaced, and a 24 cm x 7 [place] contour stent without string was placed. Proximal and distal curls were seen fluoroscopically and cystoscopically, respectively.  The same procedure was done exactly as previously dictated with the left stent.  Following left stent placement, the bladder was drained, a 18 French coud-tip catheter was placed, and the patient was awakened and taken to the PACU in stable  condition.n

## 2011-10-24 NOTE — Interval H&P Note (Signed)
History and Physical Interval Note:  10/24/2011 12:24 PM  Eric Munoz  has presented today for surgery, with the diagnosis of Bilateral Hydronephrosis  The various methods of treatment have been discussed with the patient and family. After consideration of risks, benefits and other options for treatment, the patient has consented to  Procedure(s) (LRB) with comments: CYSTOSCOPY WITH STENT REPLACEMENT (Bilateral) - 45 mins requested for this case  C-ARM as a surgical intervention .  The patient's history has been reviewed, patient examined, no change in status, stable for surgery.  I have reviewed the patient's chart and labs.  Questions were answered to the patient's satisfaction.     Chelsea Aus

## 2011-10-24 NOTE — OR Nursing (Signed)
Stents removed from Right and Left ureter during OR procedure. Only the left stent was evident in the epic system to be removed.

## 2011-10-27 ENCOUNTER — Encounter (HOSPITAL_BASED_OUTPATIENT_CLINIC_OR_DEPARTMENT_OTHER): Payer: Self-pay | Admitting: Urology

## 2011-11-04 ENCOUNTER — Encounter: Payer: Self-pay | Admitting: Oncology

## 2011-11-04 ENCOUNTER — Ambulatory Visit (HOSPITAL_BASED_OUTPATIENT_CLINIC_OR_DEPARTMENT_OTHER): Payer: BC Managed Care – PPO | Admitting: Oncology

## 2011-11-04 ENCOUNTER — Other Ambulatory Visit (HOSPITAL_BASED_OUTPATIENT_CLINIC_OR_DEPARTMENT_OTHER): Payer: BC Managed Care – PPO | Admitting: Lab

## 2011-11-04 ENCOUNTER — Telehealth: Payer: Self-pay | Admitting: Medical Oncology

## 2011-11-04 ENCOUNTER — Telehealth: Payer: Self-pay | Admitting: Oncology

## 2011-11-04 VITALS — BP 123/73 | HR 97 | Temp 97.4°F | Resp 20 | Ht 69.0 in | Wt 137.8 lb

## 2011-11-04 DIAGNOSIS — Z85038 Personal history of other malignant neoplasm of large intestine: Secondary | ICD-10-CM

## 2011-11-04 DIAGNOSIS — D539 Nutritional anemia, unspecified: Secondary | ICD-10-CM

## 2011-11-04 DIAGNOSIS — C19 Malignant neoplasm of rectosigmoid junction: Secondary | ICD-10-CM

## 2011-11-04 DIAGNOSIS — C775 Secondary and unspecified malignant neoplasm of intrapelvic lymph nodes: Secondary | ICD-10-CM

## 2011-11-04 LAB — CBC WITH DIFFERENTIAL/PLATELET
Eosinophils Absolute: 0.1 10*3/uL (ref 0.0–0.5)
HCT: 27.8 % — ABNORMAL LOW (ref 38.4–49.9)
LYMPH%: 4.1 % — ABNORMAL LOW (ref 14.0–49.0)
MCV: 90.1 fL (ref 79.3–98.0)
MONO%: 11.2 % (ref 0.0–14.0)
NEUT#: 5.6 10*3/uL (ref 1.5–6.5)
NEUT%: 82.5 % — ABNORMAL HIGH (ref 39.0–75.0)
Platelets: 325 10*3/uL (ref 140–400)
RBC: 3.08 10*6/uL — ABNORMAL LOW (ref 4.20–5.82)

## 2011-11-04 LAB — COMPREHENSIVE METABOLIC PANEL (CC13)
ALT: 17 U/L (ref 0–55)
AST: 12 U/L (ref 5–34)
BUN: 77 mg/dL — ABNORMAL HIGH (ref 7.0–26.0)
Creatinine: 5.1 mg/dL (ref 0.7–1.3)
Total Bilirubin: 0.2 mg/dL (ref 0.20–1.20)

## 2011-11-04 LAB — IRON AND TIBC: %SAT: 4 % — ABNORMAL LOW (ref 20–55)

## 2011-11-04 LAB — CEA: CEA: 1.1 ng/mL (ref 0.0–5.0)

## 2011-11-04 LAB — FERRITIN: Ferritin: 209 ng/mL (ref 22–322)

## 2011-11-04 NOTE — Progress Notes (Signed)
CC:   Eric Sportsman, MD Radene Gunning, M.D., Ph.D. [name], M.D. [name], M.D. [name], M.D.  PROBLEM LIST:  1. 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 Diagnosis she  was established in July of 2011. The patient underwent rather  extensive surgery on both July 5th and August 08, 2009. There were  multiple tumor nodules. Six out of 26 lymph nodes were involved  along with adjacent small bowel. There was involvement of a  periaortic lymph node. There was a close radial margin. There was  tumor thrombus in a vein. There was extracapsular extension,  lymphovascular and perineural invasion, invasion of tumor into the  left external iliac artery and left ureter. KRAS mutation was not  detected, and thus the tumor is KRAS wild type. Surgery involved a  colostomy and placement of a left ureteral stent. There was a  question of whether this represented a de novo cancer or possibly  recurrence from a previous sigmoid well-differentiated  adenocarcinoma that was resected by Dr. [name] on  03/15/2004. This tumor arose apparently in a polyp with 0/3 lymph  nodes and was felt to be T1 stage I. The margin, however, was 0.5  mm. Post surgical treatments consisted of 7 cycles of FOLFOX with  treatments from 06/26/2009 through 12/24/2009. The patient  received pelvic radiation with continuous infusion 5-fluorouracil  from 01/07/2010 through 02/15/2009. The patient then received 3  cycles of 5-FU leucovorin and 5-FU by continuous infusion from  03/04/2010 through 04/01/2010. 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 PET scan carried out on 06/16/2011  showed no evidence of recurrent or metastatic disease.  2. Ileostomy, 12/20/2010.  3. Right leg weakness secondary to femoral nerve injuries following  the patient's surgery in July 2011. Currently resolved.  4. The  patient underwent extensive surgery on December 20, 2010. This  consisted of lysis of adhesions, laparoscopic-assisted splenic  flexure mobilization, laparoscopic colostomy takedown, primary  repair of bladder injury, diverting loop ileostomy in the right  upper quadrant, and removal of the patient's Port-A-Cath.  5. Bilateral ureteral stents, most recently exchanged on 10/24/2011.  6. 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  4. Progressive weight loss.  5. Anemia noted in January 2013 with prior history of iron-deficiency  anemia requiring intravenous Feraheme dating back to August 2011.  6. Development of renal insufficiency noted in May 2013 with acute decompensation noted on 11/04/2011.    MEDICATIONS:  1. Imodium 2 tablets 3 times a day.  2. Boost 1 can daily. The patient has been urged to increase this in  view of his weight loss.  3. Remeron 7.5 mg at bedtime to stimulate appetite.   SMOKING HISTORY:  The patient smoked a pack cigarettes a day for about 15 years.  He stopped smoking in 1976.  HISTORY:  I saw Eric Munoz today for followup of his adenocarcinoma of the rectosigmoid, stage IV, T4b N2a M1a, with carcinoma involving periaortic lymph nodes at the time of surgery in early July 2011.  The patient's tumor is KRAS wild-type.  The patient had a PET scan carried out on 06/16/2011 which showed no evidence for disease.  The patient was last seen by [place] on 08/11/2011.  He tells me that he recently underwent bilateral ureteral stent changed by Dr. [name]. He had an ultrasound in Dr. Lenoria Chime office at the urology center. The patient has been seen at  [facility] by Dr. Vonita Moss.  The patient is scheduled to undergo surgery to correct a colovesical fistula on 01/15/2012.  It will be recalled that this fistula was detected back in December 2012.  The patient denies any significant problems over the last few months since his visit.  He has not had any surgery other  than the stent change nor has he had any hospitalizations.  He denies any obvious blood in his stools.  He occasionally will see some blood in his urine.  He does have a Foley catheter indwelling and connected to closed drainage.  He also has an ileostomy.  He says that over the past 3 or 4 days he has had some low back discomfort.  Otherwise, he denies any pain.  He has variable energy.  Last week, he said he had good energy.  This week, energy is just fair.  He takes in 1 can of Boost per day even though I have recommended he try to increase this.  In short, there are no major symptomatic changes.  He continues to be independent and drives.  PHYSICAL EXAMINATION:  General:  The patient shows no major changes. There is certainly evidence of weight loss.  Vital Signs:  His weight today is 137.7 pounds as compared with 141.5 pounds back 3 months ago. Back a year to a year and a half ago, he was weighing about 163 pounds and his baseline weight in 2010 was 180 pounds.  Height 5 feet 9 inches. Body surface area 1.74 sq m.  Blood pressure 123/73.  Other vital signs are normal.  He is afebrile.  HEENT:  There is no scleral icterus. Mouth and pharynx are benign.  There is no peripheral adenopathy palpable.  Lungs:  Clear to percussion and auscultation.  Cardiac: Regular rhythm without murmur or rub.  He does have a pectus excavatum. He no longer has a Port-A-Cath which was removed in November 2012. Abdomen:  Benign except for an ileostomy bag in the right mid-abdomen The patient still has an indwelling Foley catheter with closed drainage.  Extremities:  No peripheral edema or clubbing.  Neurologic: Normal.  LABORATORY DATA:  Today, white count 6.8, ANC 5.6, hemoglobin 9.1, hematocrit 27.8, platelets 325,000.  Chemistries today are notable for a BUN of 77, creatinine 5.1, albumin 3.1.  LDH, CEA, and iron studies are pending.  On 08/11/2011 ferritin was 147, iron was less than 10.  Also on  08/11/2011 BUN was 35, creatinine was 1.83.  At that time, albumin was 3.6, LDH was 67.  CEA on 08/11/2011 was 0.8.  Vitamin B12 on 06/10/2011 was 434.  IMAGING STUDIES:  1. PET scan from 11/29/2010 showed no evidence for recurrent or  metastatic cancer.  2. CT scan of abdomen and pelvis without IV contrast on 01/16/2011  showed right perinephric fluid collections. One of these was  opacified, the other was not. It was felt that these collections  could be urinomas or abscesses. The patient was noted to have a  right percutaneous nephrostomy tube with bilateral internal  ureteral stents. Gas was noted within the collecting system in the  right kidney, suggesting infection.  3. CT-guided drain placement into 2 separate right perinephric fluid  collections carried out on 01/16/2011. Fluid collections  surrounded the right kidney. These fluid collections was  successfully decompressed. 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 Fluid in the collections did not appear to be frankly  infected. Samples were sent for Gram stain and culture.  4. Cystogram  carried out on 04/30/2011 showed patent colovesical  fistula arising along the right posterolateral aspect of the  bladder.  5. PET scan from 06/16/2011 showed no evidence of recurrent or  metastatic disease.  6. Renal/urinary tract ultrasound complete from 06/20/2011 showed  normal appearance of the renal parenchyma and no hydronephrosis.  The bladder was decompressed by a Foley catheter.    PROCEDURES: Cystoscopy and bilateral ureteral catheter stent exchanges  carried out by Dr. [name] on 06/19/2011 and 10/24/2011.   IMPRESSION AND PLAN:  The patient's BUN and creatinine today came back markedly elevated at 77.0 and 5.1 respectively.  That probably explains the drop in his hemoglobin and hematocrit down to 9.1 and 27.8 respectively from 10.4 and 31.4 on 08/11/2011.  The patient also has lost a couple of pounds.  He does  not look or act acutely ill.  We will be in touch with Dr. [name].  I am concerned that the patient may have urinary obstruction to explain the deterioration of his renal function.  Plans are underway for the patient to have surgery at [facility] to correct his colovesical fistula.  As stated, a PET scan was last carried out on 06/16/2011 and showed no evidence for recurrent or metastatic disease. It would be my recommendation that additional scanning be carried out prior to his surgery planned for December.  Apparently, some scans were carried out at [facility] within the last couple of months by Dr. Vonita Moss.  We have set the patient up for return appointment to see me in 3 months which should be early January at which time we will check CBC and chemistries including an LDH, CEA, and iron studies.  As stated, we will be in touch with Dr. [name] regarding the patient's markedly elevated BUN and creatinine.    ______________________________ Samul Dada, M.D. DSM/MEDQ  D:  11/04/2011  T:  11/04/2011  Job:  [id number]

## 2011-11-04 NOTE — Telephone Encounter (Signed)
I called pt and left a message on his home phone and his cell phone regarding his BUN and Creatinine. I explained that after he left the office Dr. Arline Asp got the results and they are very elevated. He is very concerned and this is probably the reason for the back pain. Dr. Arline Asp called Dr. [name] and spoke with him. Dr. [name] is going to have IR set up an appointment to place a percutaneous nephrostomy. He also is calling in HCO3 for the pt.

## 2011-11-04 NOTE — Telephone Encounter (Signed)
gv and printed appt for pt...the patient needed late Jan. appt due to surgery at the end of Dec...sed

## 2011-11-04 NOTE — Progress Notes (Signed)
This office note has been dictated.  #[id number]

## 2011-11-13 ENCOUNTER — Other Ambulatory Visit: Payer: Self-pay | Admitting: Urology

## 2011-11-13 ENCOUNTER — Other Ambulatory Visit: Payer: Self-pay | Admitting: Radiology

## 2011-11-13 DIAGNOSIS — N1330 Unspecified hydronephrosis: Secondary | ICD-10-CM

## 2011-11-14 ENCOUNTER — Ambulatory Visit (HOSPITAL_COMMUNITY)
Admission: RE | Admit: 2011-11-14 | Discharge: 2011-11-14 | Disposition: A | Discharge status: Home / Self Care | Payer: BC Managed Care – PPO | Source: Ambulatory Visit | Attending: Urology | Admitting: Urology

## 2011-11-14 ENCOUNTER — Encounter (HOSPITAL_COMMUNITY): Payer: Self-pay

## 2011-11-14 DIAGNOSIS — N1330 Unspecified hydronephrosis: Secondary | ICD-10-CM

## 2011-11-14 DIAGNOSIS — Z433 Encounter for attention to colostomy: Secondary | ICD-10-CM | POA: Insufficient documentation

## 2011-11-14 DIAGNOSIS — D509 Iron deficiency anemia, unspecified: Secondary | ICD-10-CM | POA: Insufficient documentation

## 2011-11-14 DIAGNOSIS — C189 Malignant neoplasm of colon, unspecified: Secondary | ICD-10-CM | POA: Insufficient documentation

## 2011-11-14 LAB — CBC
Hemoglobin: 9.6 g/dL — ABNORMAL LOW (ref 13.0–17.0)
MCH: 28.8 pg (ref 26.0–34.0)
MCV: 87.4 fL (ref 78.0–100.0)
Platelets: 405 10*3/uL — ABNORMAL HIGH (ref 150–400)
RBC: 3.33 MIL/uL — ABNORMAL LOW (ref 4.22–5.81)
WBC: 7.8 10*3/uL (ref 4.0–10.5)

## 2011-11-14 LAB — BASIC METABOLIC PANEL
CO2: 13 mEq/L — ABNORMAL LOW (ref 19–32)
Calcium: 10.1 mg/dL (ref 8.4–10.5)
Chloride: 112 mEq/L (ref 96–112)
Glucose, Bld: 81 mg/dL (ref 70–99)
Potassium: 5 mEq/L (ref 3.5–5.1)
Sodium: 139 mEq/L (ref 135–145)

## 2011-11-14 LAB — APTT: aPTT: 42 seconds — ABNORMAL HIGH (ref 24–37)

## 2011-11-14 MED ORDER — SODIUM CHLORIDE 0.9 % IV SOLN
INTRAVENOUS | Status: DC
  Administered 2011-11-14: 11:00:00 via INTRAVENOUS

## 2011-11-14 MED ORDER — CIPROFLOXACIN IN D5W 400 MG/200ML IV SOLN
400.0000 mg | INTRAVENOUS | Status: DC
  Filled 2011-11-14: qty 200

## 2011-11-14 NOTE — H&P (Signed)
Chief Complaint: Elevated creatinine and (B)hydronephrosis Referring Physician:Dahlstedt HPI: Eric Munoz is an 71 y.o. 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 He needs bilateral double-J stent for bilateral ureteral strictures. Recent renal ultrasound revealed worsening hydronephrosis and he had some elevation of his creatinine. He underwent (B) JJ stents a few weeks ago. The pt states he has had good UOP from his foley and feels ok. He did have some recent lab work that showed his Cr to be markedly elevated. There is concern that the stents aren;t working and he is referred for (B)PCN. He has not had any recent imaging.   Past Medical History:  Past Medical History  Diagnosis Date  . Easy bruising   . 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 S/P chemotherapy, time since greater than 12 weeks 03-2010 last chemo  . S/P radiation > 12 weeks 02-2010    last radiation  . Urinoma, s/p bladder repair 01/20/2011  . Difficult intubation   . History of acute renal failure nov 2012  . Hydronephrosis, bilateral   . Colon cancer 2007, recurrent Jul2011    Recurrent, Stage 4, [id number] (Left colectomy/LAR , ChemoTx completed Feb2012/ XRT completed Jan2012  . Colovesical fistula   . Iron deficiency anemia   . Renal insufficiency   . S/P ileostomy   . Foley catheter in place     Past Surgical History:  Past Surgical History  Procedure Date  . Portacath placement     09-17-09 right chest/ states removed 12-20-10  . Colostomy takedown 12/20/2010    Procedure: LAPAROSCOPIC COLOSTOMY TAKEDOWN;  Surgeon: [name], MD;  Location: [facility];  Service: General;  Laterality: N/A;  Laparoscopy Colostomy Takedownn With Ileostomy  . Port-a-cath removal 12/20/2010    Procedure: REMOVAL PORT-A-CATH;  Surgeon: [name], MD;  Location: [facility];  Service: General;  Laterality: Right;   Right  venous catheter no longer needed.  . Cystoscopy w/ ureteral stent placement 01/13/2011    Procedure: CYSTOSCOPY WITH RETROGRADE PYELOGRAM/URETERAL STENT PLACEMENT;  Surgeon: [name];  Location: [facility];  Service: Urology;  Laterality: Left;  intra-operative cystogram  . Tonsillectomy yrs ago  . Colon surgery 08/07/09    Low Anterior Rescection, resection of L ext iliac artery,  ureterolysis  . Fracture surgery 1950'S    RIGHT ARM SURGERY FOR FX  . Examination under anesthesia 06/19/2011    Procedure: EXAM UNDER ANESTHESIA;  Surgeon: [name], MD;  Location: [facility];  Service: General;;  . Cystoscopy w/ ureteral stent placement 06/19/2011    Procedure: CYSTOSCOPY WITH RETROGRADE PYELOGRAM/URETERAL STENT PLACEMENT;  Surgeon: [name], MD;  Location: [facility];  Service: Urology;  Laterality: Bilateral;  Cystoscopy/Bilateral Double J Stent insertion , flexible cystoscopy   . Cystoscopy w/ ureteral stent placement 10/24/2011    Procedure: CYSTOSCOPY WITH STENT REPLACEMENT;  Surgeon: [name], MD;  Location: [facility];  Service: Urology;  Laterality: Bilateral;  45 mins requested for this case  C-ARM  . Cystoscopy w/ retrogrades 10/24/2011    Procedure: CYSTOSCOPY WITH RETROGRADE PYELOGRAM;  Surgeon: [name], MD;  Location: [facility];  Service: Urology;  Laterality: Left;    Family History:  Family History  Problem Relation Age of Onset  . Stroke Father 63  . Cancer Father     colon  . Lupus Daughter  Social History:  reports that he quit smoking about 37 years ago. He has never used smokeless tobacco. He reports that he does not drink alcohol or use illicit drugs.  Allergies: No Known Allergies  Medications: Motrin prn, MVI daily  Please HPI for pertinent positives, otherwise complete 10 system ROS negative.  Physical Exam: Blood pressure 119/78, pulse 88, temperature 97.9 F (36.6 C), temperature source  Oral, resp. rate 18, height 5\' 9"  (1.753 m), weight 135 lb (61.236 kg), SpO2 100.00%. Body mass index is 19.94 kg/(m^2).   General Appearance:  Alert, cooperative, no distress, appears stated age  Head:  Normocephalic, without obvious abnormality, atraumatic  ENT: Unremarkable  Neck: Supple, symmetrical, trachea midline, no adenopathy, thyroid: not enlarged, symmetric, no tenderness/mass/nodules  Lungs:   Clear to auscultation bilaterally, no w/r/r, respirations unlabored without use of accessory muscles.  Heart:  Regular rate and rhythm, S1, S2 normal, no murmur, rub or gallop. Carotids 2+ without bruit.  Abdomen:   Soft, non-tender, non distended. (R) ileostomy intact, no hernia  Extremities: Extremities normal, atraumatic, no cyanosis or edema  Neurologic: Normal affect, no gross deficits.   Results for orders placed during the hospital encounter of 11/14/11 (from the past 48 hour(s))  APTT     Status: Abnormal   Collection Time   11/14/11 10:15 AM      Component Value Range Comment   aPTT 42 (*) 24 - 37 seconds   BASIC METABOLIC PANEL     Status: Abnormal   Collection Time   11/14/11 10:15 AM      Component Value Range Comment   Sodium 139  135 - 145 mEq/L    Potassium 5.0  3.5 - 5.1 mEq/L    Chloride 112  96 - 112 mEq/L    CO2 13 (*) 19 - 32 mEq/L    Glucose, Bld 81  70 - 99 mg/dL    BUN 88 (*) 6 - 23 mg/dL    Creatinine, Ser 1.61 (*) 0.50 - 1.35 mg/dL    Calcium 09.6  8.4 - 10.5 mg/dL    GFR calc non Af Amer 13 (*) >90 mL/min    GFR calc Af Amer 15 (*) >90 mL/min   CBC     Status: Abnormal   Collection Time   11/14/11 10:15 AM      Component Value Range Comment   WBC 7.8  4.0 - 10.5 K/uL    RBC 3.33 (*) 4.22 - 5.81 MIL/uL    Hemoglobin 9.6 (*) 13.0 - 17.0 g/dL    HCT 04.5 (*) 40.9 - 52.0 %    MCV 87.4  78.0 - 100.0 fL    MCH 28.8  26.0 - 34.0 pg    MCHC 33.0  30.0 - 36.0 g/dL    RDW 81.1 (*) 91.4 - 15.5 %    Platelets 405 (*) 150 - 400 K/uL   PROTIME-INR      Status: Normal   Collection Time   11/14/11 10:15 AM      Component Value Range Comment   Prothrombin Time 14.1  11.6 - 15.2 seconds    INR 1.10  0.00 - 1.49    No results found.  Assessment/Plan (B)ureteral strictures s/p JJ stents Elevated CR, though it is down to 4.1 today Discussed possible need for PCN, will [place] for evidence of hydronephrosis and if significant hydronephrosis is seen, then we will proceed with PCN. Labs reviewed, ok Consent signed in chart  Brayton El PA-C 11/14/2011, 11:03 AM

## 2011-11-14 NOTE — Progress Notes (Signed)
Ambulated to BR with minimal assist to empty foley and colostomy Pt states there was about "1/2 cup in foley bag" prior to going to radiology for nephrostomy tube

## 2011-11-14 NOTE — Progress Notes (Signed)
Nephrostomy procedure is cancelled By Dr Miles Costain after discussion with Dr Alma Friendly.  Patient does not have hydronephrosis at this time.  Dr Miles Costain advised the patient to follow up with Dr Alma Friendly.  The IV was removed and the patient was discharged by the IR staff.

## 2011-11-14 NOTE — H&P (Signed)
Repeat Cr 4.1 today from 5.1.  Limited US today shows no significant Hydronephrosis on either side. D/W Dahlstedt and no indication for neph tube placement.    F/u with urology and nephrology referral next week  Discussed with patient and family.  Stable to D/C

## 2011-12-09 ENCOUNTER — Encounter: Payer: Self-pay | Admitting: Nephrology

## 2011-12-21 ENCOUNTER — Emergency Department (HOSPITAL_COMMUNITY): Payer: BC Managed Care – PPO

## 2011-12-21 ENCOUNTER — Encounter (HOSPITAL_COMMUNITY): Payer: Self-pay | Admitting: *Deleted

## 2011-12-21 ENCOUNTER — Inpatient Hospital Stay (HOSPITAL_COMMUNITY)
Admission: EM | Admit: 2011-12-21 | Discharge: 2011-12-22 | DRG: 820 | Disposition: A | Discharge status: Home / Self Care | Payer: BC Managed Care – PPO | Attending: Emergency Medicine | Admitting: Emergency Medicine

## 2011-12-21 DIAGNOSIS — N189 Chronic kidney disease, unspecified: Secondary | ICD-10-CM | POA: Diagnosis present

## 2011-12-21 DIAGNOSIS — N390 Urinary tract infection, site not specified: Secondary | ICD-10-CM | POA: Diagnosis present

## 2011-12-21 DIAGNOSIS — N289 Disorder of kidney and ureter, unspecified: Secondary | ICD-10-CM | POA: Diagnosis present

## 2011-12-21 DIAGNOSIS — Y846 Urinary catheterization as the cause of abnormal reaction of the patient, or of later complication, without mention of misadventure at the time of the procedure: Secondary | ICD-10-CM | POA: Diagnosis present

## 2011-12-21 DIAGNOSIS — Z23 Encounter for immunization: Secondary | ICD-10-CM

## 2011-12-21 DIAGNOSIS — Y9210 Unspecified residential institution as the place of occurrence of the external cause: Secondary | ICD-10-CM | POA: Diagnosis present

## 2011-12-21 DIAGNOSIS — R000 Tachycardia, unspecified: Secondary | ICD-10-CM | POA: Diagnosis present

## 2011-12-21 DIAGNOSIS — Z9089 Acquired absence of other organs: Secondary | ICD-10-CM

## 2011-12-21 DIAGNOSIS — Z9049 Acquired absence of other specified parts of digestive tract: Secondary | ICD-10-CM

## 2011-12-21 DIAGNOSIS — N321 Vesicointestinal fistula: Secondary | ICD-10-CM | POA: Diagnosis present

## 2011-12-21 DIAGNOSIS — N5089 Other specified disorders of the male genital organs: Secondary | ICD-10-CM

## 2011-12-21 DIAGNOSIS — Z79899 Other long term (current) drug therapy: Secondary | ICD-10-CM

## 2011-12-21 DIAGNOSIS — Z85038 Personal history of other malignant neoplasm of large intestine: Secondary | ICD-10-CM

## 2011-12-21 DIAGNOSIS — Z9221 Personal history of antineoplastic chemotherapy: Secondary | ICD-10-CM

## 2011-12-21 DIAGNOSIS — N360 Urethral fistula: Secondary | ICD-10-CM

## 2011-12-21 DIAGNOSIS — T83511A Infection and inflammatory reaction due to indwelling urethral catheter, initial encounter: Principal | ICD-10-CM | POA: Diagnosis present

## 2011-12-21 LAB — URINALYSIS, MICROSCOPIC ONLY
Glucose, UA: NEGATIVE mg/dL
Protein, ur: >300 mg/dL — AB
Specific Gravity, Urine: 1.011 (ref 1.005–1.030)
Urobilinogen, UA: 0.2 mg/dL (ref 0.0–1.0)

## 2011-12-21 LAB — CBC WITH DIFFERENTIAL/PLATELET
Basophils Absolute: 0 10*3/uL (ref 0.0–0.1)
Basophils Relative: 0 % (ref 0–1)
Eosinophils Absolute: 0.1 10*3/uL (ref 0.0–0.7)
HCT: 29.8 % — ABNORMAL LOW (ref 39.0–52.0)
Hemoglobin: 10.1 g/dL — ABNORMAL LOW (ref 13.0–17.0)
MCH: 30.6 pg (ref 26.0–34.0)
MCHC: 33.9 g/dL (ref 30.0–36.0)
Monocytes Absolute: 0.5 10*3/uL (ref 0.1–1.0)
Monocytes Relative: 8 % (ref 3–12)
RDW: 16.5 % — ABNORMAL HIGH (ref 11.5–15.5)

## 2011-12-21 LAB — BASIC METABOLIC PANEL
BUN: 51 mg/dL — ABNORMAL HIGH (ref 6–23)
Calcium: 9.7 mg/dL (ref 8.4–10.5)
Creatinine, Ser: 2.61 mg/dL — ABNORMAL HIGH (ref 0.50–1.35)
GFR calc Af Amer: 27 mL/min — ABNORMAL LOW (ref >90)
GFR calc non Af Amer: 23 mL/min — ABNORMAL LOW (ref >90)

## 2011-12-21 MED ORDER — PNEUMOCOCCAL VAC POLYVALENT 25 MCG/0.5ML IJ INJ
0.5000 mL | INJECTION | INTRAMUSCULAR | Status: DC
  Filled 2011-12-21: qty 0.5

## 2011-12-21 MED ORDER — ONDANSETRON HCL 4 MG/2ML IJ SOLN: 4.0000 mg | Freq: Three times a day (TID) | INTRAMUSCULAR | Status: AC | PRN

## 2011-12-21 MED ORDER — SODIUM CHLORIDE 0.9 % IV BOLUS (SEPSIS): 1000.0000 mL | Freq: Once | INTRAVENOUS | Status: DC

## 2011-12-21 MED ORDER — HEPARIN SODIUM (PORCINE) 5000 UNIT/ML IJ SOLN
5000.0000 [IU] | Freq: Three times a day (TID) | INTRAMUSCULAR | Status: DC
  Administered 2011-12-21 – 2011-12-22 (×3): 5000 [IU] via SUBCUTANEOUS
  Filled 2011-12-21 (×5): qty 1

## 2011-12-21 MED ORDER — SODIUM CHLORIDE 0.9 % IV BOLUS (SEPSIS)
1000.0000 mL | Freq: Once | INTRAVENOUS | Status: AC
  Administered 2011-12-21: 1000 mL via INTRAVENOUS

## 2011-12-21 MED ORDER — DEXTROSE 5 % IV SOLN
1.0000 g | Freq: Once | INTRAVENOUS | Status: AC
  Administered 2011-12-21: 1 g via INTRAVENOUS
  Filled 2011-12-21: qty 10

## 2011-12-21 MED ORDER — DEXTROSE 5 % IV SOLN
1.0000 g | INTRAVENOUS | Status: DC
  Filled 2011-12-21: qty 10

## 2011-12-21 MED ORDER — BOOST HIGH PROTEIN PO LIQD: 1.0000 | ORAL | Status: DC

## 2011-12-21 MED ORDER — BOOST PLUS PO LIQD
237.0000 mL | Freq: Every day | ORAL | Status: DC
  Administered 2011-12-21: 237 mL via ORAL
  Filled 2011-12-21 (×2): qty 237

## 2011-12-21 MED ORDER — SODIUM CHLORIDE 0.9 % IV SOLN
INTRAVENOUS | Status: AC
  Administered 2011-12-21: 23:00:00 via INTRAVENOUS

## 2011-12-21 MED ORDER — SODIUM CHLORIDE 0.9 % IJ SOLN: 3.0000 mL | Freq: Two times a day (BID) | INTRAMUSCULAR | Status: DC

## 2011-12-21 MED ORDER — INFLUENZA VIRUS VACC SPLIT PF IM SUSP
0.5000 mL | INTRAMUSCULAR | Status: AC
  Administered 2011-12-22: 0.5 mL via INTRAMUSCULAR
  Filled 2011-12-21: qty 0.5

## 2011-12-21 MED ORDER — ACETAMINOPHEN 325 MG PO TABS
650.0000 mg | ORAL_TABLET | Freq: Four times a day (QID) | ORAL | Status: DC | PRN
  Administered 2011-12-21 – 2011-12-22 (×2): 650 mg via ORAL
  Filled 2011-12-21 (×2): qty 2

## 2011-12-21 NOTE — ED Notes (Signed)
Report called to floor RN

## 2011-12-21 NOTE — ED Notes (Signed)
Patient transported to CT 

## 2011-12-21 NOTE — ED Notes (Signed)
IV team paged.  

## 2011-12-21 NOTE — Progress Notes (Signed)
Report from [facility] in ED, pt now has T 102. Writer will call hospitalist and obtain order for Tylenol.   Spoke with Dr. Julian Reil who states pt may require stepdown bed until fever resolves. Pt already in transport to floor, will assess and transfer if necessary.   Order to DC to additional liters of NS as pt has already received 2 Liters. Continue NS at 100cc/hr per Dr. Julian Reil.

## 2011-12-21 NOTE — Consult Note (Signed)
Urology Consult  Referring physician: [facility] Reason for referral: Malpositioned Foley catheter. Patient underwent bedside flexible cystoscopy with difficult Foley catheter insertion.  History of Present Illness: Eric Munoz has a very complicated history. He has had a fairly long-standing GU/GI fistula and is currently scheduled for a combined urologic reconstructive and Gen. surgery or suture at [facility]. The patient currently is managed with an indwelling Foley catheter which was change fairly recently in our office. The patient had spontaneous loss of his Foley catheter presumptively due to a balloon malfunction today. He presented to the emergency room for reinsertion. A new coud catheter was placed but there was question about the positioning. The patient underwent pelvic CT with contrast which clearly showed the Foley and balloon to be in the colon rather than the bladder. That catheter was removed and we are asked to consult and 8 and replacement of this catheter.  Past Medical History  Diagnosis Date  . Easy bruising   . 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 S/P chemotherapy, time since greater than 12 weeks 03-2010 last chemo  . S/P radiation > 12 weeks 02-2010    last radiation  . Urinoma, s/p bladder repair 01/20/2011  . Difficult intubation   . History of acute renal failure nov 2012  . Hydronephrosis, bilateral   . Colon cancer 2007, recurrent Jul2011    Recurrent, Stage 4, [id number] (Left colectomy/LAR , ChemoTx completed Feb2012/ XRT completed Jan2012  . Colovesical fistula   . Iron deficiency anemia   . Renal insufficiency   . S/P ileostomy   . Foley catheter in place    Past Surgical History  Procedure Date  . Portacath placement     09-17-09 right chest/ states removed 12-20-10  . Colostomy takedown 12/20/2010    Procedure: LAPAROSCOPIC COLOSTOMY TAKEDOWN;  Surgeon: [name], MD;  [facility]: [facility];  Service: General;  Laterality: N/A;  Laparoscopy Colostomy Takedownn With Ileostomy  . Port-a-cath removal 12/20/2010    Procedure: REMOVAL PORT-A-CATH;  Surgeon: [name], MD;  [facility]: [facility];  Service: General;  Laterality: Right;  Right  venous catheter no longer needed.  . Cystoscopy w/ ureteral stent placement 01/13/2011    Procedure: CYSTOSCOPY WITH RETROGRADE PYELOGRAM/URETERAL STENT PLACEMENT;  Surgeon: [name];  Location: [facility];  Service: Urology;  Laterality: Left;  intra-operative cystogram  . Tonsillectomy yrs ago  . Colon surgery 08/07/09    Low Anterior Rescection, resection of L ext iliac artery,  ureterolysis  . Fracture surgery 1950'S    RIGHT ARM SURGERY FOR FX  . Examination under anesthesia 06/19/2011    Procedure: EXAM UNDER ANESTHESIA;  Surgeon: [name], MD;  Location: [facility];  Service: General;;  . Cystoscopy w/ ureteral stent placement 06/19/2011    Procedure: CYSTOSCOPY WITH RETROGRADE PYELOGRAM/URETERAL STENT PLACEMENT;  Surgeon: [name], MD;  Location: [facility];  Service: Urology;  Laterality: Bilateral;  Cystoscopy/Bilateral Double J Stent insertion , flexible cystoscopy   . Cystoscopy w/ ureteral stent placement 10/24/2011    Procedure: CYSTOSCOPY WITH STENT REPLACEMENT;  Surgeon: [name], MD;  Location: [facility];  Service: Urology;  Laterality: Bilateral;  45 mins requested for this case  C-ARM  . Cystoscopy w/ retrogrades 10/24/2011    Procedure: CYSTOSCOPY WITH RETROGRADE PYELOGRAM;  Surgeon: [name], MD;  Location: [facility];  Service: Urology;  Laterality: Left;    Medications: Prior  to Admission:  (Not in a hospital admission)  Allergies: No Known Allergies  Family History  Problem Relation Age of Onset  . Stroke Father 27  . Cancer Father     colon  . Lupus Daughter     Social History:  reports that he quit smoking about 37 years ago. He has never used  smokeless tobacco. He reports that he does not drink alcohol or use illicit drugs.  Patient complains of mild pelvic pain/pressure with urge to void. He has had no obvious fever, chills, severe abdominal pain, recent gross hematuria.  Physical Exam:  Vital signs in last 24 hours: Temp:  [98 F (36.7 C)] 98 F (36.7 C) (11/17 1556) Pulse Rate:  [120-121] 121  (11/17 1747) Resp:  [16-18] 18  (11/17 1747) BP: (123-156)/(75-85) 156/85 mmHg (11/17 1747) SpO2:  [100 %] 100 % (11/17 1747)  Constitutional: Vital signs reviewed. WD WN in NAD Head: Normocephalic and atraumatic   Eyes: PERRL, No scleral icterus.  Neck: Supple No  Gross JVD, mass, thyromegaly, or carotid bruit present.  Cardiovascular: RRR tachycardia Pulmonary/Chest: Normal effort Abdominal: Soft. Non-tender, non-distended, bowel sounds are normal, no masses, organomegaly, or guarding present.  Genitourinary: Foley catheter currently out. No active bleeding per urethra. Extremities: No cyanosis or edema  Neurological: Grossly non-focal.  Skin: Warm,very dry and intact. No rash, cyanosis   Laboratory Data:  Results for orders placed during the hospital encounter of 12/21/11 (from the past 72 hour(s))  CBC WITH DIFFERENTIAL     Status: Abnormal   Collection Time   12/21/11  6:50 PM      Component Value Range Comment   WBC 6.9  4.0 - 10.5 K/uL    RBC 3.30 (*) 4.22 - 5.81 MIL/uL    Hemoglobin 10.1 (*) 13.0 - 17.0 g/dL    HCT 40.9 (*) 81.1 - 52.0 %    MCV 90.3  78.0 - 100.0 fL    MCH 30.6  26.0 - 34.0 pg    MCHC 33.9  30.0 - 36.0 g/dL    RDW 91.4 (*) 78.2 - 15.5 %    Platelets 310  150 - 400 K/uL    Neutrophils Relative 86 (*) 43 - 77 %    Neutro Abs 6.0  1.7 - 7.7 K/uL    Lymphocytes Relative 5 (*) 12 - 46 %    Lymphs Abs 0.3 (*) 0.7 - 4.0 K/uL    Monocytes Relative 8  3 - 12 %    Monocytes Absolute 0.5  0.1 - 1.0 K/uL    Eosinophils Relative 1  0 - 5 %    Eosinophils Absolute 0.1  0.0 - 0.7 K/uL    Basophils  Relative 0  0 - 1 %    Basophils Absolute 0.0  0.0 - 0.1 K/uL   BASIC METABOLIC PANEL     Status: Abnormal   Collection Time   12/21/11  6:50 PM      Component Value Range Comment   Sodium 137  135 - 145 mEq/L    Potassium 4.4  3.5 - 5.1 mEq/L    Chloride 106  96 - 112 mEq/L    CO2 16 (*) 19 - 32 mEq/L    Glucose, Bld 98  70 - 99 mg/dL    BUN 51 (*) 6 - 23 mg/dL    Creatinine, Ser 9.56 (*) 0.50 - 1.35 mg/dL    Calcium 9.7  8.4 - 21.3 mg/dL    GFR calc non Af  Amer 23 (*) >90 mL/min    GFR calc Af Amer 27 (*) >90 mL/min    No results found for this or any previous visit (from the past 240 hour(s)). Creatinine:  Basename 12/21/11 1850  CREATININE 2.61*   Baseline Creatinine:   Impression/Assessment:  GU-GI fistula. Foley catheter was malpositioned and removed. Flexible cystoscopy was performed at the bedside. Visualization within the prostatic urethral/proximal ureter was difficult due to inflammation. I was able to navigate the cystoscope into the bladder which appeared to be moderately distended. A guidewire was placed through the cystoscope into the bladder an 18 French council-tip Foley was then inserted with drainage of 3-400 cc of cloudy urine.  Plan:  Outpatient management. Catheter now appears to be well positioned and there should be no significant change in his overall short term plan as previously established.  Baron Sane, MD  Barron Alvine S 12/21/2011, 7:43 PM

## 2011-12-21 NOTE — ED Notes (Signed)
Ok to hold fluids per MD until catheter is placed.

## 2011-12-21 NOTE — ED Notes (Signed)
Pt has hx of colon cancer, has colostomy. Pt has fistula between bladder and penis, pt is having surgery next month. Pt has had foley for 1 year. This morning around 1100 pt emptied foley . At 1400 pt went to empty foley again and foley had come out. Balloon is deflated at present. Pt reports scant blood. Penile pain 2/10.

## 2011-12-21 NOTE — ED Provider Notes (Signed)
History     CSN: [id number]  Arrival date & time 12/21/11  1545   First MD Initiated Contact with Patient 12/21/11 1609      Chief Complaint  Patient presents with  . foley came out     (Consider location/radiation/quality/duration/timing/severity/associated sxs/prior treatment) HPI Comments: Patient has a history of colon cancer and has indwelling Foley catheter that fell out today while he was walking. He states he's had a catheter for the better part of the year that is changed monthly by Dr. Truddie Coco stat. He denies any abdominal pain, nausea, vomiting, fever, chills, testicle pain. He has an ileostomy from his previous surgery and states that he has a fistula between his bladder and his colon. Denies any back pain, abdominal pain, chest pain or shortness of breath  The history is provided by the patient.    Past Medical History  Diagnosis Date  . Easy bruising   . 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 S/P chemotherapy, time since greater than 12 weeks 03-2010 last chemo  . S/P radiation > 12 weeks 02-2010    last radiation  . Urinoma, s/p bladder repair 01/20/2011  . Difficult intubation   . History of acute renal failure nov 2012  . Hydronephrosis, bilateral   . 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 Colovesical fistula   . Iron deficiency anemia   . Renal insufficiency   . S/P ileostomy   . Foley catheter in place     Past Surgical History  Procedure Date  . Portacath placement     09-17-09 right chest/ states removed 12-20-10  . Colostomy takedown 12/20/2010    Procedure: LAPAROSCOPIC COLOSTOMY TAKEDOWN;  Surgeon: [name], MD;  Location: [facility];  Service: General;  Laterality: N/A;  Laparoscopy Colostomy Takedownn With Ileostomy  . Port-a-cath removal 12/20/2010    Procedure: REMOVAL PORT-A-CATH;   Surgeon: [name], MD;  Location: [facility];  Service: General;  Laterality: Right;  Right  venous catheter no longer needed.  . Cystoscopy w/ ureteral stent placement 01/13/2011    Procedure: CYSTOSCOPY WITH RETROGRADE PYELOGRAM/URETERAL STENT PLACEMENT;  Surgeon: [name];  Location: [facility];  Service: Urology;  Laterality: Left;  intra-operative cystogram  . Tonsillectomy yrs ago  . Colon surgery 08/07/09    Low Anterior Rescection, resection of L ext iliac artery,  ureterolysis  . Fracture surgery 1950'S    RIGHT ARM SURGERY FOR FX  . Examination under anesthesia 06/19/2011    Procedure: EXAM UNDER ANESTHESIA;  Surgeon: [name], MD;  Location: [facility];  Service: General;;  . Cystoscopy w/ ureteral stent placement 06/19/2011    Procedure: CYSTOSCOPY WITH RETROGRADE PYELOGRAM/URETERAL STENT PLACEMENT;  Surgeon: [name], MD;  Location: [facility];  Service: Urology;  Laterality: Bilateral;  Cystoscopy/Bilateral Double J Stent insertion , flexible cystoscopy   . Cystoscopy w/ ureteral stent placement 10/24/2011    Procedure: CYSTOSCOPY WITH STENT REPLACEMENT;  Surgeon: [name], MD;  Location: [facility];  Service: Urology;  Laterality: Bilateral;  45 mins requested for this case  C-ARM  . Cystoscopy w/ retrogrades 10/24/2011    Procedure: CYSTOSCOPY WITH RETROGRADE PYELOGRAM;  Surgeon: [name], MD;  Location: [facility];  Service: Urology;  Laterality: Left;    Family History  Problem Relation Age of Onset  . Stroke  Father 62  . Cancer Father     colon  . Lupus Daughter     History  Substance Use Topics  . Smoking status: Former Smoker -- 1.0 packs/day for 10 years    Quit date: 02/03/1974  . Smokeless tobacco: Never Used  . Alcohol Use: No      Review of Systems  Constitutional: Negative for fever and activity change.  HENT: Negative for congestion and rhinorrhea.   Respiratory: Negative for chest  tightness.   Cardiovascular: Negative for chest pain.  Gastrointestinal: Negative for nausea, vomiting and abdominal pain.  Genitourinary: Positive for difficulty urinating and penile pain. Negative for dysuria and hematuria.  Musculoskeletal: Negative for back pain.  Skin: Negative for rash.  Neurological: Negative for dizziness and headaches.    Allergies  Review of patient's allergies indicates no known allergies.  Home Medications   Current Outpatient Rx  Name  Route  Sig  Dispense  Refill  . IBUPROFEN 200 MG PO TABS   Oral   Take 400 mg by mouth every 6 (six) hours as needed. Pain         . CITRUCEL PO   Oral   Take 1 packet by mouth 2 (two) times daily.         . ADULT MULTIVITAMIN W/MINERALS CH   Oral   Take 1 tablet by mouth daily.         Marland Kitchen BOOST HIGH PROTEIN PO LIQD   Oral   Take 1 Can by mouth daily with breakfast. Per patient taking supplement that is in plastic bottle. Was not an option in database.           BP 123/75  Pulse 120  Temp 98 F (36.7 C) (Oral)  Resp 16  SpO2 100%  Physical Exam  Constitutional: He is oriented to person, place, and time. He appears well-developed and well-nourished. No distress.  HENT:  Head: Normocephalic and atraumatic.  Mouth/Throat: Oropharynx is clear and moist. No oropharyngeal exudate.  Eyes: Conjunctivae normal are normal. Pupils are equal, round, and reactive to light.  Neck: Normal range of motion. Neck supple.  Cardiovascular: Normal rate, regular rhythm and normal heart sounds.   No murmur heard. Pulmonary/Chest: Effort normal and breath sounds normal. No respiratory distress.  Abdominal: Soft. There is no tenderness. There is no rebound and no guarding.       R sided ileostomy  Genitourinary: No penile tenderness.       Urethral meatus appears normal. No testicular tenderness  Musculoskeletal: Normal range of motion. He exhibits no edema and no tenderness.  Neurological: He is alert and oriented to  person, place, and time. No cranial nerve deficit. He exhibits normal muscle tone. Coordination normal.  Skin: Skin is warm.    ED Course  Procedures (including critical care time)   Labs Reviewed  URINALYSIS, MICROSCOPIC ONLY   No results found.   No diagnosis found.    MDM  Chronic indwelling Foley but has become dislodged. Patient denies other complaints. No abdominal pain or back pain. He is tachycardic in triage which will be reassessed.  We'll attempt to replace Foley. Caution taken as patient states fistula between the bladder and colon. Patient states this is placed without difficulty in the urology office without any specialist equipment.  Foley catheter attempted to be replaced by nursing staff. No urinary return noted. On ultrasound Foley balloon not visible in bladder. Patient sent to CT scan to evaluate position of catheter. This shows that  it is within the colon and not the bladder.  Foley removed. D/w Dr. Isabel Caprice who will come evaluate patient.  Dr. Isabel Caprice successfully placed Foley catheter with cystoscope guidance. Patient remains tachycardic but denies symptoms. Patient does appear clinically dehydrated though he states he's been eating and drinking normally. He is given IV fluids in the ED. There's minimal improvement in his heart rate. He states he feels fine. Will need admission for persistent tachycardia, possible UTI, and concern for possible early sepsis.  Appears nontoxic at this time.   Date: 12/21/2011  Rate: 120  Rhythm: sinus tachycardia  QRS Axis: normal  Intervals: normal  ST/T Wave abnormalities: normal  Conduction Disutrbances:none  Narrative Interpretation:   Old EKG Reviewed: unchanged    Glynn Octave, MD 12/21/11 [phone]

## 2011-12-21 NOTE — ED Notes (Signed)
Bedside report received from previous RN. Urologist at bedside.

## 2011-12-21 NOTE — Progress Notes (Signed)
Called by nursing and informed that patient now running new fever of 102, put orders in for blood cultures, tylenol for fever.  Spoke with floor RN and told her that I would have a very low threshold for stepdown/ICU transfer of the patient if he were to clinically deteriorate further.  Was very non-toxic appearing at the time of my evaluation in ED (Sitting up in chair, eating burger, fries, and watching a football game and said he "felt fine".

## 2011-12-21 NOTE — H&P (Signed)
[facility] History and Physical  Eric Munoz ZOX:[id number] DOB: [birth year] DOA: 12/21/2011  Referring physician: ED PCP: No primary provider on file.  Specialists: Dr. Isabel Caprice of urology placed foley in ED.  Chief Complaint: Tachycardia  HPI: Eric Munoz is a 71 y.o. male who presented to the ED today after his foley catheter came out likely secondary to a balloon malfunction.  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  Unfortunately when the ED attempted to replace his catheter they managed to put the catheter directly into his fistula and into his rectum.  Urology came and replaced the catheter but after the procedure although the patient maintains he has no symptoms he was noted to be tachycardic in the 140s, patient was put on rocephin and hospitalist asked to admit for obs for his tachycardia presumed due to UTI.  Review of Systems: 12 systems reviewed and negative.  Past Medical History  Diagnosis Date  . Easy bruising   . 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 S/P chemotherapy, time since greater than 12 weeks 03-2010 last chemo  . S/P radiation > 12 weeks 02-2010    last radiation  . Urinoma, s/p bladder repair 01/20/2011  . Difficult intubation   . History of acute renal failure nov 2012  . Hydronephrosis, bilateral   . Colon cancer 2007, recurrent Jul2011    Recurrent, Stage 4, [id number] (Left colectomy/LAR , ChemoTx completed Feb2012/ XRT completed Jan2012  . Colovesical fistula   . Iron deficiency anemia   . Renal insufficiency   . S/P ileostomy   . Foley catheter in place    Past Surgical History  Procedure Date  . Portacath placement     09-17-09 right chest/ states removed 12-20-10  . Colostomy takedown 12/20/2010    Procedure: LAPAROSCOPIC COLOSTOMY TAKEDOWN;  Surgeon: [name], MD;  Location: [facility];   Service: General;  Laterality: N/A;  Laparoscopy Colostomy Takedownn With Ileostomy  . Port-a-cath removal 12/20/2010    Procedure: REMOVAL PORT-A-CATH;  Surgeon: [name], MD;  Location: [facility];  Service: General;  Laterality: Right;  Right  venous catheter no longer needed.  . Cystoscopy w/ ureteral stent placement 01/13/2011    Procedure: CYSTOSCOPY WITH RETROGRADE PYELOGRAM/URETERAL STENT PLACEMENT;  Surgeon: [name];  Location: [facility];  Service: Urology;  Laterality: Left;  intra-operative cystogram  . Tonsillectomy yrs ago  . Colon surgery 08/07/09    Low Anterior Rescection, resection of L ext iliac artery,  ureterolysis  . Fracture surgery 1950'S    RIGHT ARM SURGERY FOR FX  . Examination under anesthesia 06/19/2011    Procedure: EXAM UNDER ANESTHESIA;  Surgeon: [name], MD;  Location: [facility];  Service: General;;  . Cystoscopy w/ ureteral stent placement 06/19/2011    Procedure: CYSTOSCOPY WITH RETROGRADE PYELOGRAM/URETERAL STENT PLACEMENT;  Surgeon: [name], MD;  Location: [facility];  Service: Urology;  Laterality: Bilateral;  Cystoscopy/Bilateral Double J Stent insertion , flexible cystoscopy   . Cystoscopy w/ ureteral stent placement 10/24/2011    Procedure: CYSTOSCOPY WITH STENT REPLACEMENT;  Surgeon: [name], MD;  Location: [facility];  Service: Urology;  Laterality: Bilateral;  45 mins requested for this case  C-ARM  . Cystoscopy w/ retrogrades 10/24/2011    Procedure: CYSTOSCOPY WITH RETROGRADE PYELOGRAM;  Surgeon: [name], MD;  Location: [facility];  Service: Urology;  Laterality: Left;   Social History:  reports that he quit smoking about 37 years ago. He has never used smokeless tobacco. He reports that he does not drink alcohol or use illicit drugs.   No Known Allergies  Family History  Problem Relation Age of Onset  . Stroke Father 76  . Cancer Father     colon  . Lupus Daughter       Prior to Admission medications   Medication Sig Start Date End Date Taking? Authorizing Provider  ibuprofen (ADVIL,MOTRIN) 200 MG tablet Take 400 mg by mouth every 6 (six) hours as needed. Pain   Yes Historical Provider, MD  Methylcellulose, Laxative, (CITRUCEL PO) Take 1 packet by mouth 2 (two) times daily.   Yes Historical Provider, MD  Multiple Vitamin (MULTIVITAMIN WITH MINERALS) TABS Take 1 tablet by mouth daily.   Yes Historical Provider, MD  nutritional supplement (BOOST HIGH PROTEIN) LIQD Take 1 Can by mouth daily with breakfast. Per patient taking supplement that is in plastic bottle. Was not an option in database.   Yes Historical Provider, MD   Physical Exam: Filed Vitals:   12/21/11 2017 12/21/11 2019 12/21/11 2020 12/21/11 2100  BP: 138/78 136/70 119/62   Pulse: 123 135 145 133  Temp: 99.5 F (37.5 C)     TempSrc: Oral     Resp: 22   20  SpO2: 100%   98%    General:  NAD, resting comfortably in bed Eyes: PEERLA EOMI ENT: mucous membranes moist Neck: supple w/o JVD Cardiovascular: very tachycardic w/o MRG Respiratory: CTA B Abdomen: soft, nt, nd, bs+ Skin: no rash nor lesion Musculoskeletal: MAE, full ROM all 4 extremities Psychiatric: normal tone and affect Neurologic: AAOx3, grossly non-focal  Labs on Admission:  Basic Metabolic Panel:  Lab 12/21/11 1610  NA 137  K 4.4  CL 106  CO2 16*  GLUCOSE 98  BUN 51*  CREATININE 2.61*  CALCIUM 9.7  MG --  PHOS --   Liver Function Tests: No results found for this basename: AST:5,ALT:5,ALKPHOS:5,BILITOT:5,PROT:5,ALBUMIN:5 in the last 168 hours No results found for this basename: LIPASE:5,AMYLASE:5 in the last 168 hours No results found for this basename: AMMONIA:5 in the last 168 hours CBC:  Lab 12/21/11 1850  WBC 6.9  NEUTROABS 6.0  HGB 10.1*  HCT 29.8*  MCV 90.3  PLT 310   Cardiac Enzymes: No results found for this basename: CKTOTAL:5,CKMB:5,CKMBINDEX:5,TROPONINI:5 in the last 168 hours  BNP (last  3 results) No results found for this basename: PROBNP:3 in the last 8760 hours CBG: No results found for this basename: GLUCAP:5 in the last 168 hours  Radiological Exams on Admission: Ct Pelvis Wo Contrast  12/21/2011  *RADIOLOGY REPORT*  Clinical Data:  Placed Foley catheter with low urine output, past history of colovesicle fistula post surgery; remote low anterior resection for stage IV colon cancer  CT PELVIS WITHOUT CONTRAST  Technique:  Multidetector CT imaging of the pelvis was performed following the standard protocol without intravenous contrast. Sagittal and coronal MPR images reconstructed from axial data set. A mixture of 40 ml Omnipaque-300 and 80 ml saline was injected into the Foley catheter.  Comparison: CT images from PET CT 06/16/2011  Findings: Bilateral ureteral stents. Foley catheter traverses the prostate gland then extends to the right, posterior to the urinary bladder. Contrast injected through the Foley catheter opacifies a bowel loop in the pelvis as well as the cecum and appendix. This indicates catheter  has passed from the urethra into the distal colon. No contrast extravasation identified. Again identified presacral collection adjacent to the rectum, question related to prior surgery though tumor cannot be excluded in this setting. Gas and fluid collection anterior to the left ureteral stent questionably demonstrates a staple line, suspect related to prior surgery, approximately 3.1 x 2.0 x 3.2 cm in size, unchanged. No free intraperitoneal air or fluid. Bones appear demineralized.  IMPRESSION: Placed Foley catheter is within the distal colon with administered contrast opacifying the colon, cecum and appendix. Bilateral ureteral stents. Otherwise stable postoperative pelvic changes.   Original Report Authenticated By: Ulyses Southward, M.D.     EKG: Independently reviewed. Demonstrates sinus tach with very obvious P waves.  Assessment/Plan Principal Problem:   *Tachycardia Active Problems:  Colourethral fistula   UTI (lower urinary tract infection)   1. Tachycardia - S.Tach most likely secondary to UTI from the catheter placement performed earlier this evening, putting patient on rocephin and giving fluids to see if this will improve.  No other SIRS criteria met for diagnosis of sepsis and patient doesn't look / feel ill at this point. 2. UTI - secondary to a colo urethral fistula 3. 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  No formal consultations called.  Code Status: Full Code (must indicate code status--if unknown or must be presumed, indicate so) Family Communication: Spoke with patient and wife at bedside (indicate person spoken with, if applicable, with phone number if by telephone) Disposition Plan: Admit to obs (indicate anticipated LOS)  Time spent: 70 min  GARDNER, JARED M. [facility] Pager [phone]  If 7PM-7AM, please contact night-coverage [email or web address] Password [email or web address] 12/21/2011, 9:39 PM

## 2011-12-22 DIAGNOSIS — N360 Urethral fistula: Secondary | ICD-10-CM

## 2011-12-22 DIAGNOSIS — R000 Tachycardia, unspecified: Secondary | ICD-10-CM

## 2011-12-22 DIAGNOSIS — N390 Urinary tract infection, site not specified: Secondary | ICD-10-CM

## 2011-12-22 DIAGNOSIS — N289 Disorder of kidney and ureter, unspecified: Secondary | ICD-10-CM

## 2011-12-22 LAB — BASIC METABOLIC PANEL
BUN: 44 mg/dL — ABNORMAL HIGH (ref 6–23)
CO2: 16 mEq/L — ABNORMAL LOW (ref 19–32)
Calcium: 8.8 mg/dL (ref 8.4–10.5)
Chloride: 110 mEq/L (ref 96–112)
Creatinine, Ser: 2.56 mg/dL — ABNORMAL HIGH (ref 0.50–1.35)
GFR calc Af Amer: 27 mL/min — ABNORMAL LOW (ref >90)
GFR calc non Af Amer: 24 mL/min — ABNORMAL LOW (ref >90)
Glucose, Bld: 93 mg/dL (ref 70–99)
Potassium: 3.7 mEq/L (ref 3.5–5.1)
Sodium: 137 mEq/L (ref 135–145)

## 2011-12-22 LAB — CBC
MCH: 30.8 pg (ref 26.0–34.0)
MCHC: 33.6 g/dL (ref 30.0–36.0)
RDW: 16.8 % — ABNORMAL HIGH (ref 11.5–15.5)

## 2011-12-22 LAB — MRSA PCR SCREENING: MRSA by PCR: NEGATIVE

## 2011-12-22 MED ORDER — BOOST PLUS PO LIQD: 237.0000 mL | Freq: Every day | ORAL | Status: DC

## 2011-12-22 MED ORDER — LEVOFLOXACIN 500 MG PO TABS: 500.0000 mg | ORAL_TABLET | Freq: Every day | ORAL | Status: DC

## 2011-12-22 NOTE — Progress Notes (Signed)
   CARE MANAGEMENT NOTE 12/22/2011  Patient:  Eric Munoz, Eric Munoz   Account Number:  [id number]  Date Initiated:  12/22/2011  Documentation initiated by:  Jiles Crocker  Subjective/Objective Assessment:   ADMITTED WITH TACHYCARDIA, UTI, Colourethral fistula     Action/Plan:   LIVES AT HOME WITH SPOUSE; POSSIBLY NEED HHC AT DISCHARGE;   Anticipated DC Date:  12/25/2011   Anticipated DC Plan:  HOME W HOME HEALTH SERVICES      DC Planning Services  CM consult              Status of service:  In process, will continue to follow Medicare Important Message given?  NA - LOS <3 / Initial given by admissions (If response is "NO", the following Medicare IM given date fields will be blank)  Per UR Regulation:  Reviewed for med. necessity/level of care/duration of stay Comments:  12/22/2011- B Kely Dohn RN,BSN,MHA

## 2011-12-22 NOTE — Progress Notes (Signed)
MRSA PCR (-).

## 2011-12-22 NOTE — Discharge Summary (Addendum)
Physician Discharge Summary  [name]:[id number] DOB: [birth year] DOA: 12/21/2011  PCP: No primary provider on file.  Admit date: 12/21/2011 Discharge date: 12/22/2011  Time spent: 45 minutes  Recommendations for Outpatient Follow-up:  1. Follow up with urology regarding urine cultures on Wednesday.  Discharge Diagnoses:  Principal Problem:  *Tachycardia Active Problems:  Colourethral fistula   UTI (lower urinary tract infection)   Discharge Condition: stable  Diet recommendation: low salt diet  Filed Weights   12/22/11 0629  Weight: 63.5 kg (139 lb 15.9 oz)    History of present illness:  Eric Munoz is a 71 y.o. male who presented to the ED today after his foley catheter came out likely secondary to a balloon malfunction. 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 Unfortunately when the ED attempted to replace his catheter they managed to put the catheter directly into his fistula and into his rectum. Urology came and replaced the catheter but after the procedure although the patient maintains he has no symptoms he was noted to be tachycardic in the 140s, patient was put on rocephin and hospitalist asked to admit for obs for his tachycardia presumed due to UTI.   Hospital Course:  1. Tachycardia - S.Tach most likely secondary to UTI from the catheter placement performed earlier this evening. He was put on rocephin and was  given fluids.  Resolved. No other SIRS criteria met for diagnosis of sepsis and patient doesn't look / feel ill at this point. 2. UTI - secondary to a colo urethral fistula. Urine cultures pending. Pt is being discharged on levaquin to complete the course. Recommend follow up the urine cultures with urology on Wednesday.  3. 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 4. Acute on chronic  renal insufficiency: fluids and repeat labs in one week. Stop ibuprofen.  Consultations:  urology  Discharge Exam: Filed Vitals:   12/22/11 8119 12/22/11 1000 12/22/11 1417 12/22/11 1747  BP:  105/58 101/61 112/60  Pulse:  96 97 80  Temp: 98.4 F (36.9 C) 98.2 F (36.8 C) 98.7 F (37.1 C) 99 F (37.2 C)  TempSrc:  Oral Oral Oral  Resp:  20 18 18   Height:      Weight:      SpO2:  100% 100% 100%    General: alert afebrile comfortable Cardiovascular: s1s2 Respiratory: CTAB Abdomen: soft NT ND BS+ Extremities; no pedal edema  Discharge Instructions  Discharge Orders    Future Appointments: Provider: Department: Dept Phone: Center:   03/05/2012 3:30 PM Krista Blue [facility] MEDICAL ONCOLOGY [phone] None   03/05/2012 4:00 PM Samul Dada, MD [facility] MEDICAL ONCOLOGY [phone] None     Future Orders Please Complete By Expires   Discharge instructions      Comments:   Follow up with UROLOGY ON Wednesday to discuss the urine cultures .   Activity as tolerated - No restrictions          Medication List     As of 12/22/2011 10:59 PM    STOP taking these medications         ibuprofen 200 MG tablet   Commonly known as: ADVIL,MOTRIN      TAKE these medications         CITRUCEL PO   Take 1  packet by mouth 2 (two) times daily.      feeding supplement Liqd   Take 1 Can by mouth daily with breakfast. Per patient taking supplement that is in plastic bottle. Was not an option in database.      lactose free nutrition Liqd   Take 237 mLs by mouth at bedtime.      levofloxacin 500 MG tablet   Commonly known as: LEVAQUIN   Take 1 tablet (500 mg total) by mouth daily.      multivitamin with minerals Tabs   Take 1 tablet by mouth daily.          The results of significant diagnostics from this hospitalization (including imaging, microbiology, ancillary and laboratory) are listed below for reference.    Significant  Diagnostic Studies: Ct Pelvis Wo Contrast  12/21/2011  *RADIOLOGY REPORT*  Clinical Data:  Placed Foley catheter with low urine output, past history of colovesicle fistula post surgery; remote low anterior resection for stage IV colon cancer  CT PELVIS WITHOUT CONTRAST  Technique:  Multidetector CT imaging of the pelvis was performed following the standard protocol without intravenous contrast. Sagittal and coronal MPR images reconstructed from axial data set. A mixture of 40 ml Omnipaque-300 and 80 ml saline was injected into the Foley catheter.  Comparison: CT images from PET CT 06/16/2011  Findings: Bilateral ureteral stents. Foley catheter traverses the prostate gland then extends to the right, posterior to the urinary bladder. Contrast injected through the Foley catheter opacifies a bowel loop in the pelvis as well as the cecum and appendix. This indicates catheter has passed from the urethra into the distal colon. No contrast extravasation identified. Again identified presacral collection adjacent to the rectum, question related to prior surgery though tumor cannot be excluded in this setting. Gas and fluid collection anterior to the left ureteral stent questionably demonstrates a staple line, suspect related to prior surgery, approximately 3.1 x 2.0 x 3.2 cm in size, unchanged. No free intraperitoneal air or fluid. Bones appear demineralized.  IMPRESSION: Placed Foley catheter is within the distal colon with administered contrast opacifying the colon, cecum and appendix. Bilateral ureteral stents. Otherwise stable postoperative pelvic changes.   Original Report Authenticated By: Ulyses Southward, M.D.     Microbiology: Recent Results (from the past 240 hour(s))  MRSA PCR SCREENING     Status: Normal   Collection Time   12/22/11 12:06 AM      Component Value Range Status Comment   MRSA by PCR NEGATIVE  NEGATIVE Final      Labs: Basic Metabolic Panel:  Lab 12/22/11 9604 12/21/11 1850  NA 137 137    K 3.7 4.4  CL 110 106  CO2 16* 16*  GLUCOSE 93 98  BUN 44* 51*  CREATININE 2.56* 2.61*  CALCIUM 8.8 9.7  MG -- --  PHOS -- --   Liver Function Tests: No results found for this basename: AST:5,ALT:5,ALKPHOS:5,BILITOT:5,PROT:5,ALBUMIN:5 in the last 168 hours No results found for this basename: LIPASE:5,AMYLASE:5 in the last 168 hours No results found for this basename: AMMONIA:5 in the last 168 hours CBC:  Lab 12/22/11 0441 12/21/11 1850  WBC 8.5 6.9  NEUTROABS -- 6.0  HGB 8.8* 10.1*  HCT 26.2* 29.8*  MCV 91.6 90.3  PLT 241 310   Cardiac Enzymes: No results found for this basename: CKTOTAL:5,CKMB:5,CKMBINDEX:5,TROPONINI:5 in the last 168 hours BNP: BNP (last 3 results) No results found for this basename: PROBNP:3 in the last 8760 hours CBG: No results found for this  basename: GLUCAP:5 in the last 168 hours     Signed:  Saavi Mceachron  [facility] 12/22/2011, 10:59 PM

## 2011-12-22 NOTE — Plan of Care (Signed)
Problem: Phase I Progression Outcomes Goal: Voiding-avoid urinary catheter unless indicated Outcome: Not Met (add Reason) Pt. Has a chronic foley in place.  Problem: Phase II Progression Outcomes Goal: Voiding independently Outcome: Not Applicable Date Met:  12/22/11 Chronic foley in place.

## 2011-12-24 LAB — URINE CULTURE

## 2012-01-09 ENCOUNTER — Emergency Department (HOSPITAL_COMMUNITY): Payer: BC Managed Care – PPO

## 2012-01-09 ENCOUNTER — Inpatient Hospital Stay (HOSPITAL_COMMUNITY)
Admission: EM | Admit: 2012-01-09 | Discharge: 2012-01-12 | DRG: 304 | Disposition: A | Discharge status: Home / Self Care | Payer: BC Managed Care – PPO | Attending: Internal Medicine | Admitting: Internal Medicine

## 2012-01-09 ENCOUNTER — Encounter (HOSPITAL_COMMUNITY): Payer: Self-pay | Admitting: Emergency Medicine

## 2012-01-09 DIAGNOSIS — N179 Acute kidney failure, unspecified: Principal | ICD-10-CM | POA: Diagnosis present

## 2012-01-09 DIAGNOSIS — N360 Urethral fistula: Secondary | ICD-10-CM | POA: Diagnosis present

## 2012-01-09 DIAGNOSIS — N19 Unspecified kidney failure: Secondary | ICD-10-CM

## 2012-01-09 DIAGNOSIS — N12 Tubulo-interstitial nephritis, not specified as acute or chronic: Secondary | ICD-10-CM | POA: Diagnosis present

## 2012-01-09 DIAGNOSIS — N139 Obstructive and reflux uropathy, unspecified: Secondary | ICD-10-CM | POA: Diagnosis present

## 2012-01-09 DIAGNOSIS — K632 Fistula of intestine: Secondary | ICD-10-CM | POA: Diagnosis present

## 2012-01-09 DIAGNOSIS — C19 Malignant neoplasm of rectosigmoid junction: Secondary | ICD-10-CM | POA: Diagnosis present

## 2012-01-09 DIAGNOSIS — D649 Anemia, unspecified: Secondary | ICD-10-CM | POA: Diagnosis present

## 2012-01-09 DIAGNOSIS — E872 Acidosis, unspecified: Secondary | ICD-10-CM | POA: Diagnosis present

## 2012-01-09 DIAGNOSIS — N1330 Unspecified hydronephrosis: Secondary | ICD-10-CM | POA: Diagnosis present

## 2012-01-09 DIAGNOSIS — N321 Vesicointestinal fistula: Secondary | ICD-10-CM | POA: Diagnosis present

## 2012-01-09 DIAGNOSIS — E875 Hyperkalemia: Secondary | ICD-10-CM | POA: Diagnosis present

## 2012-01-09 DIAGNOSIS — Z932 Ileostomy status: Secondary | ICD-10-CM

## 2012-01-09 DIAGNOSIS — E876 Hypokalemia: Secondary | ICD-10-CM | POA: Diagnosis not present

## 2012-01-09 DIAGNOSIS — D638 Anemia in other chronic diseases classified elsewhere: Secondary | ICD-10-CM | POA: Diagnosis present

## 2012-01-09 DIAGNOSIS — Z9049 Acquired absence of other specified parts of digestive tract: Secondary | ICD-10-CM

## 2012-01-09 DIAGNOSIS — N136 Pyonephrosis: Secondary | ICD-10-CM | POA: Diagnosis present

## 2012-01-09 LAB — BASIC METABOLIC PANEL
BUN: 113 mg/dL — ABNORMAL HIGH (ref 6–23)
Calcium: 10.1 mg/dL (ref 8.4–10.5)
Chloride: 107 mEq/L (ref 96–112)
Creatinine, Ser: 6.32 mg/dL — ABNORMAL HIGH (ref 0.50–1.35)
GFR calc Af Amer: 9 mL/min — ABNORMAL LOW (ref >90)
GFR calc non Af Amer: 8 mL/min — ABNORMAL LOW (ref >90)

## 2012-01-09 LAB — CBC WITH DIFFERENTIAL/PLATELET
Basophils Absolute: 0 10*3/uL (ref 0.0–0.1)
Basophils Relative: 0 % (ref 0–1)
Eosinophils Relative: 1 % (ref 0–5)
HCT: 29.7 % — ABNORMAL LOW (ref 39.0–52.0)
MCHC: 33 g/dL (ref 30.0–36.0)
Monocytes Absolute: 0.9 10*3/uL (ref 0.1–1.0)
Neutro Abs: 7.5 10*3/uL (ref 1.7–7.7)
Platelets: 472 10*3/uL — ABNORMAL HIGH (ref 150–400)
RDW: 15.2 % (ref 11.5–15.5)
WBC: 8.9 10*3/uL (ref 4.0–10.5)

## 2012-01-09 LAB — HEPATIC FUNCTION PANEL
AST: 19 U/L (ref 0–37)
Albumin: 3.2 g/dL — ABNORMAL LOW (ref 3.5–5.2)
Alkaline Phosphatase: 116 U/L (ref 39–117)
Total Bilirubin: 0.2 mg/dL — ABNORMAL LOW (ref 0.3–1.2)
Total Protein: 7.8 g/dL (ref 6.0–8.3)

## 2012-01-09 LAB — MAGNESIUM: Magnesium: 2.4 mg/dL (ref 1.5–2.5)

## 2012-01-09 LAB — PHOSPHORUS: Phosphorus: 6 mg/dL — ABNORMAL HIGH (ref 2.3–4.6)

## 2012-01-09 MED ORDER — ENSURE COMPLETE PO LIQD
237.0000 mL | Freq: Every day | ORAL | Status: DC
  Administered 2012-01-10 – 2012-01-11 (×2): 237 mL via ORAL

## 2012-01-09 MED ORDER — BOOST PLUS PO LIQD
237.0000 mL | Freq: Every day | ORAL | Status: DC
  Administered 2012-01-10 – 2012-01-11 (×2): 237 mL via ORAL
  Filled 2012-01-09 (×4): qty 237

## 2012-01-09 MED ORDER — ONDANSETRON HCL 4 MG PO TABS: 4.0000 mg | ORAL_TABLET | Freq: Four times a day (QID) | ORAL | Status: DC | PRN

## 2012-01-09 MED ORDER — ADULT MULTIVITAMIN W/MINERALS CH
1.0000 | ORAL_TABLET | Freq: Every day | ORAL | Status: DC
  Administered 2012-01-09 – 2012-01-12 (×3): 1 via ORAL
  Filled 2012-01-09 (×5): qty 1

## 2012-01-09 MED ORDER — ONDANSETRON HCL 4 MG/2ML IJ SOLN: 4.0000 mg | Freq: Four times a day (QID) | INTRAMUSCULAR | Status: DC | PRN

## 2012-01-09 MED ORDER — SODIUM POLYSTYRENE SULFONATE 15 GM/60ML PO SUSP
30.0000 g | Freq: Once | ORAL | Status: AC
  Administered 2012-01-09: 30 g via ORAL
  Filled 2012-01-09: qty 120

## 2012-01-09 MED ORDER — SODIUM BICARBONATE 8.4 % IV SOLN
INTRAVENOUS | Status: DC
  Administered 2012-01-09 – 2012-01-11 (×4): via INTRAVENOUS
  Filled 2012-01-09 (×9): qty 150

## 2012-01-09 MED ORDER — BOOST HIGH PROTEIN PO LIQD
1.0000 | ORAL | Status: DC
  Filled 2012-01-09: qty 1

## 2012-01-09 MED ORDER — ACETAMINOPHEN 650 MG RE SUPP: 650.0000 mg | Freq: Four times a day (QID) | RECTAL | Status: DC | PRN

## 2012-01-09 MED ORDER — ACETAMINOPHEN 325 MG PO TABS
650.0000 mg | ORAL_TABLET | Freq: Four times a day (QID) | ORAL | Status: DC | PRN
  Administered 2012-01-10: 650 mg via ORAL
  Filled 2012-01-09 (×2): qty 2

## 2012-01-09 NOTE — ED Provider Notes (Addendum)
History     CSN: [id number]  Arrival date & time 01/09/12  1356   First MD Initiated Contact with Patient 01/09/12 1518      Chief Complaint  Patient presents with  . Abnormal Lab    (Consider location/radiation/quality/duration/timing/severity/associated sxs/prior treatment) HPI Patient called by renal physician today with abnormal lab work stating creatinine and potassium were elevated. Patient had lab work drawn yesterday from [facility] with creatinine of 6.13 and potassium of 5.8 with bicarbonate of 13 patient is asymptomatic except for mild generalized weakness and feels well otherwise. No treatment prior to coming here Past Medical History  Diagnosis Date  . Easy bruising   . 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 S/P chemotherapy, time since greater than 12 weeks 03-2010 last chemo  . S/P radiation > 12 weeks 02-2010    last radiation  . Urinoma, s/p bladder repair 01/20/2011  . Difficult intubation   . History of acute renal failure nov 2012  . Hydronephrosis, bilateral   . Colon cancer 2007, recurrent Jul2011    Recurrent, Stage 4, [id number] (Left colectomy/LAR , ChemoTx completed Feb2012/ XRT completed Jan2012  . Colovesical fistula   . Iron deficiency anemia   . Renal insufficiency   . S/P ileostomy   . Foley catheter in place     Past Surgical History  Procedure Date  . Portacath placement     09-17-09 right chest/ states removed 12-20-10  . Colostomy takedown 12/20/2010    Procedure: LAPAROSCOPIC COLOSTOMY TAKEDOWN;  Surgeon: [name], MD;  Location: [facility];  Service: General;  Laterality: N/A;  Laparoscopy Colostomy Takedownn With Ileostomy  . Port-a-cath removal 12/20/2010    Procedure: REMOVAL PORT-A-CATH;  Surgeon: [name], MD;  Location: [facility];  Service: General;  Laterality: Right;  Right  venous catheter no longer needed.  . Cystoscopy w/ ureteral stent placement 01/13/2011     Procedure: CYSTOSCOPY WITH RETROGRADE PYELOGRAM/URETERAL STENT PLACEMENT;  Surgeon: [name];  Location: [facility];  Service: Urology;  Laterality: Left;  intra-operative cystogram  . Tonsillectomy yrs ago  . Colon surgery 08/07/09    Low Anterior Rescection, resection of L ext iliac artery,  ureterolysis  . Fracture surgery 1950'S    RIGHT ARM SURGERY FOR FX  . Examination under anesthesia 06/19/2011    Procedure: EXAM UNDER ANESTHESIA;  Surgeon: [name], MD;  Location: [facility];  Service: General;;  . Cystoscopy w/ ureteral stent placement 06/19/2011    Procedure: CYSTOSCOPY WITH RETROGRADE PYELOGRAM/URETERAL STENT PLACEMENT;  Surgeon: [name], MD;  Location: [facility];  Service: Urology;  Laterality: Bilateral;  Cystoscopy/Bilateral Double J Stent insertion , flexible cystoscopy   . Cystoscopy w/ ureteral stent placement 10/24/2011    Procedure: CYSTOSCOPY WITH STENT REPLACEMENT;  Surgeon: [name], MD;  Location: [facility];  Service: Urology;  Laterality: Bilateral;  45 mins requested for this case  C-ARM  . Cystoscopy w/ retrogrades 10/24/2011    Procedure: CYSTOSCOPY WITH RETROGRADE PYELOGRAM;  Surgeon: [name], MD;  Location: [facility];  Service: Urology;  Laterality: Left;    Family History  Problem Relation Age of Onset  . Stroke Father 70  . Cancer Father     colon  . Lupus Daughter     History  Substance Use Topics  . Smoking status: Former Smoker -- 1.0 packs/day for 10 years    Quit date: 02/03/1974  .  Smokeless tobacco: Never Used  . Alcohol Use: No      Review of Systems  Neurological: Positive for weakness.  All other systems reviewed and are negative.    Allergies  Review of patient's allergies indicates no known allergies.  Home Medications   Current Outpatient Rx  Name  Route  Sig  Dispense  Refill  . BOOST PLUS PO LIQD   Oral   Take 237 mLs by mouth at bedtime.         . ADULT  MULTIVITAMIN W/MINERALS CH   Oral   Take 1 tablet by mouth daily.         Marland Kitchen BOOST HIGH PROTEIN PO LIQD   Oral   Take 1 Can by mouth daily with breakfast. Per patient taking supplement that is in plastic bottle. Was not an option in database.           BP 143/72  Pulse 102  Temp 98.1 F (36.7 C) (Oral)  Ht 5\' 9"  (1.753 m)  Wt 135 lb (61.236 kg)  BMI 19.94 kg/m2  SpO2 100%  Physical Exam  Nursing note and vitals reviewed. Constitutional: He appears well-developed and well-nourished.  HENT:  Head: Normocephalic and atraumatic.  Eyes: Conjunctivae normal are normal. Pupils are equal, round, and reactive to light.  Neck: Neck supple. No tracheal deviation present. No thyromegaly present.  Cardiovascular: Normal rate and regular rhythm.   No murmur heard. Pulmonary/Chest: Effort normal and breath sounds normal.  Abdominal: Soft. Bowel sounds are normal. He exhibits no distension. There is no tenderness.  Musculoskeletal: Normal range of motion. He exhibits no edema and no tenderness.  Neurological: He is alert. Coordination normal.  Skin: Skin is warm and dry. No rash noted.  Psychiatric: He has a normal mood and affect.    ED Course  Procedures (including critical care time)   Labs Reviewed  BASIC METABOLIC PANEL  CBC WITH DIFFERENTIAL   No results found.   No diagnosis found.   Date: 01/09/2012  Rate: 95  Rhythm: normal sinus rhythm  QRS Axis: normal  Intervals: normal  ST/T Wave abnormalities: normal  Conduction Disutrbances:none  Narrative Interpretation:   Old EKG Reviewed: Unchanged from 12/21/2011 interpreted by me  Results for orders placed during the hospital encounter of 01/09/12  BASIC METABOLIC PANEL      Component Value Range   Sodium 135  135 - 145 mEq/L   Potassium 5.8 (*) 3.5 - 5.1 mEq/L   Chloride 107  96 - 112 mEq/L   CO2 11 (*) 19 - 32 mEq/L   Glucose, Bld 94  70 - 99 mg/dL   BUN 782 (*) 6 - 23 mg/dL   Creatinine, Ser 9.56 (*) 0.50  - 1.35 mg/dL   Calcium 21.3  8.4 - 08.6 mg/dL   GFR calc non Af Amer 8 (*) >90 mL/min   GFR calc Af Amer 9 (*) >90 mL/min  CBC WITH DIFFERENTIAL      Component Value Range   WBC 8.9  4.0 - 10.5 K/uL   RBC 3.31 (*) 4.22 - 5.81 MIL/uL   Hemoglobin 9.8 (*) 13.0 - 17.0 g/dL   HCT 57.8 (*) 46.9 - 62.9 %   MCV 89.7  78.0 - 100.0 fL   MCH 29.6  26.0 - 34.0 pg   MCHC 33.0  30.0 - 36.0 g/dL   RDW 52.8  41.3 - 24.4 %   Platelets 472 (*) 150 - 400 K/uL   Neutrophils Relative 84 (*) 43 -  77 %   Neutro Abs 7.5  1.7 - 7.7 K/uL   Lymphocytes Relative 5 (*) 12 - 46 %   Lymphs Abs 0.4 (*) 0.7 - 4.0 K/uL   Monocytes Relative 10  3 - 12 %   Monocytes Absolute 0.9  0.1 - 1.0 K/uL   Eosinophils Relative 1  0 - 5 %   Eosinophils Absolute 0.1  0.0 - 0.7 K/uL   Basophils Relative 0  0 - 1 %   Basophils Absolute 0.0  0.0 - 0.1 K/uL   Ct Pelvis Wo Contrast  12/21/2011  *RADIOLOGY REPORT*  Clinical Data:  Placed Foley catheter with low urine output, past history of colovesicle fistula post surgery; remote low anterior resection for stage IV colon cancer  CT PELVIS WITHOUT CONTRAST  Technique:  Multidetector CT imaging of the pelvis was performed following the standard protocol without intravenous contrast. Sagittal and coronal MPR images reconstructed from axial data set. A mixture of 40 ml Omnipaque-300 and 80 ml saline was injected into the Foley catheter.  Comparison: CT images from PET CT 06/16/2011  Findings: Bilateral ureteral stents. Foley catheter traverses the prostate gland then extends to the right, posterior to the urinary bladder. Contrast injected through the Foley catheter opacifies a bowel loop in the pelvis as well as the cecum and appendix. This indicates catheter has passed from the urethra into the distal colon. No contrast extravasation identified. Again identified presacral collection adjacent to the rectum, question related to prior surgery though tumor cannot be excluded in this setting. Gas  and fluid collection anterior to the left ureteral stent questionably demonstrates a staple line, suspect related to prior surgery, approximately 3.1 x 2.0 x 3.2 cm in size, unchanged. No free intraperitoneal air or fluid. Bones appear demineralized.  IMPRESSION: Placed Foley catheter is within the distal colon with administered contrast opacifying the colon, cecum and appendix. Bilateral ureteral stents. Otherwise stable postoperative pelvic changes.   Original Report Authenticated By: Ulyses Southward, M.D.    Bladder scan shows 17 ml urine in bladder MDM  Spoke with Dr. Briant Cedar, suggest renal ultrasound, IV hydration of saline 100 mL per hour, Kayexalate admit to hospitalist service nephrology service will consult on patient in the morning. Spoke with Dr. Lethea Killings will arrange for 23 hour observation admission Diagnosis #1 renal failure #2 hyperkalemia        Doug Sou, MD 01/09/12 Guadlupe Spanish, MD 01/09/12 [phone]

## 2012-01-09 NOTE — H&P (Signed)
[facility] History and Physical  Eric Munoz WUJ:[id number] DOB: 06-Sep-1940 DOA: 01/09/2012  Referring physician: Dr. Doug Sou PCP: No primary provider on file.  Urologist: Dr. Patsi Sears Oncologist: Dr. Arline Asp Vascular surgeon: Dr. Darrick Penna   Chief Complaint: Abnormal labs.   History of Present Illness: Eric Munoz is an 71 y.o. male with a PMH rectosigmoid cancer status post multiple surgeries.  He was originally diagnosed 03/2004 and had a transanal resection of rectal polyp and a sigmoid colectomy at that time.   He had a recurrent mass noted 06/2009 when he presented with abdominal pain and left hydronephrosis. A double-J stent was placed.  He underwent a repeat colonoscopy with biopsies which confirmed recurrent malignancy versus a new primary. He subsequently underwent exploratory laparotomy, lysis of adhesions, redo low anterior resection, En-bloc resection with psoas muscle, left external iliac artery, ileum with end-descending colostomy; Left iliac internal and external iliac lymphadenectomy including paraaortic lymphadenectomy; Left ureterolysis and ureter transposition; Left en-bloc resection of left external iliac artery with left hypogastric artery/internal iliac transposition reconstruction and splenic flexure mobilization.  He underwent an exploratory laparotomy, evacuation of hematoma, completion proctectomy and left ureter to right psoas hitching the next day.  He has had to have recurrent stents placed by the urologist for recurrent hydronephrosis. 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  He apparently had labs drawn by his physician yesterday, and was advised to come to the hospital to evaluate abnormalities found on his lab work. Lab work here shows acute renal failure with a creatinine of 6.13 and hyperkalemia with potassium of 5.8. He also has metabolic acidosis with a bicarbonate of 13. The patient himself is relatively  asymptomatic except for some mild generalized weakness. The patient has had good urine output through his indwelling Foley catheter.  Review of Systems: Constitutional: No fever, no chills;  Appetite diminished, + fatigue; No weight loss, no weight gain.  HEENT: No blurry vision, no diplopia, no pharyngitis, no dysphagia CV: No chest pain, no palpitations.  Resp: No SOB, no cough. GI: No nausea, no vomiting, no diarrhea, no melena, no hematochezia, + ileostomy.  GU: No dysuria, no hematuria, + indwelling foley.  MSK: no myalgias, no arthralgias.  Neuro:  No headache, no focal neurological deficits, no history of seizures.  Psych: No depression, no anxiety.  Endo: No thyroid disease, no DM, no heat intolerance, no cold intolerance, no polyuria, no polydipsia  Skin: No rashes, no skin lesions.  Heme: No easy bruising, no history of blood diseases.  Past Medical History Past Medical History  Diagnosis Date  . Easy bruising   . 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 S/P chemotherapy, time since greater than 12 weeks 03-2010 last chemo  . S/P radiation > 12 weeks 02-2010    last radiation  . Urinoma, s/p bladder repair 01/20/2011  . Difficult intubation   . History of acute renal failure nov 2012  . Hydronephrosis, bilateral     Status post surgical intervention  . Colon cancer 2007, recurrent Jul2011    Recurrent, Stage 4, [id number] (Left colectomy/LAR , ChemoTx completed Feb2012/ XRT completed Jan2012  . Colovesical fistula   . Iron deficiency anemia   . Renal insufficiency   . S/P ileostomy   . Foley catheter in place      Past Surgical History Past Surgical History  Procedure Date  . Portacath placement  09-17-09 right chest/ states removed 12-20-10  . Colostomy takedown 12/20/2010    Procedure: LAPAROSCOPIC COLOSTOMY TAKEDOWN;  Surgeon: [name], MD;  Location: [facility];  Service: General;  Laterality: N/A;  Laparoscopy  Colostomy Takedownn With Ileostomy  . Port-a-cath removal 12/20/2010    Procedure: REMOVAL PORT-A-CATH;  Surgeon: [name], MD;  Location: [facility];  Service: General;  Laterality: Right;  Right  venous catheter no longer needed.  . Cystoscopy w/ ureteral stent placement 01/13/2011    Procedure: CYSTOSCOPY WITH RETROGRADE PYELOGRAM/URETERAL STENT PLACEMENT;  Surgeon: [name];  Location: [facility];  Service: Urology;  Laterality: Left;  intra-operative cystogram  . Tonsillectomy yrs ago  . Colon surgery 08/07/09    Low Anterior Rescection, resection of L ext iliac artery,  ureterolysis  . Fracture surgery 1950'S    RIGHT ARM SURGERY FOR FX  . Examination under anesthesia 06/19/2011    Procedure: EXAM UNDER ANESTHESIA;  Surgeon: [name], MD;  Location: [facility];  Service: General;;  . Cystoscopy w/ ureteral stent placement 06/19/2011    Procedure: CYSTOSCOPY WITH RETROGRADE PYELOGRAM/URETERAL STENT PLACEMENT;  Surgeon: [name], MD;  Location: [facility];  Service: Urology;  Laterality: Bilateral;  Cystoscopy/Bilateral Double J Stent insertion , flexible cystoscopy   . Cystoscopy w/ ureteral stent placement 10/24/2011    Procedure: CYSTOSCOPY WITH STENT REPLACEMENT;  Surgeon: [name], MD;  Location[facility];  Service: Urology;  Laterality: Bilateral;  45 mins requested for this case   . Cystoscopy w/ retrogrades 10/24/2011    Procedure: CYSTOSCOPY WITH RETROGRADE PYELOGRAM;  Surgeon: [name], MD;  Location: [facility];  Service: Urology;  Laterality: Left;  . Transanal resection of rectal polyp and sigmoid colectomy 03/2004  . Left hydrocele surgery 03/2009  . Cystoscopy with ureteral stent, left 06/2009     Social History: History   Social History  . Marital Status: Married    Spouse Name: N/A    Number of Children: N/A  . Years of Education: N/A   Occupational History  . Not on file.   Social History Main Topics   . Smoking status: Former Smoker -- 1.0 packs/day for 10 years    Quit date: 02/03/1974  . Smokeless tobacco: Never Used  . Alcohol Use: No  . Drug Use: No  . Sexually Active: No   Other Topics Concern  . Not on file   Social History Narrative   Married. Lives with wife. Independent of ADLs.    Family History:  Family History  Problem Relation Age of Onset  . Stroke Father 32  . Cancer Father     colon  . Lupus Daughter     Allergies: Review of patient's allergies indicates no known allergies.  Meds: Prior to Admission medications   Medication Sig Start Date End Date Taking? Authorizing Provider  lactose free nutrition (BOOST PLUS) LIQD Take 237 mLs by mouth at bedtime. 12/22/11  Yes Kathlen Mody, MD  Multiple Vitamin (MULTIVITAMIN WITH MINERALS) TABS Take 1 tablet by mouth daily.   Yes Historical Provider, MD  nutritional supplement (BOOST HIGH PROTEIN) LIQD Take 1 Can by mouth daily with breakfast. Per patient taking supplement that is in plastic bottle. Was not an option in database.   Yes Historical Provider, MD    Physical Exam: Filed Vitals:   01/09/12 1410  BP: 143/72  Pulse: 102  Temp: 98.1 F (36.7 C)  TempSrc: Oral  Height: 5\' 9"  (1.753 m)  Weight: 61.236  kg (135 lb)  SpO2: 100%     Physical Exam: Blood pressure 143/72, pulse 102, temperature 98.1 F (36.7 C), temperature source Oral, height 5\' 9"  (1.753 m), weight 61.236 kg (135 lb), SpO2 100.00%. Gen: No acute distress. Head: Normocephalic, atraumatic. Eyes: Pupils equally round, reactive to light and accommodation. Extraocular movements intact. Sclerae nonicteric. Mouth: Neck: Supple, no thyromegaly, no lymphadenopathy, no jugular venous distention. Chest: Lungs clear to auscultation bilaterally with good air movement. CV: Tachycardic but regular. No murmurs, rubs, or gallops. Abdomen: Soft, nontender, nondistended with normal active bowel sounds. Ileostomy noted right lower  quadrant. Genitourinary: Normal male. Indwelling Foley. Extremities: No clubbing, edema, or cyanosis. Skin: Warm and dry. No rashes. Neuro: Alert and oriented x3. Cranial nerves II through XII grossly intact. Nonfocal. Psych: Mood and affect normal.  Labs on Admission:  Basic Metabolic Panel:  Lab 01/09/12 1027  NA 135  K 5.8*  CL 107  CO2 11*  GLUCOSE 94  BUN 113*  CREATININE 6.32*  CALCIUM 10.1  MG --  PHOS --   CBC:  Lab 01/09/12 1611  WBC 8.9  NEUTROABS 7.5  HGB 9.8*  HCT 29.7*  MCV 89.7  PLT 472*    Radiological Exams on Admission: US Renal  01/09/2012  *RADIOLOGY REPORT*  Clinical Data: Elevated BUN, creatinine and potassium.  RENAL/URINARY TRACT ULTRASOUND COMPLETE  Comparison:  CT dated 12/21/2011.  CT dated 02/24/2011.  Renal ultrasound performed at [facility] on 11/21/2011.  Findings:  Right Kidney:  Dilated renal collecting system with the proximal portion of a ureteral stent visualized in the inferior pelvis and proximal ureter. The collecting system dilatation has progressed since the previous ultrasound.  Left Kidney:  Dilated renal collecting system with dilatation of the proximal ureter with the proximal portion of a ureteral stent seen within the proximal ureter.  The collecting system dilatation has progressed since the previous ultrasound.  Bladder:  Minimal urine in the bladder with a Foley catheter in place.  The distal portion of one or both of the ureteral stents is also seen in the bladder.  The previously seen malpositioning of the Foley catheter is no longer demonstrated.  The prostate gland is enlarged.  IMPRESSION:  1.  Moderate bilateral hydronephrosis with bilateral ureteral stents in place. 2.  Moderately enlarged prostate gland.   Original Report Authenticated By: Beckie Salts, M.D.     EKG: Independently reviewed. Normal sinus rhythm at 96 beats per minute.  Assessment/Plan Principal Problem:  *Acute renal failure in the  setting of indwelling ureteral stents and bilateral hydronephrosis   Admit and request interventional radiology place percutaneous nephrostomy tubes. Spoke with urologist who feels that these should be done percutaneously given the patient's extensive surgical history and history of rectovesicular fistula.  Hydrate with IV fluids containing bicarbonate given metabolic acidosis. Active Problems:  Stage IV colorectal cancer  We'll notify the patient's oncologist of his admission.  Colourethral fistula   The patient has an indwelling Foley catheter. He has surgery scheduled at [facility].  Hyperkalemia  Given 30 g of Kayexalate in the emergency department. Followup potassium in the morning. No EKG changes.  Metabolic acidosis  Change IV fluids to IV fluids containing sodium bicarbonate.  Normocytic anemia  Mild, likely anemia of chronic disease.  Code Status: Full. Family Communication: Wife at bedside. Disposition Plan: Home when stable.  Time spent: 1 hour.  RAMA,CHRISTINA [facility] Pager [phone]  If 7PM-7AM, please contact night-coverage [email or web address] Password [email or web address] 01/09/2012, 7:04 PM

## 2012-01-09 NOTE — ED Notes (Signed)
Attempted to call report to floor, RN unavailabe at this time

## 2012-01-09 NOTE — ED Notes (Signed)
Attempted to call report, receiving RN request to call back in 5 minutes

## 2012-01-09 NOTE — ED Notes (Signed)
Patient states his PCP called with abnormal labs- states his K+ and Creatinine were elevated.  Patient reports being run down but not really experiencing any symptoms.

## 2012-01-10 ENCOUNTER — Encounter (HOSPITAL_COMMUNITY): Payer: Self-pay | Admitting: Radiology

## 2012-01-10 ENCOUNTER — Inpatient Hospital Stay (HOSPITAL_COMMUNITY): Payer: BC Managed Care – PPO

## 2012-01-10 LAB — MRSA PCR SCREENING: MRSA by PCR: NEGATIVE

## 2012-01-10 LAB — APTT: aPTT: 39 seconds — ABNORMAL HIGH (ref 24–37)

## 2012-01-10 LAB — BASIC METABOLIC PANEL
Calcium: 9.3 mg/dL (ref 8.4–10.5)
Creatinine, Ser: 6.28 mg/dL — ABNORMAL HIGH (ref 0.50–1.35)
GFR calc Af Amer: 9 mL/min — ABNORMAL LOW (ref >90)
Sodium: 137 mEq/L (ref 135–145)

## 2012-01-10 LAB — PROTIME-INR: INR: 1.18 (ref 0.00–1.49)

## 2012-01-10 LAB — GLUCOSE, CAPILLARY: Glucose-Capillary: 101 mg/dL — ABNORMAL HIGH (ref 70–99)

## 2012-01-10 MED ORDER — IOHEXOL 300 MG/ML  SOLN
10.0000 mL | Freq: Once | INTRAMUSCULAR | Status: AC | PRN
  Administered 2012-01-10: 10 mL

## 2012-01-10 MED ORDER — SODIUM CHLORIDE 0.9 % IV BOLUS (SEPSIS)
2000.0000 mL | Freq: Once | INTRAVENOUS | Status: AC
  Administered 2012-01-10: 2000 mL via INTRAVENOUS

## 2012-01-10 MED ORDER — CIPROFLOXACIN IN D5W 400 MG/200ML IV SOLN
INTRAVENOUS | Status: AC
  Administered 2012-01-10: 14:00:00
  Filled 2012-01-10: qty 200

## 2012-01-10 MED ORDER — OXYCODONE HCL 5 MG PO TABS: 5.0000 mg | ORAL_TABLET | ORAL | Status: DC | PRN

## 2012-01-10 MED ORDER — METOPROLOL TARTRATE 1 MG/ML IV SOLN
5.0000 mg | INTRAVENOUS | Status: AC
  Administered 2012-01-10: 5 mg via INTRAVENOUS
  Filled 2012-01-10: qty 5

## 2012-01-10 MED ORDER — FENTANYL CITRATE 0.05 MG/ML IJ SOLN
INTRAMUSCULAR | Status: AC | PRN
  Administered 2012-01-10: 50 ug via INTRAVENOUS
  Administered 2012-01-10: 100 ug via INTRAVENOUS

## 2012-01-10 MED ORDER — CIPROFLOXACIN IN D5W 400 MG/200ML IV SOLN
400.0000 mg | Freq: Once | INTRAVENOUS | Status: AC
  Administered 2012-01-10: 400 mg via INTRAVENOUS

## 2012-01-10 MED ORDER — MIDAZOLAM HCL 2 MG/2ML IJ SOLN
INTRAMUSCULAR | Status: AC | PRN
  Administered 2012-01-10: 1 mg via INTRAVENOUS
  Administered 2012-01-10: 2 mg via INTRAVENOUS

## 2012-01-10 NOTE — Consult Note (Signed)
Eric Munoz 01/10/2012 Branon Sabine D Requesting Physician:  Dr Darnelle Catalan  Reason for Consult:  Acute on chronic renal failure HPI: The patient is a 71 y.o. year-old with hx of rectosigmoid cancer. He underwent LAR in 2006. Cancer recurred in May 2011 with associated hydronephrosis. He underwent a redo LAR which included a colostomy and ureteral transposition. He has had to have recurrent stenting procedures for bilat hydronephrosis, and has had bilat perc nephrostomies as well at one time in the past. He has an indwelling foley and a large colovesicular fistula. Lab work done at [facility] by Dr Geanie Berlin showed renal failure with creat of 6.13 and pt was sent to ED and admitted. US showed severe recurrent bilat hydro and urology has recommended perc nephrostomy tubes bilaterally which have just been placed today by IR.  Patient denies any symptoms or decrease in UOP. Says he voids about 1.5L during the day and about 800cc at night.      ROS  no f/c/s  no abd pain, n/v/d  no cp, sob or cough  no joint pain or skin rash  no ha, visual chg or confusion  no jerking of extremities  no focal weakness   Past Medical History:  Past Medical History  Diagnosis Date  . Easy bruising   . 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 S/P chemotherapy, time since greater than 12 weeks 03-2010 last chemo  . S/P radiation > 12 weeks 02-2010    last radiation  . Urinoma, s/p bladder repair 01/20/2011  . Difficult intubation   . History of acute renal failure nov 2012  . Hydronephrosis, bilateral     Status post surgical intervention  . Colon cancer 2007, recurrent Jul2011    Recurrent, Stage 4, [id number] (Left colectomy/LAR , ChemoTx completed Feb2012/ XRT completed Jan2012  . Colovesical fistula   . Iron deficiency anemia   . Renal insufficiency   . S/P ileostomy   . Foley catheter in place     Past Surgical History:  Past Surgical History   Procedure Date  . Portacath placement     09-17-09 right chest/ states removed 12-20-10  . Colostomy takedown 12/20/2010    Procedure: LAPAROSCOPIC COLOSTOMY TAKEDOWN;  Surgeon: [name], MD;  Location: [facility];  Service: General;  Laterality: N/A;  Laparoscopy Colostomy Takedownn With Ileostomy  . Port-a-cath removal 12/20/2010    Procedure: REMOVAL PORT-A-CATH;  Surgeon: [name], MD;  Location: [facility];  Service: General;  Laterality: Right;  Right  venous catheter no longer needed.  . Cystoscopy w/ ureteral stent placement 01/13/2011    Procedure: CYSTOSCOPY WITH RETROGRADE PYELOGRAM/URETERAL STENT PLACEMENT;  Surgeon: [name];  Location: [facility];  Service: Urology;  Laterality: Left;  intra-operative cystogram  . Tonsillectomy yrs ago  . Colon surgery 08/07/09    Low Anterior Rescection, resection of L ext iliac artery,  ureterolysis  . Fracture surgery 1950'S    RIGHT ARM SURGERY FOR FX  . Examination under anesthesia 06/19/2011    Procedure: EXAM UNDER ANESTHESIA;  Surgeon: [name], MD;  Location: [facility];  Service: General;;  . Cystoscopy w/ ureteral stent placement 06/19/2011    Procedure: CYSTOSCOPY WITH RETROGRADE PYELOGRAM/URETERAL STENT PLACEMENT;  Surgeon: [name], MD;  Location: [facility];  Service: Urology;  Laterality: Bilateral;  Cystoscopy/Bilateral Double J Stent insertion , flexible cystoscopy   . Cystoscopy w/ ureteral stent placement 10/24/2011    Procedure:  CYSTOSCOPY WITH STENT REPLACEMENT;  Surgeon: [name], MD;  Location[facility];  Service: Urology;  Laterality: Bilateral;  45 mins requested for this case   . Cystoscopy w/ retrogrades 10/24/2011    Procedure: CYSTOSCOPY WITH RETROGRADE PYELOGRAM;  Surgeon: [name], MD;  Location: [facility];  Service: Urology;  Laterality: Left;  . Transanal resection of rectal polyp and sigmoid colectomy 03/2004  . Left hydrocele surgery 03/2009  .  Cystoscopy with ureteral stent, left 06/2009    Family History:  Family History  Problem Relation Age of Onset  . Stroke Father 64  . Cancer Father     colon  . Lupus Daughter    Social History:  reports that he quit smoking about 37 years ago. He has never used smokeless tobacco. He reports that he does not drink alcohol or use illicit drugs.  Allergies: No Known Allergies  Home medications: Prior to Admission medications   Medication Sig Start Date End Date Taking? Authorizing Provider  lactose free nutrition (BOOST PLUS) LIQD Take 237 mLs by mouth at bedtime. 12/22/11  Yes Kathlen Mody, MD  Multiple Vitamin (MULTIVITAMIN WITH MINERALS) TABS Take 1 tablet by mouth daily.   Yes Historical Provider, MD  nutritional supplement (BOOST HIGH PROTEIN) LIQD Take 1 Can by mouth daily with breakfast. Per patient taking supplement that is in plastic bottle. Was not an option in database.   Yes Historical Provider, MD    Inpatient medications:    . ciprofloxacin      . [COMPLETED] ciprofloxacin  400 mg Intravenous Once  . feeding supplement  237 mL Oral QHS  . lactose free nutrition  237 mL Oral QHS  . multivitamin with minerals  1 tablet Oral Daily  . [COMPLETED] sodium polystyrene  30 g Oral Once  . [DISCONTINUED] feeding supplement  1 Container Oral Q24H    Labs: Basic Metabolic Panel:  Lab 01/10/12 1610 01/09/12 1611  NA 137 135  K 4.2 5.8*  CL 105 107  CO2 17* 11*  GLUCOSE 102* 94  BUN 109* 113*  CREATININE 6.28* 6.32*  ALB -- --  CALCIUM 9.3 10.1  PHOS -- 6.0*   Liver Function Tests:  Lab 01/09/12 1611  AST 19  ALT 31  ALKPHOS 116  BILITOT 0.2*  PROT 7.8  ALBUMIN 3.2*   No results found for this basename: LIPASE:3,AMYLASE:3 in the last 168 hours No results found for this basename: AMMONIA:3 in the last 168 hours CBC:  Lab 01/09/12 1611  WBC 8.9  NEUTROABS 7.5  HGB 9.8*  HCT 29.7*  MCV 89.7  PLT 472*   PT/INR: @labrcntip (inr:5) Cardiac Enzymes: No  results found for this basename: CKTOTAL:5,CKMB:5,CKMBINDEX:5,TROPONINI:5 in the last 168 hours CBG:  Lab 01/10/12 0521 01/10/12 0158  GLUCAP 100* 101*    Iron Studies: No results found for this basename: IRON:30,TIBC:30,TRANSFERRIN:30,FERRITIN:30 in the last 168 hours  Xrays/Other Studies: US Renal  01/09/2012  *RADIOLOGY REPORT*  Clinical Data: Elevated BUN, creatinine and potassium.  RENAL/URINARY TRACT ULTRASOUND COMPLETE  Comparison:  CT dated 12/21/2011.  CT dated 02/24/2011.  Renal ultrasound performed at [facility] on 11/21/2011.  Findings:  Right Kidney:  Dilated renal collecting system with the proximal portion of a ureteral stent visualized in the inferior pelvis and proximal ureter. The collecting system dilatation has progressed since the previous ultrasound.  Left Kidney:  Dilated renal collecting system with dilatation of the proximal ureter with the proximal portion of a ureteral stent seen within  the proximal ureter.  The collecting system dilatation has progressed since the previous ultrasound.  Bladder:  Minimal urine in the bladder with a Foley catheter in place.  The distal portion of one or both of the ureteral stents is also seen in the bladder.  The previously seen malpositioning of the Foley catheter is no longer demonstrated.  The prostate gland is enlarged.  IMPRESSION:  1.  Moderate bilateral hydronephrosis with bilateral ureteral stents in place. 2.  Moderately enlarged prostate gland.   Original Report Authenticated By: Beckie Salts, M.D.     Physical Exam:  Blood pressure 123/67, pulse 99, temperature 99.5 F (37.5 C), temperature source Oral, resp. rate 11, height 5\' 9"  (1.753 m), weight 58.2 kg (128 lb 4.9 oz), SpO2 99.00%.  Gen: thin WM in no distress Skin: no rash, cyanosis HEENT:  EOMI, sclera anicteric, throat slightly dry Neck: flat neck veins, no JVD, no bruits or LAN Chest: clear bilat throughout CV: regular, no rub or gallop, no carotid  or femoral bruits, pedal pulses diminished at 1+ bilat Abdomen: soft, many well-healed scars, RLQ ileostomy with bag in place, bilat perc neph tubes in lower flanks draining cloudy blood tinged fluid/urine Ext: no LE or UE edema, no joint effusion or deformity, no gangrene or ulceration Neuro: alert, Ox3, no focal deficit, no asterixis, fully appropriate   Impression/Plan 1. Acute renal failure due to obstruction, in complicated pt with hx of rectosigmoid cancer s/p LAR x 2 with indwelling JJ ureteral stents which are apparently not functioning.  Has had bilat perc neph tubes placed today; he needs fluid also, will increase IVF's. Avoid nephrotoxins.   2. Hx rectosigmoid Ca with recurrence 2011- see above 3. R ileostomy 4. CKD ?baseline creat anywhere from 1.0-2.5  Will follow.   Vinson Moselle  MD [employer] [phone] pgr    [phone] cell 01/10/2012, 3:12 PM

## 2012-01-10 NOTE — Consult Note (Signed)
Urology Consult  Referring physician: Salome Arnt, M.D. Reason for referral: Bilateral hydroureteronephrosis with acute renal failure and history of GI GU fistula  History of Present Illness: Eric Munoz has a very complicated prior urologic history. He has a large GI GU fistula with a chronic indwelling Foley catheter and chronic indwelling double-J stent. The patient is scheduled to undergo a major reconstructive surgical procedure at [facility] in the near future. The patient has had indwelling stents put in by Dr. Rodman Pickle stent. This become very difficult endoscopically due to a large fistula and significant visualization problems. The patient recently was in the ER where he had malpositioning of his Foley catheter into the rectum and this required my consultation and readjustment of the catheter. The patient was known recently to have had substantial worsening of his renal function with evidence of bilateral hydronephrosis. His primary urologist Dr. Patsi Sears inform me that the patient may be getting admitted. He suggested that the best approach would be bilateral percutaneous nephrostomy tube placement which we discussed with the hospitalist service as well as the interventional radiology service. The patient otherwise has no new complaints or symptoms today. His Foley catheters been draining well.  Past Medical History  Diagnosis Date  . Easy bruising   . 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 S/P chemotherapy, time since greater than 12 weeks 03-2010 last chemo  . S/P radiation > 12 weeks 02-2010    last radiation  . Urinoma, s/p bladder repair 01/20/2011  . Difficult intubation   . History of acute renal failure nov 2012  . Hydronephrosis, bilateral     Status post surgical intervention  . 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 Colovesical fistula   . Iron deficiency anemia   . Renal insufficiency   . S/P ileostomy   . Foley catheter in place    Past Surgical History  Procedure Date  . Portacath placement     09-17-09 right chest/ states removed 12-20-10  . Colostomy takedown 12/20/2010    Procedure: LAPAROSCOPIC COLOSTOMY TAKEDOWN;  Surgeon: [name], MD;  Location: [facility];  Service: General;  Laterality: N/A;  Laparoscopy Colostomy Takedownn With Ileostomy  . Port-a-cath removal 12/20/2010    Procedure: REMOVAL PORT-A-CATH;  Surgeon: [name], MD;  Location: [facility];  Service: General;  Laterality: Right;  Right  venous catheter no longer needed.  . Cystoscopy w/ ureteral stent placement 01/13/2011    Procedure: CYSTOSCOPY WITH RETROGRADE PYELOGRAM/URETERAL STENT PLACEMENT;  Surgeon: [name];  Location: [facility];  Service: Urology;  Laterality: Left;  intra-operative cystogram  . Tonsillectomy yrs ago  . Colon surgery 08/07/09    Low Anterior Rescection, resection of L ext iliac artery,  ureterolysis  . Fracture surgery 1950'S    RIGHT ARM SURGERY FOR FX  . Examination under anesthesia 06/19/2011    Procedure: EXAM UNDER ANESTHESIA;  Surgeon: [name], MD;  Location: [facility];  Service: General;;  . Cystoscopy w/ ureteral stent placement 06/19/2011    Procedure: CYSTOSCOPY WITH RETROGRADE PYELOGRAM/URETERAL STENT PLACEMENT;  Surgeon: [name], MD;  Location: [facility];  Service: Urology;  Laterality: Bilateral;  Cystoscopy/Bilateral Double J Stent insertion , flexible cystoscopy   . Cystoscopy w/ ureteral stent placement 10/24/2011    Procedure: CYSTOSCOPY WITH STENT REPLACEMENT;  Surgeon: [name], MD;  Location:  [facility];  Service: Urology;  Laterality: Bilateral;  45 mins requested for this case   . Cystoscopy w/ retrogrades 10/24/2011    Procedure: CYSTOSCOPY WITH RETROGRADE PYELOGRAM;  Surgeon: [name], MD;  Location: [facility];  Service: Urology;  Laterality: Left;  . Transanal resection of rectal polyp and sigmoid colectomy 03/2004  . Left hydrocele surgery 03/2009  . Cystoscopy with ureteral stent, left 06/2009    Medications:  Scheduled:   . feeding supplement  237 mL Oral QHS  . lactose free nutrition  237 mL Oral QHS  . multivitamin with minerals  1 tablet Oral Daily  . [COMPLETED] sodium polystyrene  30 g Oral Once  . [DISCONTINUED] feeding supplement  1 Container Oral Q24H    Allergies: No Known Allergies  Family History  Problem Relation Age of Onset  . Stroke Father 95  . Cancer Father     colon  . Lupus Daughter     Social History:  reports that he quit smoking about 37 years ago. He has never used smokeless tobacco. He reports that he does not drink alcohol or use illicit drugs.  @ROS @  Physical Exam:  Vital signs in last 24 hours: Temp:  [97.7 F (36.5 C)-99.5 F (37.5 C)] 99.5 F (37.5 C) (12/07 0517) Pulse Rate:  [102-116] 108  (12/07 0517) Resp:  [15-20] 16  (12/07 0517) BP: (126-143)/(64-77) 126/64 mmHg (12/07 0517) SpO2:  [99 %-100 %] 99 % (12/07 0517) Weight:  [58.1 kg (128 lb 1.4 oz)-61.236 kg (135 lb)] 58.2 kg (128 lb 4.9 oz) (12/07 0530)  Constitutional: Vital signs reviewed. WD WN in NAD Head: Normocephalic and atraumatic   Eyes: PERRL, No scleral icterus.  Neck: Supple No  Gross JVD, mass, thyromegaly, or carotid bruit present.  Cardiovascular: RRR Pulmonary/Chest: Normal effort Abdominal: Soft. Non-tender, non-distended, bowel sounds are normal, no masses, organomegaly, or guarding present.  Genitourinary: Extremities: No cyanosis or edema  Neurological: Grossly non-focal.  Skin: Warm,very dry and intact. No rash, cyanosis   Laboratory Data:  Results for orders placed during the hospital encounter of 01/09/12 (from the past 72 hour(s))  BASIC METABOLIC PANEL     Status: Abnormal   Collection Time   01/09/12  4:11 PM      Component Value Range Comment    Sodium 135  135 - 145 mEq/L    Potassium 5.8 (*) 3.5 - 5.1 mEq/L    Chloride 107  96 - 112 mEq/L    CO2 11 (*) 19 - 32 mEq/L    Glucose, Bld 94  70 - 99 mg/dL    BUN 960 (*) 6 - 23 mg/dL    Creatinine, Ser 4.54 (*) 0.50 - 1.35 mg/dL    Calcium 09.8  8.4 - 10.5 mg/dL    GFR calc non Af Amer 8 (*) >90 mL/min    GFR calc Af Amer 9 (*) >90 mL/min   CBC WITH DIFFERENTIAL     Status: Abnormal   Collection Time   01/09/12  4:11 PM      Component Value Range Comment   WBC 8.9  4.0 - 10.5 K/uL    RBC 3.31 (*) 4.22 - 5.81 MIL/uL    Hemoglobin 9.8 (*) 13.0 - 17.0 g/dL    HCT 11.9 (*) 14.7 - 52.0 %    MCV 89.7  78.0 - 100.0 fL    MCH 29.6  26.0 - 34.0 pg    MCHC 33.0  30.0 - 36.0  g/dL    RDW 30.8  65.7 - 84.6 %    Platelets 472 (*) 150 - 400 K/uL    Neutrophils Relative 84 (*) 43 - 77 %    Neutro Abs 7.5  1.7 - 7.7 K/uL    Lymphocytes Relative 5 (*) 12 - 46 %    Lymphs Abs 0.4 (*) 0.7 - 4.0 K/uL    Monocytes Relative 10  3 - 12 %    Monocytes Absolute 0.9  0.1 - 1.0 K/uL    Eosinophils Relative 1  0 - 5 %    Eosinophils Absolute 0.1  0.0 - 0.7 K/uL    Basophils Relative 0  0 - 1 %    Basophils Absolute 0.0  0.0 - 0.1 K/uL   HEPATIC FUNCTION PANEL     Status: Abnormal   Collection Time   01/09/12  4:11 PM      Component Value Range Comment   Total Protein 7.8  6.0 - 8.3 g/dL    Albumin 3.2 (*) 3.5 - 5.2 g/dL    AST 19  0 - 37 U/L    ALT 31  0 - 53 U/L    Alkaline Phosphatase 116  39 - 117 U/L    Total Bilirubin 0.2 (*) 0.3 - 1.2 mg/dL    Bilirubin, Direct 0.1  0.0 - 0.3 mg/dL    Indirect Bilirubin 0.1 (*) 0.3 - 0.9 mg/dL   MAGNESIUM     Status: Normal   Collection Time   01/09/12  4:11 PM      Component Value Range Comment   Magnesium 2.4  1.5 - 2.5 mg/dL   PHOSPHORUS     Status: Abnormal   Collection Time   01/09/12  4:11 PM      Component Value Range Comment   Phosphorus 6.0 (*) 2.3 - 4.6 mg/dL   GLUCOSE, CAPILLARY     Status: Abnormal   Collection Time   01/10/12  1:58 AM       Component Value Range Comment   Glucose-Capillary 101 (*) 70 - 99 mg/dL   GLUCOSE, CAPILLARY     Status: Abnormal   Collection Time   01/10/12  5:21 AM      Component Value Range Comment   Glucose-Capillary 100 (*) 70 - 99 mg/dL    Comment 1 Notify RN     MRSA PCR SCREENING     Status: Normal   Collection Time   01/10/12  5:43 AM      Component Value Range Comment   MRSA by PCR NEGATIVE  NEGATIVE   PROTIME-INR     Status: Normal   Collection Time   01/10/12  6:43 AM      Component Value Range Comment   Prothrombin Time 14.8  11.6 - 15.2 seconds    INR 1.18  0.00 - 1.49   APTT     Status: Abnormal   Collection Time   01/10/12  6:43 AM      Component Value Range Comment   aPTT 39 (*) 24 - 37 seconds   BASIC METABOLIC PANEL     Status: Abnormal   Collection Time   01/10/12  6:43 AM      Component Value Range Comment   Sodium 137  135 - 145 mEq/L    Potassium 4.2  3.5 - 5.1 mEq/L DELTA CHECK NOTED   Chloride 105  96 - 112 mEq/L    CO2 17 (*) 19 - 32 mEq/L  Glucose, Bld 102 (*) 70 - 99 mg/dL    BUN 846 (*) 6 - 23 mg/dL    Creatinine, Ser 9.62 (*) 0.50 - 1.35 mg/dL    Calcium 9.3  8.4 - 95.2 mg/dL    GFR calc non Af Amer 8 (*) >90 mL/min    GFR calc Af Amer 9 (*) >90 mL/min    Recent Results (from the past 240 hour(s))  MRSA PCR SCREENING     Status: Normal   Collection Time   01/10/12  5:43 AM      Component Value Range Status Comment   MRSA by PCR NEGATIVE  NEGATIVE Final    Creatinine:  Basename 01/10/12 0643 01/09/12 1611  CREATININE 6.28* 6.32*   Baseline Creatinine:   Impression/Assessment:  Acute renal failure due to obstructive uropathy. The patient has chronic indwelling stents which are clearly not adequately draining the kidneys. He will have bilateral percutaneous nephrostomy tubes placed today. We should see significant improvement in his renal function over the next 48 hours. We will continue to follow them.  Plan:  As above  Virgel Haro  S 01/10/2012, 11:47 AM

## 2012-01-10 NOTE — Procedures (Signed)
Post-Procedure Note  Pre-operative Diagnosis:Bilateral hydronephrosis and worsening renal function       Post-operative Diagnosis: concern for pyonephrosis   Indications: Worsening renal function Procedure Details:   Placement of bilateral nephrostomy tubes.  Findings: Moderate hydronephrosis.  Yellow cloudy/purulent fluid removed from both kidneys, concerning for pyonephrosis.  Complications: None     Condition: Stable  Plan: follow output and renal function.  Send fluid for culture and gram stain.

## 2012-01-10 NOTE — Progress Notes (Signed)
[facility] PROGRESS NOTE  Eric Munoz ZOX:[id number] DOB: [birth year] DOA: 01/09/2012 PCP: No primary provider on file.  Brief narrative: Eric Munoz is an 71 y.o. male with a PMH rectosigmoid cancer status post multiple surgeries. He was originally diagnosed 03/2004 and had a transanal resection of rectal polyp and a sigmoid colectomy at that time. He had a recurrent mass noted 06/2009 when he presented with abdominal pain and left hydronephrosis. A double-J stent was placed. He underwent a repeat colonoscopy with biopsies which confirmed recurrent malignancy versus a new primary. He subsequently underwent exploratory laparotomy, lysis of adhesions, redo low anterior resection, En-bloc resection with psoas muscle, left external iliac artery, ileum with end-descending colostomy; Left iliac internal and external iliac lymphadenectomy including paraaortic lymphadenectomy; Left ureterolysis and ureter transposition; Left en-bloc resection of left external iliac artery with left hypogastric artery/internal iliac transposition reconstruction and splenic flexure mobilization. He underwent an exploratory laparotomy, evacuation of hematoma, completion proctectomy and left ureter to right psoas hitching the next day. He has had to have recurrent stents placed by the urologist for recurrent hydronephrosis. 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 He apparently had labs drawn by his nephrologist yesterday, and was advised to come to the hospital to evaluate abnormalities found on his lab work. Lab work here showed acute renal failure with a creatinine of 6.13 and hyperkalemia with potassium of 5.8. He also had metabolic acidosis with a bicarbonate of 13. The patient himself was relatively asymptomatic except for some mild generalized weakness. The patient has had good urine output through his indwelling Foley catheter. He is being taken to interventional radiology for  percutaneous nephrostomy tubes today.  Assessment/Plan: Principal Problem:  *Acute renal failure in the setting of indwelling ureteral stents and bilateral hydronephrosis   Admitted and requested interventional radiology place percutaneous nephrostomy tubes. Spoke with urologist who feels that these should be done percutaneously given the patient's extensive surgical history and history of rectovesicular fistula. Percutaneous nephrostomy tubes to be placed this afternoon.  Continue to hydrate with IV fluids containing bicarbonate given metabolic acidosis, which is improving. Creatinine essentially unchanged. Active Problems:  Stage IV colorectal cancer   Dr. Arline Asp is aware of the patient's admission. Colourethral fistula   The patient has an indwelling Foley catheter. He has surgery scheduled at [facility]. Hyperkalemia   Given 30 g of Kayexalate in the emergency department. Followup potassium within normal limits. No EKG changes. Metabolic acidosis   Continue IV fluids containing sodium bicarbonate. Normocytic anemia   Mild, likely anemia of chronic disease.  Code Status: Full.  Family Communication: Wife at bedside.  Disposition Plan: Home when stable.  Medical Consultants:    Other Consultants:  Dr. Barron Alvine, urology.  Anti-infectives:  Cipro 01/10/2012--->  HPI/Subjective: Eric Munoz feels better. He denies any shortness of breath, dizziness, fluid retention, chest pain, or other symptoms. He is hungry.  Objective: Filed Vitals:   01/09/12 2030 01/09/12 2205 01/10/12 0517 01/10/12 0530  BP: 130/75 133/77 126/64   Pulse: 103 102 108   Temp:  98.5 F (36.9 C) 99.5 F (37.5 C)   TempSrc:  Oral Oral   Resp: 20 18 16    Height:  5\' 9"  (1.753 m)    Weight:  58.1 kg (128 lb 1.4 oz)  58.2 kg (128 lb 4.9 oz)  SpO2: 100% 99% 99%     Intake/Output Summary (Last 24 hours) at 01/10/12 1353 Last data filed at 01/10/12 0600  Gross per 24  hour  Intake 1089.58 ml   Output    350 ml  Net 739.58 ml    Exam: Gen:  NAD Cardiovascular:  RRR, No M/R/G Respiratory:  Lungs CTAB Gastrointestinal:  Abdomen soft, NT/ND, + BS Extremities:  No C/E/C  Data Reviewed: Basic Metabolic Panel:  Lab 01/10/12 1610 01/09/12 1611  NA 137 135  K 4.2 5.8*  CL 105 107  CO2 17* 11*  GLUCOSE 102* 94  BUN 109* 113*  CREATININE 6.28* 6.32*  CALCIUM 9.3 10.1  MG -- 2.4  PHOS -- 6.0*   GFR Estimated Creatinine Clearance: 8.9 ml/min (by C-G formula based on Cr of 6.28). Liver Function Tests:  Lab 01/09/12 1611  AST 19  ALT 31  ALKPHOS 116  BILITOT 0.2*  PROT 7.8  ALBUMIN 3.2*   Coagulation profile  Lab 01/10/12 0643  INR 1.18  PROTIME --    CBC:  Lab 01/09/12 1611  WBC 8.9  NEUTROABS 7.5  HGB 9.8*  HCT 29.7*  MCV 89.7  PLT 472*   CBG:  Lab 01/10/12 0521 01/10/12 0158  GLUCAP 100* 101*   Microbiology Recent Results (from the past 240 hour(s))  MRSA PCR SCREENING     Status: Normal   Collection Time   01/10/12  5:43 AM      Component Value Range Status Comment   MRSA by PCR NEGATIVE  NEGATIVE Final      Procedures and Diagnostic Studies: US Renal  01/09/2012  *RADIOLOGY REPORT*  Clinical Data: Elevated BUN, creatinine and potassium.  RENAL/URINARY TRACT ULTRASOUND COMPLETE  Comparison:  CT dated 12/21/2011.  CT dated 02/24/2011.  Renal ultrasound performed at [facility] on 11/21/2011.  Findings:  Right Kidney:  Dilated renal collecting system with the proximal portion of a ureteral stent visualized in the inferior pelvis and proximal ureter. The collecting system dilatation has progressed since the previous ultrasound.  Left Kidney:  Dilated renal collecting system with dilatation of the proximal ureter with the proximal portion of a ureteral stent seen within the proximal ureter.  The collecting system dilatation has progressed since the previous ultrasound.  Bladder:  Minimal urine in the bladder with a Foley catheter in  place.  The distal portion of one or both of the ureteral stents is also seen in the bladder.  The previously seen malpositioning of the Foley catheter is no longer demonstrated.  The prostate gland is enlarged.  IMPRESSION:  1.  Moderate bilateral hydronephrosis with bilateral ureteral stents in place. 2.  Moderately enlarged prostate gland.   Original Report Authenticated By: Beckie Salts, M.D.     Scheduled Meds:   . ciprofloxacin      . ciprofloxacin  400 mg Intravenous Once  . feeding supplement  237 mL Oral QHS  . lactose free nutrition  237 mL Oral QHS  . multivitamin with minerals  1 tablet Oral Daily  . [COMPLETED] sodium polystyrene  30 g Oral Once  . [DISCONTINUED] feeding supplement  1 Container Oral Q24H   Continuous Infusions:   .  sodium bicarbonate infusion 1000 mL 125 mL/hr at 01/10/12 0517    Time spent: 25 minutes.   LOS: 1 day   Daquana Paddock  [facility] Pager [phone].  If 8PM-8AM, please contact night-coverage at [email or web address], password [facility] 01/10/2012, 1:53 PM

## 2012-01-10 NOTE — H&P (Signed)
Referring Physician:Rama/Grapey HPI: Eric Munoz is an 71 y.o. male with acute renal failure due to obstructive uropathy. He has indwelling stents, but has developed bilateral hydroureteronephrosis. Request for bilat perc nephrostomies. PMHx and meds reviewed as below.  Past Medical History:  Past Medical History  Diagnosis Date  . Easy bruising   . 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 S/P chemotherapy, time since greater than 12 weeks 03-2010 last chemo  . S/P radiation > 12 weeks 02-2010    last radiation  . Urinoma, s/p bladder repair 01/20/2011  . Difficult intubation   . History of acute renal failure nov 2012  . Hydronephrosis, bilateral     Status post surgical intervention  . Colon cancer 2007, recurrent Jul2011    Recurrent, Stage 4, [id number] (Left colectomy/LAR , ChemoTx completed Feb2012/ XRT completed Jan2012  . Colovesical fistula   . Iron deficiency anemia   . Renal insufficiency   . S/P ileostomy   . Foley catheter in place     Past Surgical History:  Past Surgical History  Procedure Date  . Portacath placement     09-17-09 right chest/ states removed 12-20-10  . Colostomy takedown 12/20/2010    Procedure: LAPAROSCOPIC COLOSTOMY TAKEDOWN;  Surgeon: [name], MD;  Location: [facility];  Service: General;  Laterality: N/A;  Laparoscopy Colostomy Takedownn With Ileostomy  . Port-a-cath removal 12/20/2010    Procedure: REMOVAL PORT-A-CATH;  Surgeon: [name], MD;  Location: [facility];  Service: General;  Laterality: Right;  Right  venous catheter no longer needed.  . Cystoscopy w/ ureteral stent placement 01/13/2011    Procedure: CYSTOSCOPY WITH RETROGRADE PYELOGRAM/URETERAL STENT PLACEMENT;  Surgeon: [name];  Location: [facility];  Service: Urology;  Laterality: Left;  intra-operative cystogram  . Tonsillectomy yrs ago  . Colon surgery 08/07/09    Low Anterior Rescection, resection of L ext  iliac artery,  ureterolysis  . Fracture surgery 1950'S    RIGHT ARM SURGERY FOR FX  . Examination under anesthesia 06/19/2011    Procedure: EXAM UNDER ANESTHESIA;  Surgeon: [name], MD;  Location: [facility];  Service: General;;  . Cystoscopy w/ ureteral stent placement 06/19/2011    Procedure: CYSTOSCOPY WITH RETROGRADE PYELOGRAM/URETERAL STENT PLACEMENT;  Surgeon: [name], MD;  Location: [facility];  Service: Urology;  Laterality: Bilateral;  Cystoscopy/Bilateral Double J Stent insertion , flexible cystoscopy   . Cystoscopy w/ ureteral stent placement 10/24/2011    Procedure: CYSTOSCOPY WITH STENT REPLACEMENT;  Surgeon: [name], MD;  Location: [facility];  Service: Urology;  Laterality: Bilateral;  45 mins requested for this case   . Cystoscopy w/ retrogrades 10/24/2011    Procedure: CYSTOSCOPY WITH RETROGRADE PYELOGRAM;  Surgeon: [name], MD;  Location: [facility];  Service: Urology;  Laterality: Left;  . Transanal resection of rectal polyp and sigmoid colectomy 03/2004  . Left hydrocele surgery 03/2009  . Cystoscopy with ureteral stent, left 06/2009    Family History:  Family History  Problem Relation Age of Onset  . Stroke Father 70  . Cancer Father     colon  . Lupus Daughter     Social History:  reports that he quit smoking about 37 years ago. He has never used smokeless tobacco. He reports that he does not drink alcohol or use illicit drugs.  Allergies: No Known Allergies  Medications: Medications Prior to Admission  Medication Sig Dispense Refill  .  lactose free nutrition (BOOST PLUS) LIQD Take 237 mLs by mouth at bedtime.      . Multiple Vitamin (MULTIVITAMIN WITH MINERALS) TABS Take 1 tablet by mouth daily.      . nutritional supplement (BOOST HIGH PROTEIN) LIQD Take 1 Can by mouth daily with breakfast. Per patient taking supplement that is in plastic bottle. Was not an option in database.        Please HPI for  pertinent positives, otherwise complete 10 system ROS negative.  Physical Exam: Blood pressure 126/64, pulse 108, temperature 99.5 F (37.5 C), temperature source Oral, resp. rate 16, height 5\' 9"  (1.753 m), weight 128 lb 4.9 oz (58.2 kg), SpO2 99.00%. Body mass index is 18.95 kg/(m^2).   General Appearance:  Alert, cooperative, no distress, appears stated age  Head:  Normocephalic, without obvious abnormality, atraumatic  ENT: Unremarkable  Neck: Supple, symmetrical, trachea midline, no adenopathy, thyroid: not enlarged, symmetric, no tenderness/mass/nodules  Lungs:   Clear to auscultation bilaterally, no w/r/r, respirations unlabored without use of accessory muscles.  Chest Wall:  No tenderness or deformity  Heart:  Regular rate and rhythm, S1, S2 normal, no murmur, rub or gallop. Carotids 2+ without bruit.  Abdomen:   Soft, non-tender, non distended. RLQ ostomy, bag empty  Neurologic: Normal affect, no gross deficits.   Results for orders placed during the hospital encounter of 01/09/12 (from the past 48 hour(s))  BASIC METABOLIC PANEL     Status: Abnormal   Collection Time   01/09/12  4:11 PM      Component Value Range Comment   Sodium 135  135 - 145 mEq/L    Potassium 5.8 (*) 3.5 - 5.1 mEq/L    Chloride 107  96 - 112 mEq/L    CO2 11 (*) 19 - 32 mEq/L    Glucose, Bld 94  70 - 99 mg/dL    BUN 409 (*) 6 - 23 mg/dL    Creatinine, Ser 8.11 (*) 0.50 - 1.35 mg/dL    Calcium 91.4  8.4 - 10.5 mg/dL    GFR calc non Af Amer 8 (*) >90 mL/min    GFR calc Af Amer 9 (*) >90 mL/min   CBC WITH DIFFERENTIAL     Status: Abnormal   Collection Time   01/09/12  4:11 PM      Component Value Range Comment   WBC 8.9  4.0 - 10.5 K/uL    RBC 3.31 (*) 4.22 - 5.81 MIL/uL    Hemoglobin 9.8 (*) 13.0 - 17.0 g/dL    HCT 78.2 (*) 95.6 - 52.0 %    MCV 89.7  78.0 - 100.0 fL    MCH 29.6  26.0 - 34.0 pg    MCHC 33.0  30.0 - 36.0 g/dL    RDW 21.3  08.6 - 57.8 %    Platelets 472 (*) 150 - 400 K/uL     Neutrophils Relative 84 (*) 43 - 77 %    Neutro Abs 7.5  1.7 - 7.7 K/uL    Lymphocytes Relative 5 (*) 12 - 46 %    Lymphs Abs 0.4 (*) 0.7 - 4.0 K/uL    Monocytes Relative 10  3 - 12 %    Monocytes Absolute 0.9  0.1 - 1.0 K/uL    Eosinophils Relative 1  0 - 5 %    Eosinophils Absolute 0.1  0.0 - 0.7 K/uL    Basophils Relative 0  0 - 1 %    Basophils Absolute 0.0  0.0 - 0.1 K/uL   HEPATIC FUNCTION PANEL     Status: Abnormal   Collection Time   01/09/12  4:11 PM      Component Value Range Comment   Total Protein 7.8  6.0 - 8.3 g/dL    Albumin 3.2 (*) 3.5 - 5.2 g/dL    AST 19  0 - 37 U/L    ALT 31  0 - 53 U/L    Alkaline Phosphatase 116  39 - 117 U/L    Total Bilirubin 0.2 (*) 0.3 - 1.2 mg/dL    Bilirubin, Direct 0.1  0.0 - 0.3 mg/dL    Indirect Bilirubin 0.1 (*) 0.3 - 0.9 mg/dL   MAGNESIUM     Status: Normal   Collection Time   01/09/12  4:11 PM      Component Value Range Comment   Magnesium 2.4  1.5 - 2.5 mg/dL   PHOSPHORUS     Status: Abnormal   Collection Time   01/09/12  4:11 PM      Component Value Range Comment   Phosphorus 6.0 (*) 2.3 - 4.6 mg/dL   GLUCOSE, CAPILLARY     Status: Abnormal   Collection Time   01/10/12  1:58 AM      Component Value Range Comment   Glucose-Capillary 101 (*) 70 - 99 mg/dL   GLUCOSE, CAPILLARY     Status: Abnormal   Collection Time   01/10/12  5:21 AM      Component Value Range Comment   Glucose-Capillary 100 (*) 70 - 99 mg/dL    Comment 1 Notify RN     MRSA PCR SCREENING     Status: Normal   Collection Time   01/10/12  5:43 AM      Component Value Range Comment   MRSA by PCR NEGATIVE  NEGATIVE   PROTIME-INR     Status: Normal   Collection Time   01/10/12  6:43 AM      Component Value Range Comment   Prothrombin Time 14.8  11.6 - 15.2 seconds    INR 1.18  0.00 - 1.49   APTT     Status: Abnormal   Collection Time   01/10/12  6:43 AM      Component Value Range Comment   aPTT 39 (*) 24 - 37 seconds   BASIC METABOLIC PANEL     Status:  Abnormal   Collection Time   01/10/12  6:43 AM      Component Value Range Comment   Sodium 137  135 - 145 mEq/L    Potassium 4.2  3.5 - 5.1 mEq/L DELTA CHECK NOTED   Chloride 105  96 - 112 mEq/L    CO2 17 (*) 19 - 32 mEq/L    Glucose, Bld 102 (*) 70 - 99 mg/dL    BUN 161 (*) 6 - 23 mg/dL    Creatinine, Ser 0.96 (*) 0.50 - 1.35 mg/dL    Calcium 9.3  8.4 - 04.5 mg/dL    GFR calc non Af Amer 8 (*) >90 mL/min    GFR calc Af Amer 9 (*) >90 mL/min    US Renal  01/09/2012  *RADIOLOGY REPORT*  Clinical Data: Elevated BUN, creatinine and potassium.  RENAL/URINARY TRACT ULTRASOUND COMPLETE  Comparison:  CT dated 12/21/2011.  CT dated 02/24/2011.  Renal ultrasound performed at [facility] on 11/21/2011.  Findings:  Right Kidney:  Dilated renal collecting system with the proximal portion of a ureteral stent visualized in the  inferior pelvis and proximal ureter. The collecting system dilatation has progressed since the previous ultrasound.  Left Kidney:  Dilated renal collecting system with dilatation of the proximal ureter with the proximal portion of a ureteral stent seen within the proximal ureter.  The collecting system dilatation has progressed since the previous ultrasound.  Bladder:  Minimal urine in the bladder with a Foley catheter in place.  The distal portion of one or both of the ureteral stents is also seen in the bladder.  The previously seen malpositioning of the Foley catheter is no longer demonstrated.  The prostate gland is enlarged.  IMPRESSION:  1.  Moderate bilateral hydronephrosis with bilateral ureteral stents in place. 2.  Moderately enlarged prostate gland.   Original Report Authenticated By: Beckie Salts, M.D.     Assessment/Plan (B)Hydro, obstructive uropathy, ARF For (B)PCN, discussed procedure and risks. Labs reviewed. Consent signed in chart  Brayton El PA-C 01/10/2012, 1:34 PM

## 2012-01-11 DIAGNOSIS — E876 Hypokalemia: Secondary | ICD-10-CM | POA: Diagnosis not present

## 2012-01-11 DIAGNOSIS — N136 Pyonephrosis: Secondary | ICD-10-CM | POA: Diagnosis present

## 2012-01-11 LAB — BASIC METABOLIC PANEL
CO2: 22 mEq/L (ref 19–32)
Chloride: 102 mEq/L (ref 96–112)
Creatinine, Ser: 4.97 mg/dL — ABNORMAL HIGH (ref 0.50–1.35)
Glucose, Bld: 114 mg/dL — ABNORMAL HIGH (ref 70–99)

## 2012-01-11 LAB — PHOSPHORUS: Phosphorus: 3.6 mg/dL (ref 2.3–4.6)

## 2012-01-11 MED ORDER — POTASSIUM CHLORIDE CRYS ER 20 MEQ PO TBCR
20.0000 meq | EXTENDED_RELEASE_TABLET | Freq: Two times a day (BID) | ORAL | Status: DC
  Administered 2012-01-11 – 2012-01-12 (×3): 20 meq via ORAL
  Filled 2012-01-11 (×6): qty 1

## 2012-01-11 MED ORDER — SODIUM CHLORIDE 0.9 % IV SOLN
INTRAVENOUS | Status: DC
  Administered 2012-01-11 – 2012-01-12 (×4): via INTRAVENOUS

## 2012-01-11 NOTE — Progress Notes (Signed)
Subjective: I feel much better  Objective Vital signs in last 24 hours: Filed Vitals:   01/10/12 2349 01/11/12 0145 01/11/12 0521 01/11/12 1435  BP:  124/59 126/66 101/58  Pulse:  122 122 105  Temp: 99.4 F (37.4 C) 99.1 F (37.3 C) 99.6 F (37.6 C) 98.3 F (36.8 C)  TempSrc: Oral Oral Oral Oral  Resp:  16 18 16   Height:      Weight:      SpO2:  100% 98% 100%   Weight change:   Intake/Output Summary (Last 24 hours) at 01/11/12 1815 Last data filed at 01/11/12 1740  Gross per 24 hour  Intake   2580 ml  Output   4700 ml  Net  -2120 ml   Labs: Basic Metabolic Panel:  Lab 01/11/12 1610 01/10/12 0643 01/09/12 1611  NA 137 137 135  K 3.4* 4.2 5.8*  CL 102 105 107  CO2 22 17* 11*  GLUCOSE 114* 102* 94  BUN 86* 109* 113*  CREATININE 4.97* 6.28* 6.32*  ALB -- -- --  CALCIUM 8.2* 9.3 10.1  PHOS 3.6 -- 6.0*   Liver Function Tests:  Lab 01/09/12 1611  AST 19  ALT 31  ALKPHOS 116  BILITOT 0.2*  PROT 7.8  ALBUMIN 3.2*   No results found for this basename: LIPASE:3,AMYLASE:3 in the last 168 hours No results found for this basename: AMMONIA:3 in the last 168 hours CBC:  Lab 01/09/12 1611  WBC 8.9  NEUTROABS 7.5  HGB 9.8*  HCT 29.7*  MCV 89.7  PLT 472*   PT/INR: @labrcntip (inr:5) Cardiac Enzymes: No results found for this basename: CKTOTAL:5,CKMB:5,CKMBINDEX:5,TROPONINI:5 in the last 168 hours CBG:  Lab 01/10/12 0521 01/10/12 0158  GLUCAP 100* 101*    Iron Studies: No results found for this basename: IRON:30,TIBC:30,TRANSFERRIN:30,FERRITIN:30 in the last 168 hours  Physical Exam:  Blood pressure 101/58, pulse 105, temperature 98.3 F (36.8 C), temperature source Oral, resp. rate 16, height 5\' 9"  (1.753 m), weight 58.2 kg (128 lb 4.9 oz), SpO2 100.00%.  Gen: thin WM in no distress  Skin: no rash, cyanosis  HEENT: EOMI, sclera anicteric, throat slightly dry  Neck: flat neck veins, no JVD, no bruits or LAN  Chest: clear bilat throughout  CV: regular,  no rub or gallop, no carotid or femoral bruits, pedal pulses diminished at 1+ bilat  Abdomen: soft, many well-healed scars, RLQ ileostomy with bag in place, bilat perc neph tubes in lower flanks draining cloudy blood tinged fluid/urine  Ext: no LE or UE edema, no joint effusion or deformity, no gangrene or ulceration  Neuro: alert, Ox3, no focal deficit, no asterixis, fully appropriate  UA- >300 prot, +LE/nitrite/Hgb, TNTC wbc/rbc, many bacteria Renal US > moderate bilat hydronephrosis with bilat ureteral stents in place, no cortical thinning  Date   Creat 2011   0.9-1.0 2012   0.9-1.25 Feb 2011  1.04 May-Nov 2013  1.66-5.1 Dec 22, 2011  2.56 01/09/2012  6.32   Impression/Plan  1. Acute renal failure w obstruction, in complicated pt with hx of rectosigmoid cancer s/p LAR x 2 (2006/2011) complicated by bilateral hydronephrosis requiring indwelling JJ ureteral stents. Cause of AKI likely stent obstruction due to infection, stent displacement and/or volume depletion. +UTI. Creat 6.3 on admission 12/6, UOP better, creat down to 4.9 today . Bicarb gtt changed to NS. Expect continued improvement, will sign off. Please call as needed. Pt has nephrologist at [facility], f/u after d/c as needed.  2. Hx rectosigmoid Ca 2006 with recurrence 2011-  see above 3. R ileostomy 4. CKD- baseline creat 1.6-2.5   Vinson Moselle  MD [facility] [phone] pgr    [phone] cell 01/11/2012, 6:15 PM

## 2012-01-11 NOTE — Progress Notes (Signed)
[facility] PROGRESS NOTE  Eric Munoz HQI:[id number] DOB: 04/23/1940 DOA: 01/09/2012 PCP: No primary provider on file.  Brief narrative: Eric Munoz is an 71 y.o. male with a PMH rectosigmoid cancer status post multiple surgeries. He was originally diagnosed 03/2004 and had a transanal resection of rectal polyp and a sigmoid colectomy at that time. He had a recurrent mass noted 06/2009 when he presented with abdominal pain and left hydronephrosis. A double-J stent was placed. He underwent a repeat colonoscopy with biopsies which confirmed recurrent malignancy versus a new primary. He subsequently underwent exploratory laparotomy, lysis of adhesions, redo low anterior resection, En-bloc resection with psoas muscle, left external iliac artery, ileum with end-descending colostomy; Left iliac internal and external iliac lymphadenectomy including paraaortic lymphadenectomy; Left ureterolysis and ureter transposition; Left en-bloc resection of left external iliac artery with left hypogastric artery/internal iliac transposition reconstruction and splenic flexure mobilization. He underwent an exploratory laparotomy, evacuation of hematoma, completion proctectomy and left ureter to right psoas hitching the next day. He has had to have recurrent stents placed by the urologist for recurrent hydronephrosis. 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 He apparently had labs drawn by his nephrologist yesterday, and was advised to come to the hospital to evaluate abnormalities found on his lab work. Lab work here showed acute renal failure with a creatinine of 6.13 and hyperkalemia with potassium of 5.8. He also had metabolic acidosis with a bicarbonate of 13. The patient himself was relatively asymptomatic except for some mild generalized weakness. The patient has had good urine output through his indwelling Foley catheter. He is status post percutaneous nephrostomy tubes  01/10/2012.  Assessment/Plan: Principal Problem:  *Acute renal failure in the setting of indwelling ureteral stents and bilateral hydronephrosis   Admitted and requested interventional radiology place percutaneous nephrostomy tubes. Spoke with urologist who feels that these should be done percutaneously given the patient's extensive surgical history and history of rectovesicular fistula. Percutaneous nephrostomy tubes placed by interventional radiology on 01/10/2012.  Continue to hydrate. Change IV fluids to normal saline given correction of serum bicarbonate levels. Creatinine improving. Active Problems: Pyonephrosis  Pus noted in ureters when nephrostomy tubes placed. Samples sent for culture. Placed on empiric Cipro while awaiting culture results. Stage IV colorectal cancer   Dr. Arline Asp is aware of the patient's admission. Colourethral fistula   The patient has an indwelling Foley catheter. He has surgery scheduled at [facility]. Hyperkalemia /hypokalemia  Given 30 g of Kayexalate in the emergency department. Followup potassium within normal limits. No EKG changes.  Potassium now low. We'll gently supplement. Metabolic acidosis   Corrected. Discontinue IV fluids containing sodium bicarbonate. Normocytic anemia   Mild, likely anemia of chronic disease.  Code Status: Full.  Family Communication: Wife at bedside.  Disposition Plan: Home when stable.  Medical Consultants:    Other Consultants:  Dr. Barron Alvine, urology.  Anti-infectives:  Cipro 01/10/2012--->  HPI/Subjective: Eric Munoz feels well. He would like to go home. Understands that we need his culture results back and his creatinine to be better before he can be discharged. No significant pain, no nausea, no vomiting, feels well.  Objective: Filed Vitals:   01/10/12 1500 01/10/12 2349 01/11/12 0145 01/11/12 0521  BP: 113/67  124/59 126/66  Pulse: 86  122 122  Temp: 97.5 F (36.4 C) 99.4 F (37.4 C) 99.1 F  (37.3 C) 99.6 F (37.6 C)  TempSrc: Oral Oral Oral Oral  Resp: 16  16 18   Height:  Weight:      SpO2: 100%  100% 98%    Intake/Output Summary (Last 24 hours) at 01/11/12 0813 Last data filed at 01/11/12 0700  Gross per 24 hour  Intake   2220 ml  Output   3825 ml  Net  -1605 ml    Exam: Gen:  NAD Cardiovascular:  RRR, No M/R/G Respiratory:  Lungs CTAB Gastrointestinal:  Abdomen soft, NT/ND, + BS Extremities:  No C/E/C  Data Reviewed: Basic Metabolic Panel:  Lab 01/11/12 1610 01/10/12 0643 01/09/12 1611  NA 137 137 135  K 3.4* 4.2 --  CL 102 105 107  CO2 22 17* 11*  GLUCOSE 114* 102* 94  BUN 86* 109* 113*  CREATININE 4.97* 6.28* 6.32*  CALCIUM 8.2* 9.3 10.1  MG -- -- 2.4  PHOS 3.6 -- 6.0*   GFR Estimated Creatinine Clearance: 11.2 ml/min (by C-G formula based on Cr of 4.97). Liver Function Tests:  Lab 01/09/12 1611  AST 19  ALT 31  ALKPHOS 116  BILITOT 0.2*  PROT 7.8  ALBUMIN 3.2*   Coagulation profile  Lab 01/10/12 0643  INR 1.18  PROTIME --    CBC:  Lab 01/09/12 1611  WBC 8.9  NEUTROABS 7.5  HGB 9.8*  HCT 29.7*  MCV 89.7  PLT 472*   CBG:  Lab 01/10/12 0521 01/10/12 0158  GLUCAP 100* 101*   Microbiology Recent Results (from the past 240 hour(s))  MRSA PCR SCREENING     Status: Normal   Collection Time   01/10/12  5:43 AM      Component Value Range Status Comment   MRSA by PCR NEGATIVE  NEGATIVE Final   CULTURE, ROUTINE-ABSCESS     Status: Normal (Preliminary result)   Collection Time   01/10/12  2:49 PM      Component Value Range Status Comment   Specimen Description ABSCESS URINE LEFT   Final    Special Requests NONE   Final    Gram Stain PENDING   Incomplete    Culture NO GROWTH 1 DAY   Final    Report Status PENDING   Incomplete   CULTURE, ROUTINE-ABSCESS     Status: Normal (Preliminary result)   Collection Time   01/10/12  2:50 PM      Component Value Range Status Comment   Specimen Description ABSCESS URINE RIGHT    Final    Special Requests NONE   Final    Gram Stain PENDING   Incomplete    Culture NO GROWTH 1 DAY   Final    Report Status PENDING   Incomplete      Procedures and Diagnostic Studies: US Renal  01/09/2012  *RADIOLOGY REPORT*  Clinical Data: Elevated BUN, creatinine and potassium.  RENAL/URINARY TRACT ULTRASOUND COMPLETE  Comparison:  CT dated 12/21/2011.  CT dated 02/24/2011.  Renal ultrasound performed at [facility] on 11/21/2011.  Findings:  Right Kidney:  Dilated renal collecting system with the proximal portion of a ureteral stent visualized in the inferior pelvis and proximal ureter. The collecting system dilatation has progressed since the previous ultrasound.  Left Kidney:  Dilated renal collecting system with dilatation of the proximal ureter with the proximal portion of a ureteral stent seen within the proximal ureter.  The collecting system dilatation has progressed since the previous ultrasound.  Bladder:  Minimal urine in the bladder with a Foley catheter in place.  The distal portion of one or both of the ureteral stents is also seen in the bladder.  The previously seen malpositioning of the Foley catheter is no longer demonstrated.  The prostate gland is enlarged.  IMPRESSION:  1.  Moderate bilateral hydronephrosis with bilateral ureteral stents in place. 2.  Moderately enlarged prostate gland.   Original Report Authenticated By: Beckie Salts, M.D.     Scheduled Meds:    . [COMPLETED] ciprofloxacin      . [COMPLETED] ciprofloxacin  400 mg Intravenous Once  . feeding supplement  237 mL Oral QHS  . lactose free nutrition  237 mL Oral QHS  . [COMPLETED] metoprolol  5 mg Intravenous NOW  . multivitamin with minerals  1 tablet Oral Daily  . [COMPLETED] sodium chloride  2,000 mL Intravenous Once   Continuous Infusions:    .  sodium bicarbonate infusion 1000 mL 125 mL/hr at 01/11/12 0449    Time spent: 25 minutes.   LOS: 2 days   Jaysha Lasure  [facility] Pager [phone].  If 8PM-8AM, please contact night-coverage at [email or web address], password [facility] 01/11/2012, 8:13 AM

## 2012-01-11 NOTE — Progress Notes (Signed)
Subjective: Pt ok. Minimal soreness at PCN sites. Overall feeling better  Objective: Physical Exam: BP 126/66  Pulse 122  Temp 99.6 F (37.6 C) (Oral)  Resp 18  Ht 5\' 9"  (1.753 m)  Wt 128 lb 4.9 oz (58.2 kg)  BMI 18.95 kg/m2  SpO2 98% (L)PCN intact, site clean, NT  Clear uop (R)PCN intact, site clean, NT, clear UOP   Labs: CBC  Basename 01/09/12 1611  WBC 8.9  HGB 9.8*  HCT 29.7*  PLT 472*   BMET  Basename 01/11/12 0544 01/10/12 0643  NA 137 137  K 3.4* 4.2  CL 102 105  CO2 22 17*  GLUCOSE 114* 102*  BUN 86* 109*  CREATININE 4.97* 6.28*  CALCIUM 8.2* 9.3   LFT  Basename 01/09/12 1611  PROT 7.8  ALBUMIN 3.2*  AST 19  ALT 31  ALKPHOS 116  BILITOT 0.2*  BILIDIR 0.1  IBILI 0.1*  LIPASE --   PT/INR  Basename 01/10/12 0643  LABPROT 14.8  INR 1.18     Studies/Results: US Renal  01/09/2012  *RADIOLOGY REPORT*  Clinical Data: Elevated BUN, creatinine and potassium.  RENAL/URINARY TRACT ULTRASOUND COMPLETE  Comparison:  CT dated 12/21/2011.  CT dated 02/24/2011.  Renal ultrasound performed at [facility] on 11/21/2011.  Findings:  Right Kidney:  Dilated renal collecting system with the proximal portion of a ureteral stent visualized in the inferior pelvis and proximal ureter. The collecting system dilatation has progressed since the previous ultrasound.  Left Kidney:  Dilated renal collecting system with dilatation of the proximal ureter with the proximal portion of a ureteral stent seen within the proximal ureter.  The collecting system dilatation has progressed since the previous ultrasound.  Bladder:  Minimal urine in the bladder with a Foley catheter in place.  The distal portion of one or both of the ureteral stents is also seen in the bladder.  The previously seen malpositioning of the Foley catheter is no longer demonstrated.  The prostate gland is enlarged.  IMPRESSION:  1.  Moderate bilateral hydronephrosis with bilateral ureteral stents in  place. 2.  Moderately enlarged prostate gland.   Original Report Authenticated By: Beckie Salts, M.D.    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 The patient has bilateral hydronephrosis and elevated creatinine despite having bilateral ureteral stents.  PROCEDURE(S): BILATERAL PERCUTANEOUS NEPHROSTOMY TUBE PLACEMENTS WITH ULTRASOUND AND FLUOROSCOPIC GUIDANCE  Physician: Rachelle Hora. Lowella Dandy, MD  Medications:Ciprofloxacin 400 mg IV. Versed 3 mg, Fentanyl 150 mcg. A radiology nurse monitored the patient for moderate sedation. Ciprofloxacin was given within two hours of incision.  Moderate sedation time:25 minutes  Fluoroscopy time: 1.00 minutes  Procedure:The procedure was explained to the patient.  The risks and benefits of the procedure were discussed and the patient's questions were addressed.  Informed consent was obtained from the patient.  The patient was placed prone on the interventional table. Ultrasound demonstrated bilateral hydronephrosis.  Both flanks were prepped and draped in a sterile fashion.  Maximal barrier sterile technique was utilized including caps, mask, sterile gowns, sterile gloves, sterile drape, hand hygiene and skin antiseptic.  The left flank scan was anesthetized with lidocaine.  A 21 gauge needle was directed into a dilated calix with ultrasound guidance.  A 0.018 wire was advanced into the renal pelvis.  A dilator set was placed. The tract was dilated and a 10-French multipurpose drain was placed within the  renal pelvis.  Yellow cloudy fluid was aspirated and sent for Gram stain and culture.  The catheter was sutured to the skin and attached to a gravity bag.  Attention was directed to the right kidney.  A new 21 gauge needle was directed into a dilated calix with ultrasound guidance.  A dilator set was placed.  Tract was dilated and a 10-French  multipurpose drain was reconstituted in the renal pelvis.  20 ml of blood tinged purulent fluid was aspirated. Catheter sutured to the skin and attached to a gravity bag.  Fluoroscopic and ultrasound images were taken and saved for documentation.  Findings:Bilateral moderate hydronephrosis.  Bilateral nephrostomy tubes were placed.  Cloudy yellow fluid was removed from both kidneys.  Findings are concerning for pyonephrosis.  Complications: None  Impression:Successful bilateral percutaneous nephrostomy tube placements with ultrasound and fluoroscopic guidance.   Original Report Authenticated By: Richarda Overlie, M.D.    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 The patient has bilateral hydronephrosis and elevated creatinine despite having bilateral ureteral stents.  PROCEDURE(S): BILATERAL PERCUTANEOUS NEPHROSTOMY TUBE PLACEMENTS WITH ULTRASOUND AND FLUOROSCOPIC GUIDANCE  Physician: Rachelle Hora. Lowella Dandy, MD  Medications:Ciprofloxacin 400 mg IV. Versed 3 mg, Fentanyl 150 mcg. A radiology nurse monitored the patient for moderate sedation. Ciprofloxacin was given within two hours of incision.  Moderate sedation time:25 minutes  Fluoroscopy time: 1.00 minutes  Procedure:The procedure was explained to the patient.  The risks and benefits of the procedure were discussed and the patient's questions were addressed.  Informed consent was obtained from the patient.  The patient was placed prone on the interventional table. Ultrasound demonstrated bilateral hydronephrosis.  Both flanks were prepped and draped in a sterile fashion.  Maximal barrier sterile technique was utilized including caps, mask, sterile gowns, sterile gloves, sterile drape, hand hygiene and skin antiseptic.  The left flank scan was anesthetized with lidocaine.  A 21 gauge needle was directed into a dilated calix with  ultrasound guidance.  A 0.018 wire was advanced into the renal pelvis.  A dilator set was placed. The tract was dilated and a 10-French multipurpose drain was placed within the renal pelvis.  Yellow cloudy fluid was aspirated and sent for Gram stain and culture.  The catheter was sutured to the skin and attached to a gravity bag.  Attention was directed to the right kidney.  A new 21 gauge needle was directed into a dilated calix with ultrasound guidance.  A dilator set was placed.  Tract was dilated and a 10-French multipurpose drain was reconstituted in the renal pelvis.  20 ml of blood tinged purulent fluid was aspirated. Catheter sutured to the skin and attached to a gravity bag.  Fluoroscopic and ultrasound images were taken and saved for documentation.  Findings:Bilateral moderate hydronephrosis.  Bilateral nephrostomy tubes were placed.  Cloudy yellow fluid was removed from both kidneys.  Findings are concerning for pyonephrosis.  Complications: None  Impression:Successful bilateral percutaneous nephrostomy tube placements with ultrasound and fluoroscopic guidance.   Original Report Authenticated By: Richarda Overlie, M.D.    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 The patient has bilateral hydronephrosis and elevated creatinine despite having bilateral ureteral stents.  PROCEDURE(S): BILATERAL PERCUTANEOUS NEPHROSTOMY TUBE PLACEMENTS WITH ULTRASOUND AND FLUOROSCOPIC GUIDANCE  Physician: Rachelle Hora. Lowella Dandy, MD  Medications:Ciprofloxacin 400 mg IV.  Versed 3 mg, Fentanyl 150 mcg. A radiology nurse monitored the patient for moderate sedation. Ciprofloxacin was given within two hours of incision.  Moderate sedation time:25 minutes  Fluoroscopy time: 1.00 minutes  Procedure:The procedure was explained to the patient.  The risks and benefits of the procedure were discussed and the  patient's questions were addressed.  Informed consent was obtained from the patient.  The patient was placed prone on the interventional table. Ultrasound demonstrated bilateral hydronephrosis.  Both flanks were prepped and draped in a sterile fashion.  Maximal barrier sterile technique was utilized including caps, mask, sterile gowns, sterile gloves, sterile drape, hand hygiene and skin antiseptic.  The left flank scan was anesthetized with lidocaine.  A 21 gauge needle was directed into a dilated calix with ultrasound guidance.  A 0.018 wire was advanced into the renal pelvis.  A dilator set was placed. The tract was dilated and a 10-French multipurpose drain was placed within the renal pelvis.  Yellow cloudy fluid was aspirated and sent for Gram stain and culture.  The catheter was sutured to the skin and attached to a gravity bag.  Attention was directed to the right kidney.  A new 21 gauge needle was directed into a dilated calix with ultrasound guidance.  A dilator set was placed.  Tract was dilated and a 10-French multipurpose drain was reconstituted in the renal pelvis.  20 ml of blood tinged purulent fluid was aspirated. Catheter sutured to the skin and attached to a gravity bag.  Fluoroscopic and ultrasound images were taken and saved for documentation.  Findings:Bilateral moderate hydronephrosis.  Bilateral nephrostomy tubes were placed.  Cloudy yellow fluid was removed from both kidneys.  Findings are concerning for pyonephrosis.  Complications: None  Impression:Successful bilateral percutaneous nephrostomy tube placements with ultrasound and fluoroscopic guidance.   Original Report Authenticated By: Richarda Overlie, M.D.    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 The patient has bilateral hydronephrosis and elevated creatinine despite having bilateral ureteral  stents.  PROCEDURE(S): BILATERAL PERCUTANEOUS NEPHROSTOMY TUBE PLACEMENTS WITH ULTRASOUND AND FLUOROSCOPIC GUIDANCE  Physician: Rachelle Hora. Lowella Dandy, MD  Medications:Ciprofloxacin 400 mg IV. Versed 3 mg, Fentanyl 150 mcg. A radiology nurse monitored the patient for moderate sedation. Ciprofloxacin was given within two hours of incision.  Moderate sedation time:25 minutes  Fluoroscopy time: 1.00 minutes  Procedure:The procedure was explained to the patient.  The risks and benefits of the procedure were discussed and the patient's questions were addressed.  Informed consent was obtained from the patient.  The patient was placed prone on the interventional table. Ultrasound demonstrated bilateral hydronephrosis.  Both flanks were prepped and draped in a sterile fashion.  Maximal barrier sterile technique was utilized including caps, mask, sterile gowns, sterile gloves, sterile drape, hand hygiene and skin antiseptic.  The left flank scan was anesthetized with lidocaine.  A 21 gauge needle was directed into a dilated calix with ultrasound guidance.  A 0.018 wire was advanced into the renal pelvis.  A dilator set was placed. The tract was dilated and a 10-French multipurpose drain was placed within the renal pelvis.  Yellow cloudy fluid was aspirated and sent for Gram stain and culture.  The catheter was sutured to the skin and attached to a gravity bag.  Attention was directed to the right kidney.  A new 21 gauge needle was directed into a dilated calix with ultrasound guidance.  A  dilator set was placed.  Tract was dilated and a 10-French multipurpose drain was reconstituted in the renal pelvis.  20 ml of blood tinged purulent fluid was aspirated. Catheter sutured to the skin and attached to a gravity bag.  Fluoroscopic and ultrasound images were taken and saved for documentation.  Findings:Bilateral moderate hydronephrosis.  Bilateral nephrostomy tubes were placed.  Cloudy yellow fluid was removed from both kidneys.  Findings  are concerning for pyonephrosis.  Complications: None  Impression:Successful bilateral percutaneous nephrostomy tube placements with ultrasound and fluoroscopic guidance.   Original Report Authenticated By: Richarda Overlie, M.D.     Assessment/Plan: Obstructive uropathy with recurrent bilateral hydronephrosis. S/p Bilateral PCN, good function Cr down to 4.9 from 6.3 Cont to follow  LOS: 2 days    Brayton El PA-C 01/11/2012 11:27 AM

## 2012-01-11 NOTE — Progress Notes (Signed)
Patient ID: Eric Munoz, male   DOB: [birth year], 71 y.o.   MRN: [id number]   Subjective: Patient reports no new complaints or concerns. Nephrostomy tube placement occurred without incident. Both nephrostomy tubes are draining well and the urine is clear. Creatinine has improved.  Objective: Vital signs in last 24 hours: Temp:  [97.5 F (36.4 C)-99.6 F (37.6 C)] 99.6 F (37.6 C) (12/08 0521) Pulse Rate:  [86-122] 122  (12/08 0521) Resp:  [7-21] 18  (12/08 0521) BP: (113-147)/(59-82) 126/66 mmHg (12/08 0521) SpO2:  [98 %-100 %] 98 % (12/08 0521)  Intake/Output from previous day: 12/07 0701 - 12/08 0700 In: 2220 [P.O.:720; I.V.:1500] Out: 3100 [Urine:2100; Stool:1000] Intake/Output this shift:    Physical Exam:  Constitutional: Vital signs reviewed. WD WN in NAD   Cardiovascular: RRR Pulmonary/Chest: Normal effort Abdominal: Soft. Non-tender, non-distended, bowel sounds are normal, no masses, organomegaly, or guarding present.  Genitourinary: Indwelling Foley catheter with gross blood.  Extremities: No cyanosis or edema   Lab Results:  [facility] 01/09/12 1611  HGB 9.8*  HCT 29.7*   BMET  Basename 01/11/12 0544 01/10/12 0643  NA 137 137  K 3.4* 4.2  CL 102 105  CO2 22 17*  GLUCOSE 114* 102*  BUN 86* 109*  CREATININE 4.97* 6.28*  CALCIUM 8.2* 9.3    Basename 01/10/12 0643  LABPT --  INR 1.18   No results found for this basename: LABURIN:1 in the last 72 hours Results for orders placed during the hospital encounter of 01/09/12  MRSA PCR SCREENING     Status: Normal   Collection Time   01/10/12  5:43 AM      Component Value Range Status Comment   MRSA by PCR NEGATIVE  NEGATIVE Final   CULTURE, ROUTINE-ABSCESS     Status: Normal (Preliminary result)   Collection Time   01/10/12  2:49 PM      Component Value Range Status Comment   Specimen Description ABSCESS URINE LEFT   Final    Special Requests NONE   Final    Gram Stain PENDING   Incomplete    Culture NO  GROWTH 1 DAY   Final    Report Status PENDING   Incomplete   CULTURE, ROUTINE-ABSCESS     Status: Normal (Preliminary result)   Collection Time   01/10/12  2:50 PM      Component Value Range Status Comment   Specimen Description ABSCESS URINE RIGHT   Final    Special Requests NONE   Final    Gram Stain PENDING   Incomplete    Culture NO GROWTH 1 DAY   Final    Report Status PENDING   Incomplete     Studies/Results: US Renal  01/09/2012  *RADIOLOGY REPORT*  Clinical Data: Elevated BUN, creatinine and potassium.  RENAL/URINARY TRACT ULTRASOUND COMPLETE  Comparison:  CT dated 12/21/2011.  CT dated 02/24/2011.  Renal ultrasound performed at [facility] on 11/21/2011.  Findings:  Right Kidney:  Dilated renal collecting system with the proximal portion of a ureteral stent visualized in the inferior pelvis and proximal ureter. The collecting system dilatation has progressed since the previous ultrasound.  Left Kidney:  Dilated renal collecting system with dilatation of the proximal ureter with the proximal portion of a ureteral stent seen within the proximal ureter.  The collecting system dilatation has progressed since the previous ultrasound.  Bladder:  Minimal urine in the bladder with a Foley catheter in place.  The distal portion of one or  both of the ureteral stents is also seen in the bladder.  The previously seen malpositioning of the Foley catheter is no longer demonstrated.  The prostate gland is enlarged.  IMPRESSION:  1.  Moderate bilateral hydronephrosis with bilateral ureteral stents in place. 2.  Moderately enlarged prostate gland.   Original Report Authenticated By: Beckie Salts, M.D.    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 The patient has bilateral hydronephrosis and elevated creatinine despite having bilateral ureteral stents.   PROCEDURE(S): BILATERAL PERCUTANEOUS NEPHROSTOMY TUBE PLACEMENTS WITH ULTRASOUND AND FLUOROSCOPIC GUIDANCE  Physician: Rachelle Hora. Lowella Dandy, MD  Medications:Ciprofloxacin 400 mg IV. Versed 3 mg, Fentanyl 150 mcg. A radiology nurse monitored the patient for moderate sedation. Ciprofloxacin was given within two hours of incision.  Moderate sedation time:25 minutes  Fluoroscopy time: 1.00 minutes  Procedure:The procedure was explained to the patient.  The risks and benefits of the procedure were discussed and the patient's questions were addressed.  Informed consent was obtained from the patient.  The patient was placed prone on the interventional table. Ultrasound demonstrated bilateral hydronephrosis.  Both flanks were prepped and draped in a sterile fashion.  Maximal barrier sterile technique was utilized including caps, mask, sterile gowns, sterile gloves, sterile drape, hand hygiene and skin antiseptic.  The left flank scan was anesthetized with lidocaine.  A 21 gauge needle was directed into a dilated calix with ultrasound guidance.  A 0.018 wire was advanced into the renal pelvis.  A dilator set was placed. The tract was dilated and a 10-French multipurpose drain was placed within the renal pelvis.  Yellow cloudy fluid was aspirated and sent for Gram stain and culture.  The catheter was sutured to the skin and attached to a gravity bag.  Attention was directed to the right kidney.  A new 21 gauge needle was directed into a dilated calix with ultrasound guidance.  A dilator set was placed.  Tract was dilated and a 10-French multipurpose drain was reconstituted in the renal pelvis.  20 ml of blood tinged purulent fluid was aspirated. Catheter sutured to the skin and attached to a gravity bag.  Fluoroscopic and ultrasound images were taken and saved for documentation.  Findings:Bilateral moderate hydronephrosis.  Bilateral nephrostomy tubes were placed.  Cloudy yellow fluid was removed from both kidneys.  Findings are  concerning for pyonephrosis.  Complications: None  Impression:Successful bilateral percutaneous nephrostomy tube placements with ultrasound and fluoroscopic guidance.   Original Report Authenticated By: Richarda Overlie, M.D.    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 The patient has bilateral hydronephrosis and elevated creatinine despite having bilateral ureteral stents.  PROCEDURE(S): BILATERAL PERCUTANEOUS NEPHROSTOMY TUBE PLACEMENTS WITH ULTRASOUND AND FLUOROSCOPIC GUIDANCE  Physician: Rachelle Hora. Lowella Dandy, MD  Medications:Ciprofloxacin 400 mg IV. Versed 3 mg, Fentanyl 150 mcg. A radiology nurse monitored the patient for moderate sedation. Ciprofloxacin was given within two hours of incision.  Moderate sedation time:25 minutes  Fluoroscopy time: 1.00 minutes  Procedure:The procedure was explained to the patient.  The risks and benefits of the procedure were discussed and the patient's questions were addressed.  Informed consent was obtained from the patient.  The patient was placed prone on the interventional table. Ultrasound demonstrated bilateral hydronephrosis.  Both flanks were prepped and draped in a sterile fashion.  Maximal barrier sterile technique was  utilized including caps, mask, sterile gowns, sterile gloves, sterile drape, hand hygiene and skin antiseptic.  The left flank scan was anesthetized with lidocaine.  A 21 gauge needle was directed into a dilated calix with ultrasound guidance.  A 0.018 wire was advanced into the renal pelvis.  A dilator set was placed. The tract was dilated and a 10-French multipurpose drain was placed within the renal pelvis.  Yellow cloudy fluid was aspirated and sent for Gram stain and culture.  The catheter was sutured to the skin and attached to a gravity bag.  Attention was directed to the right kidney.  A new 21 gauge needle was  directed into a dilated calix with ultrasound guidance.  A dilator set was placed.  Tract was dilated and a 10-French multipurpose drain was reconstituted in the renal pelvis.  20 ml of blood tinged purulent fluid was aspirated. Catheter sutured to the skin and attached to a gravity bag.  Fluoroscopic and ultrasound images were taken and saved for documentation.  Findings:Bilateral moderate hydronephrosis.  Bilateral nephrostomy tubes were placed.  Cloudy yellow fluid was removed from both kidneys.  Findings are concerning for pyonephrosis.  Complications: None  Impression:Successful bilateral percutaneous nephrostomy tube placements with ultrasound and fluoroscopic guidance.   Original Report Authenticated By: Richarda Overlie, M.D.    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 The patient has bilateral hydronephrosis and elevated creatinine despite having bilateral ureteral stents.  PROCEDURE(S): BILATERAL PERCUTANEOUS NEPHROSTOMY TUBE PLACEMENTS WITH ULTRASOUND AND FLUOROSCOPIC GUIDANCE  Physician: Rachelle Hora. Lowella Dandy, MD  Medications:Ciprofloxacin 400 mg IV. Versed 3 mg, Fentanyl 150 mcg. A radiology nurse monitored the patient for moderate sedation. Ciprofloxacin was given within two hours of incision.  Moderate sedation time:25 minutes  Fluoroscopy time: 1.00 minutes  Procedure:The procedure was explained to the patient.  The risks and benefits of the procedure were discussed and the patient's questions were addressed.  Informed consent was obtained from the patient.  The patient was placed prone on the interventional table. Ultrasound demonstrated bilateral hydronephrosis.  Both flanks were prepped and draped in a sterile fashion.  Maximal barrier sterile technique was utilized including caps, mask, sterile gowns, sterile gloves, sterile drape, hand hygiene and skin antiseptic.   The left flank scan was anesthetized with lidocaine.  A 21 gauge needle was directed into a dilated calix with ultrasound guidance.  A 0.018 wire was advanced into the renal pelvis.  A dilator set was placed. The tract was dilated and a 10-French multipurpose drain was placed within the renal pelvis.  Yellow cloudy fluid was aspirated and sent for Gram stain and culture.  The catheter was sutured to the skin and attached to a gravity bag.  Attention was directed to the right kidney.  A new 21 gauge needle was directed into a dilated calix with ultrasound guidance.  A dilator set was placed.  Tract was dilated and a 10-French multipurpose drain was reconstituted in the renal pelvis.  20 ml of blood tinged purulent fluid was aspirated. Catheter sutured to the skin and attached to a gravity bag.  Fluoroscopic and ultrasound images were taken and saved for documentation.  Findings:Bilateral moderate hydronephrosis.  Bilateral nephrostomy tubes were placed.  Cloudy yellow fluid was removed from both kidneys.  Findings are concerning for pyonephrosis.  Complications: None  Impression:Successful bilateral percutaneous nephrostomy tube placements with ultrasound and fluoroscopic guidance.   Original Report Authenticated  By: Richarda Overlie, M.D.    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 The patient has bilateral hydronephrosis and elevated creatinine despite having bilateral ureteral stents.  PROCEDURE(S): BILATERAL PERCUTANEOUS NEPHROSTOMY TUBE PLACEMENTS WITH ULTRASOUND AND FLUOROSCOPIC GUIDANCE  Physician: Rachelle Hora. Lowella Dandy, MD  Medications:Ciprofloxacin 400 mg IV. Versed 3 mg, Fentanyl 150 mcg. A radiology nurse monitored the patient for moderate sedation. Ciprofloxacin was given within two hours of incision.  Moderate sedation time:25 minutes  Fluoroscopy time: 1.00 minutes   Procedure:The procedure was explained to the patient.  The risks and benefits of the procedure were discussed and the patient's questions were addressed.  Informed consent was obtained from the patient.  The patient was placed prone on the interventional table. Ultrasound demonstrated bilateral hydronephrosis.  Both flanks were prepped and draped in a sterile fashion.  Maximal barrier sterile technique was utilized including caps, mask, sterile gowns, sterile gloves, sterile drape, hand hygiene and skin antiseptic.  The left flank scan was anesthetized with lidocaine.  A 21 gauge needle was directed into a dilated calix with ultrasound guidance.  A 0.018 wire was advanced into the renal pelvis.  A dilator set was placed. The tract was dilated and a 10-French multipurpose drain was placed within the renal pelvis.  Yellow cloudy fluid was aspirated and sent for Gram stain and culture.  The catheter was sutured to the skin and attached to a gravity bag.  Attention was directed to the right kidney.  A new 21 gauge needle was directed into a dilated calix with ultrasound guidance.  A dilator set was placed.  Tract was dilated and a 10-French multipurpose drain was reconstituted in the renal pelvis.  20 ml of blood tinged purulent fluid was aspirated. Catheter sutured to the skin and attached to a gravity bag.  Fluoroscopic and ultrasound images were taken and saved for documentation.  Findings:Bilateral moderate hydronephrosis.  Bilateral nephrostomy tubes were placed.  Cloudy yellow fluid was removed from both kidneys.  Findings are concerning for pyonephrosis.  Complications: None  Impression:Successful bilateral percutaneous nephrostomy tube placements with ultrasound and fluoroscopic guidance.   Original Report Authenticated By: Richarda Overlie, M.D.     Assessment/Plan:   Obstructive uropathy with recurrent bilateral hydronephrosis. Urine output appears excellent with his nephrostomy tubes and creatinine is  improving. Hopefully he can be discharged tomorrow. We will let Dr. [name] know of his admission. He does again have plans for a major reconstructive surgery at [facility] in the near future.   LOS: 2 days   Glynis Hunsucker S 01/11/2012, 7:48 AM

## 2012-01-11 NOTE — H&P (Signed)
agree

## 2012-01-12 LAB — BASIC METABOLIC PANEL
CO2: 23 mEq/L (ref 19–32)
Chloride: 109 mEq/L (ref 96–112)
Creatinine, Ser: 3.97 mg/dL — ABNORMAL HIGH (ref 0.50–1.35)
Glucose, Bld: 92 mg/dL (ref 70–99)
Sodium: 140 mEq/L (ref 135–145)

## 2012-01-12 MED ORDER — FLUCONAZOLE 200 MG PO TABS: 200.0000 mg | ORAL_TABLET | Freq: Every day | ORAL | Status: DC

## 2012-01-12 NOTE — Progress Notes (Signed)
Pt d/c with foley catheter, and bilateral nephrostomy tubes intact. Taught pt how to empty bilateral nephrostomy tubes and how to change dressing around nephrostomy tubes. pt verbalized understanding.

## 2012-01-12 NOTE — Discharge Summary (Signed)
Physician Discharge Summary  JARQUEZ MESTRE ZOX:[id number] DOB: 19-Feb-1940 DOA: 01/09/2012  PCP: No primary provider on file.  Admit date: 01/09/2012 Discharge date: 01/12/2012  Recommendations for Outpatient Follow-up:  1. The patient has been set up with home health nursing to help care for bilateral nephrostomy tubes and Foley catheter. 2. He is scheduled for followup at [facility] on 01/15/2012. 3. He was instructed to followup with his urologist and with his nephrologist in 1-2 weeks. 4. Followup final urine culture results. Sent out on Diflucan for preliminary culture growing yeast.  Discharge Diagnoses:   Principal Problem:  *Acute renal failure in the setting of indwelling ureteral stents and bilateral hydronephrosis Active Problems:   Colourethral fistula    Hyperkalemia   Metabolic acidosis   Normocytic anemia   Rectosigmoid cancer, stage IV   Pyonephrosis   Hypokalemia   Discharge Condition: Improved.  Diet recommendation: Low-sodium, heart healthy.  History of present illness:  Eric Munoz is an 71 y.o. male with a PMH rectosigmoid cancer status post multiple surgeries. He was originally diagnosed 03/2004 and had a transanal resection of rectal polyp and a sigmoid colectomy at that time. He had a recurrent mass noted 06/2009 when he presented with abdominal pain and left hydronephrosis. A double-J stent was placed. He underwent a repeat colonoscopy with biopsies which confirmed recurrent malignancy versus a new primary. He subsequently underwent exploratory laparotomy, lysis of adhesions, redo low anterior resection, En-bloc resection with psoas muscle, left external iliac artery, ileum with end-descending colostomy; Left iliac internal and external iliac lymphadenectomy including paraaortic lymphadenectomy; Left ureterolysis and ureter transposition; Left en-bloc resection of left external iliac artery with left hypogastric artery/internal iliac transposition reconstruction  and splenic flexure mobilization. He underwent an exploratory laparotomy, evacuation of hematoma, completion proctectomy and left ureter to right psoas hitching the next day. He has had to have recurrent stents placed by the urologist for recurrent hydronephrosis. 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 He apparently had labs drawn by his nephrologist yesterday, and was advised to come to the hospital to evaluate abnormalities found on his lab work. Lab work here showed acute renal failure with a creatinine of 6.13 and hyperkalemia with potassium of 5.8. He also had metabolic acidosis with a bicarbonate of 13. The patient himself was relatively asymptomatic except for some mild generalized weakness. The patient has had good urine output through his indwelling Foley catheter. He is status post percutaneous nephrostomy tubes 01/10/2012.  Hospital Course by problem:  Principal Problem:  *Acute renal failure in the setting of indwelling ureteral stents and bilateral hydronephrosis  Admitted and requested interventional radiology place percutaneous nephrostomy tubes. Spoke with urologist who feels that these should be done percutaneously given the patient's extensive surgical history and history of rectovesicular fistula. Percutaneous nephrostomy tubes placed by interventional radiology on 01/10/2012.  Creatinine and electrolytes steadily improved on IV fluids, initially containing bicarbonate but later changed to normal saline. Active Problems:  Pyonephrosis  Pus noted in ureters when nephrostomy tubes placed. Samples sent for culture. Placed on empiric Cipro while awaiting culture results. Culture is preliminary but growing Candida albicans. The patient will be discharged on a seven-day course of Diflucan. Stage IV colorectal cancer  Dr. Arline Asp is aware of the patient's admission. He has followup scheduled with Dr. Arline Asp on 03/05/2012. Colourethral fistula  The  patient has an indwelling Foley catheter. He has surgery scheduled at [facility] on 01/15/2012 for repair. Hyperkalemia /hypokalemia  Given 30 g of  Kayexalate in the emergency department. Followup potassium within normal limits. No EKG changes.  Potassium subsequently was found to be low. Normalized after supplementation. Metabolic acidosis  Corrected with IV fluids containing sodium bicarbonate. Normocytic anemia  Mild, likely anemia of chronic disease.  Procedures:  Percutaneous nephrostomy tube placement bilaterally done on 01/10/2012.  Consultations:  Dr. Abundio Miu, interventional radiology  Dr. Delano Metz, nephrology  Dr. Barron Alvine, urology  Discharge Exam: Filed Vitals:   01/12/12 0553  BP: 121/70  Pulse: 82  Temp: 98.8 F (37.1 C)  Resp: 16   Filed Vitals:   01/11/12 0521 01/11/12 1435 01/11/12 2242 01/12/12 0553  BP: 126/66 101/58 129/69 121/70  Pulse: 122 105 92 82  Temp: 99.6 F (37.6 C) 98.3 F (36.8 C) 98 F (36.7 C) 98.8 F (37.1 C)  TempSrc: Oral Oral Oral Oral  Resp: 18 16 18 16   Height:      Weight: 58 kg (127 lb 13.9 oz)   59 kg (130 lb 1.1 oz)  SpO2: 98% 100% 99% 100%    Gen:  NAD Cardiovascular:  RRR, No M/R/G Respiratory: Lungs CTAB Gastrointestinal: Abdomen soft, NT/ND with normal active bowel sounds. Extremities: No C/E/C   Discharge Instructions      Discharge Orders    Future Appointments: Provider: Department: Dept Phone: Center:   03/05/2012 3:30 PM Radene Gunning [facility] MEDICAL ONCOLOGY [phone] None   03/05/2012 4:00 PM Samul Dada, MD [facility] MEDICAL ONCOLOGY [phone] None     Future Orders Please Complete By Expires   Diet - low sodium heart healthy      Increase activity slowly      Call MD for:  redness, tenderness, or signs of infection (pain, swelling, redness, odor or green/yellow discharge around incision site)      Call MD for:  temperature >100.4      Call MD  for:  severe uncontrolled pain      Home Health      Scheduling Instructions:   Teach patient how to care for nephrostomy tubes.   Questions: Responses:   To provide the following care/treatments RN       Medication List     As of 01/12/2012  2:55 PM    TAKE these medications         feeding supplement Liqd   Take 1 Can by mouth daily with breakfast. Per patient taking supplement that is in plastic bottle. Was not an option in database.      lactose free nutrition Liqd   Take 237 mLs by mouth at bedtime.      fluconazole 200 MG tablet   Commonly known as: DIFLUCAN   Take 1 tablet (200 mg total) by mouth daily.      multivitamin with minerals Tabs   Take 1 tablet by mouth daily.        Follow-up Information    Follow up with DAHLSTEDT, Bertram Millard, MD. Schedule an appointment as soon as possible for a visit in 1 week.   Contact information:   [address] [place] [address] [phone]       Follow up with COLADONATO,JOSEPH A, MD. Schedule an appointment as soon as possible for a visit in 2 weeks.   Contact information:   [address] [facility] [place] [address] [phone]           The results of significant diagnostics from this hospitalization (  including imaging, microbiology, ancillary and laboratory) are listed below for reference.    Significant Diagnostic Studies: US Renal  01/09/2012  *RADIOLOGY REPORT*  Clinical Data: Elevated BUN, creatinine and potassium.  RENAL/URINARY TRACT ULTRASOUND COMPLETE  Comparison:  CT dated 12/21/2011.  CT dated 02/24/2011.  Renal ultrasound performed at [facility] on 11/21/2011.  Findings:  Right Kidney:  Dilated renal collecting system with the proximal portion of a ureteral stent visualized in the inferior pelvis and proximal ureter. The collecting system dilatation has progressed since the previous ultrasound.  Left Kidney:  Dilated renal collecting system with  dilatation of the proximal ureter with the proximal portion of a ureteral stent seen within the proximal ureter.  The collecting system dilatation has progressed since the previous ultrasound.  Bladder:  Minimal urine in the bladder with a Foley catheter in place.  The distal portion of one or both of the ureteral stents is also seen in the bladder.  The previously seen malpositioning of the Foley catheter is no longer demonstrated.  The prostate gland is enlarged.  IMPRESSION:  1.  Moderate bilateral hydronephrosis with bilateral ureteral stents in place. 2.  Moderately enlarged prostate gland.   Original Report Authenticated By: Beckie Salts, M.D.    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 The patient has bilateral hydronephrosis and elevated creatinine despite having bilateral ureteral stents.  PROCEDURE(S): BILATERAL PERCUTANEOUS NEPHROSTOMY TUBE PLACEMENTS WITH ULTRASOUND AND FLUOROSCOPIC GUIDANCE  Physician: Rachelle Hora. Lowella Dandy, MD  Medications:Ciprofloxacin 400 mg IV. Versed 3 mg, Fentanyl 150 mcg. A radiology nurse monitored the patient for moderate sedation. Ciprofloxacin was given within two hours of incision.  Moderate sedation time:25 minutes  Fluoroscopy time: 1.00 minutes  Procedure:The procedure was explained to the patient.  The risks and benefits of the procedure were discussed and the patient's questions were addressed.  Informed consent was obtained from the patient.  The patient was placed prone on the interventional table. Ultrasound demonstrated bilateral hydronephrosis.  Both flanks were prepped and draped in a sterile fashion.  Maximal barrier sterile technique was utilized including caps, mask, sterile gowns, sterile gloves, sterile drape, hand hygiene and skin antiseptic.  The left flank scan was anesthetized with lidocaine.  A 21 gauge needle was  directed into a dilated calix with ultrasound guidance.  A 0.018 wire was advanced into the renal pelvis.  A dilator set was placed. The tract was dilated and a 10-French multipurpose drain was placed within the renal pelvis.  Yellow cloudy fluid was aspirated and sent for Gram stain and culture.  The catheter was sutured to the skin and attached to a gravity bag.  Attention was directed to the right kidney.  A new 21 gauge needle was directed into a dilated calix with ultrasound guidance.  A dilator set was placed.  Tract was dilated and a 10-French multipurpose drain was reconstituted in the renal pelvis.  20 ml of blood tinged purulent fluid was aspirated. Catheter sutured to the skin and attached to a gravity bag.  Fluoroscopic and ultrasound images were taken and saved for documentation.  Findings:Bilateral moderate hydronephrosis.  Bilateral nephrostomy tubes were placed.  Cloudy yellow fluid was removed from both kidneys.  Findings are concerning for pyonephrosis.  Complications: None  Impression:Successful bilateral percutaneous nephrostomy tube placements with ultrasound and fluoroscopic guidance.   Original Report Authenticated By: Richarda Overlie, M.D.    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 The patient has bilateral hydronephrosis and elevated creatinine despite having bilateral ureteral stents.  PROCEDURE(S): BILATERAL PERCUTANEOUS NEPHROSTOMY TUBE PLACEMENTS WITH ULTRASOUND AND FLUOROSCOPIC GUIDANCE  Physician: Rachelle Hora. Lowella Dandy, MD  Medications:Ciprofloxacin 400 mg IV. Versed 3 mg, Fentanyl 150 mcg. A radiology nurse monitored the patient for moderate sedation. Ciprofloxacin was given within two hours of incision.  Moderate sedation time:25 minutes  Fluoroscopy time: 1.00 minutes  Procedure:The procedure was explained to the patient.  The risks and benefits of  the procedure were discussed and the patient's questions were addressed.  Informed consent was obtained from the patient.  The patient was placed prone on the interventional table. Ultrasound demonstrated bilateral hydronephrosis.  Both flanks were prepped and draped in a sterile fashion.  Maximal barrier sterile technique was utilized including caps, mask, sterile gowns, sterile gloves, sterile drape, hand hygiene and skin antiseptic.  The left flank scan was anesthetized with lidocaine.  A 21 gauge needle was directed into a dilated calix with ultrasound guidance.  A 0.018 wire was advanced into the renal pelvis.  A dilator set was placed. The tract was dilated and a 10-French multipurpose drain was placed within the renal pelvis.  Yellow cloudy fluid was aspirated and sent for Gram stain and culture.  The catheter was sutured to the skin and attached to a gravity bag.  Attention was directed to the right kidney.  A new 21 gauge needle was directed into a dilated calix with ultrasound guidance.  A dilator set was placed.  Tract was dilated and a 10-French multipurpose drain was reconstituted in the renal pelvis.  20 ml of blood tinged purulent fluid was aspirated. Catheter sutured to the skin and attached to a gravity bag.  Fluoroscopic and ultrasound images were taken and saved for documentation.  Findings:Bilateral moderate hydronephrosis.  Bilateral nephrostomy tubes were placed.  Cloudy yellow fluid was removed from both kidneys.  Findings are concerning for pyonephrosis.  Complications: None  Impression:Successful bilateral percutaneous nephrostomy tube placements with ultrasound and fluoroscopic guidance.   Original Report Authenticated By: Richarda Overlie, M.D.    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 The patient has bilateral hydronephrosis and elevated creatinine  despite having bilateral ureteral stents.  PROCEDURE(S): BILATERAL PERCUTANEOUS NEPHROSTOMY TUBE PLACEMENTS WITH ULTRASOUND AND FLUOROSCOPIC GUIDANCE  Physician: Rachelle Hora. Lowella Dandy, MD  Medications:Ciprofloxacin 400 mg IV. Versed 3 mg, Fentanyl 150 mcg. A radiology nurse monitored the patient for moderate sedation. Ciprofloxacin was given within two hours of incision.  Moderate sedation time:25 minutes  Fluoroscopy time: 1.00 minutes  Procedure:The procedure was explained to the patient.  The risks and benefits of the procedure were discussed and the patient's questions were addressed.  Informed consent was obtained from the patient.  The patient was placed prone on the interventional table. Ultrasound demonstrated bilateral hydronephrosis.  Both flanks were prepped and draped in a sterile fashion.  Maximal barrier sterile technique was utilized including caps, mask, sterile gowns, sterile gloves, sterile drape, hand hygiene and skin antiseptic.  The left flank scan was anesthetized with lidocaine.  A 21 gauge needle was directed into a dilated calix with ultrasound guidance.  A 0.018 wire was advanced into the renal pelvis.  A dilator set was placed. The tract was dilated and a 10-French multipurpose drain was placed within  the renal pelvis.  Yellow cloudy fluid was aspirated and sent for Gram stain and culture.  The catheter was sutured to the skin and attached to a gravity bag.  Attention was directed to the right kidney.  A new 21 gauge needle was directed into a dilated calix with ultrasound guidance.  A dilator set was placed.  Tract was dilated and a 10-French multipurpose drain was reconstituted in the renal pelvis.  20 ml of blood tinged purulent fluid was aspirated. Catheter sutured to the skin and attached to a gravity bag.  Fluoroscopic and ultrasound images were taken and saved for documentation.  Findings:Bilateral moderate hydronephrosis.  Bilateral nephrostomy tubes were placed.  Cloudy yellow fluid was  removed from both kidneys.  Findings are concerning for pyonephrosis.  Complications: None  Impression:Successful bilateral percutaneous nephrostomy tube placements with ultrasound and fluoroscopic guidance.   Original Report Authenticated By: Richarda Overlie, M.D.    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 The patient has bilateral hydronephrosis and elevated creatinine despite having bilateral ureteral stents.  PROCEDURE(S): BILATERAL PERCUTANEOUS NEPHROSTOMY TUBE PLACEMENTS WITH ULTRASOUND AND FLUOROSCOPIC GUIDANCE  Physician: Rachelle Hora. Lowella Dandy, MD  Medications:Ciprofloxacin 400 mg IV. Versed 3 mg, Fentanyl 150 mcg. A radiology nurse monitored the patient for moderate sedation. Ciprofloxacin was given within two hours of incision.  Moderate sedation time:25 minutes  Fluoroscopy time: 1.00 minutes  Procedure:The procedure was explained to the patient.  The risks and benefits of the procedure were discussed and the patient's questions were addressed.  Informed consent was obtained from the patient.  The patient was placed prone on the interventional table. Ultrasound demonstrated bilateral hydronephrosis.  Both flanks were prepped and draped in a sterile fashion.  Maximal barrier sterile technique was utilized including caps, mask, sterile gowns, sterile gloves, sterile drape, hand hygiene and skin antiseptic.  The left flank scan was anesthetized with lidocaine.  A 21 gauge needle was directed into a dilated calix with ultrasound guidance.  A 0.018 wire was advanced into the renal pelvis.  A dilator set was placed. The tract was dilated and a 10-French multipurpose drain was placed within the renal pelvis.  Yellow cloudy fluid was aspirated and sent for Gram stain and culture.  The catheter was sutured to the skin and attached to a gravity bag.  Attention was directed to the  right kidney.  A new 21 gauge needle was directed into a dilated calix with ultrasound guidance.  A dilator set was placed.  Tract was dilated and a 10-French multipurpose drain was reconstituted in the renal pelvis.  20 ml of blood tinged purulent fluid was aspirated. Catheter sutured to the skin and attached to a gravity bag.  Fluoroscopic and ultrasound images were taken and saved for documentation.  Findings:Bilateral moderate hydronephrosis.  Bilateral nephrostomy tubes were placed.  Cloudy yellow fluid was removed from both kidneys.  Findings are concerning for pyonephrosis.  Complications: None  Impression:Successful bilateral percutaneous nephrostomy tube placements with ultrasound and fluoroscopic guidance.   Original Report Authenticated By: Richarda Overlie, M.D.     Microbiology: Recent Results (from the past 240 hour(s))  MRSA PCR SCREENING     Status: Normal   Collection Time   01/10/12  5:43 AM      Component Value Range Status Comment   MRSA by PCR NEGATIVE  NEGATIVE Final   CULTURE, ROUTINE-ABSCESS     Status: Normal (Preliminary  result)   Collection Time   01/10/12  2:49 PM      Component Value Range Status Comment   Specimen Description ABSCESS URINE LEFT   Final    Special Requests NONE   Final    Gram Stain     Final    Value: ABUNDANT WBC PRESENT,BOTH PMN AND MONONUCLEAR     NO SQUAMOUS EPITHELIAL CELLS SEEN     ABUNDANT GRAM POSITIVE RODS     FEW YEAST   Culture MODERATE CANDIDA ALBICANS   Final    Report Status PENDING   Incomplete   CULTURE, ROUTINE-ABSCESS     Status: Normal (Preliminary result)   Collection Time   01/10/12  2:50 PM      Component Value Range Status Comment   Specimen Description ABSCESS URINE RIGHT   Final    Special Requests NONE   Final    Gram Stain     Final    Value: ABUNDANT WBC PRESENT,BOTH PMN AND MONONUCLEAR     NO SQUAMOUS EPITHELIAL CELLS SEEN     ABUNDANT GRAM POSITIVE RODS     FEW YEAST   Culture MODERATE CANDIDA ALBICANS   Final     Report Status PENDING   Incomplete      Labs: Basic Metabolic Panel:  Lab 01/12/12 0960 01/11/12 0544 01/10/12 0643 01/09/12 1611  NA 140 137 137 135  K 4.0 3.4* 4.2 5.8*  CL 109 102 105 107  CO2 23 22 17* 11*  GLUCOSE 92 114* 102* 94  BUN 61* 86* 109* 113*  CREATININE 3.97* 4.97* 6.28* 6.32*  CALCIUM 8.3* 8.2* 9.3 10.1  MG -- -- -- 2.4  PHOS -- 3.6 -- 6.0*   Liver Function Tests:  Lab 01/09/12 1611  AST 19  ALT 31  ALKPHOS 116  BILITOT 0.2*  PROT 7.8  ALBUMIN 3.2*   CBC:  Lab 01/09/12 1611  WBC 8.9  NEUTROABS 7.5  HGB 9.8*  HCT 29.7*  MCV 89.7  PLT 472*   CBG:  Lab 01/10/12 0521 01/10/12 0158  GLUCAP 100* 101*    Time coordinating discharge: 35 minutes.  Signed:  Barre Aydelott  Pager [phone] [facility] 01/12/2012, 2:55 PM

## 2012-01-12 NOTE — Progress Notes (Signed)
Subjective: Pt ok. Overall feeling better  Objective: Physical Exam: BP 121/70  Pulse 82  Temp 98.8 F (37.1 C) (Oral)  Resp 16  Ht 5\' 9"  (1.753 m)  Wt 130 lb 1.1 oz (59 kg)  BMI 19.21 kg/m2  SpO2 100% (L)PCN intact, site clean, NT  Sl blood tinged UOP (R)PCN intact, site clean, NT, clear UOP   Labs: CBC  Basename 01/09/12 1611  WBC 8.9  HGB 9.8*  HCT 29.7*  PLT 472*   BMET  Basename 01/12/12 0515 01/11/12 0544  NA 140 137  K 4.0 3.4*  CL 109 102  CO2 23 22  GLUCOSE 92 114*  BUN 61* 86*  CREATININE 3.97* 4.97*  CALCIUM 8.3* 8.2*   LFT  Basename 01/09/12 1611  PROT 7.8  ALBUMIN 3.2*  AST 19  ALT 31  ALKPHOS 116  BILITOT 0.2*  BILIDIR 0.1  IBILI 0.1*  LIPASE --   PT/INR  Basename 01/10/12 0643  LABPROT 14.8  INR 1.18     Studies/Results: Ir Perc Nephrostomy Left  01/10/2012  *RADIOLOGY REPORT*  Clinical history:71 year old with complicated medical history including rectosigmoid cancer and status post multiple surgeries. The patient has bilateral hydronephrosis and elevated creatinine despite having bilateral ureteral stents.  PROCEDURE(S): BILATERAL PERCUTANEOUS NEPHROSTOMY TUBE PLACEMENTS WITH ULTRASOUND AND FLUOROSCOPIC GUIDANCE  Physician: Rachelle Hora. Lowella Dandy, MD  Medications:Ciprofloxacin 400 mg IV. Versed 3 mg, Fentanyl 150 mcg. A radiology nurse monitored the patient for moderate sedation. Ciprofloxacin was given within two hours of incision.  Moderate sedation time:25 minutes  Fluoroscopy time: 1.00 minutes  Procedure:The procedure was explained to the patient.  The risks and benefits of the procedure were discussed and the patient's questions were addressed.  Informed consent was obtained from the patient.  The patient was placed prone on the interventional table. Ultrasound demonstrated bilateral hydronephrosis.  Both flanks were prepped and draped in a sterile fashion.  Maximal barrier sterile technique was utilized including caps, mask, sterile gowns,  sterile gloves, sterile drape, hand hygiene and skin antiseptic.  The left flank scan was anesthetized with lidocaine.  A 21 gauge needle was directed into a dilated calix with ultrasound guidance.  A 0.018 wire was advanced into the renal pelvis.  A dilator set was placed. The tract was dilated and a 10-French multipurpose drain was placed within the renal pelvis.  Yellow cloudy fluid was aspirated and sent for Gram stain and culture.  The catheter was sutured to the skin and attached to a gravity bag.  Attention was directed to the right kidney.  A new 21 gauge needle was directed into a dilated calix with ultrasound guidance.  A dilator set was placed.  Tract was dilated and a 10-French multipurpose drain was reconstituted in the renal pelvis.  20 ml of blood tinged purulent fluid was aspirated. Catheter sutured to the skin and attached to a gravity bag.  Fluoroscopic and ultrasound images were taken and saved for documentation.  Findings:Bilateral moderate hydronephrosis.  Bilateral nephrostomy tubes were placed.  Cloudy yellow fluid was removed from both kidneys.  Findings are concerning for pyonephrosis.  Complications: None  Impression:Successful bilateral percutaneous nephrostomy tube placements with ultrasound and fluoroscopic guidance.   Original Report Authenticated By: Richarda Overlie, M.D.    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 The patient has bilateral hydronephrosis and elevated creatinine despite having bilateral ureteral stents.  PROCEDURE(S): BILATERAL PERCUTANEOUS NEPHROSTOMY TUBE  PLACEMENTS WITH ULTRASOUND AND FLUOROSCOPIC GUIDANCE  Physician: Rachelle Hora. Lowella Dandy, MD  Medications:Ciprofloxacin 400 mg IV. Versed 3 mg, Fentanyl 150 mcg. A radiology nurse monitored the patient for moderate sedation. Ciprofloxacin was given within two hours of incision.   Moderate sedation time:25 minutes  Fluoroscopy time: 1.00 minutes  Procedure:The procedure was explained to the patient.  The risks and benefits of the procedure were discussed and the patient's questions were addressed.  Informed consent was obtained from the patient.  The patient was placed prone on the interventional table. Ultrasound demonstrated bilateral hydronephrosis.  Both flanks were prepped and draped in a sterile fashion.  Maximal barrier sterile technique was utilized including caps, mask, sterile gowns, sterile gloves, sterile drape, hand hygiene and skin antiseptic.  The left flank scan was anesthetized with lidocaine.  A 21 gauge needle was directed into a dilated calix with ultrasound guidance.  A 0.018 wire was advanced into the renal pelvis.  A dilator set was placed. The tract was dilated and a 10-French multipurpose drain was placed within the renal pelvis.  Yellow cloudy fluid was aspirated and sent for Gram stain and culture.  The catheter was sutured to the skin and attached to a gravity bag.  Attention was directed to the right kidney.  A new 21 gauge needle was directed into a dilated calix with ultrasound guidance.  A dilator set was placed.  Tract was dilated and a 10-French multipurpose drain was reconstituted in the renal pelvis.  20 ml of blood tinged purulent fluid was aspirated. Catheter sutured to the skin and attached to a gravity bag.  Fluoroscopic and ultrasound images were taken and saved for documentation.  Findings:Bilateral moderate hydronephrosis.  Bilateral nephrostomy tubes were placed.  Cloudy yellow fluid was removed from both kidneys.  Findings are concerning for pyonephrosis.  Complications: None  Impression:Successful bilateral percutaneous nephrostomy tube placements with ultrasound and fluoroscopic guidance.   Original Report Authenticated By: Richarda Overlie, M.D.    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 The patient has bilateral hydronephrosis and elevated creatinine despite having bilateral ureteral stents.  PROCEDURE(S): BILATERAL PERCUTANEOUS NEPHROSTOMY TUBE PLACEMENTS WITH ULTRASOUND AND FLUOROSCOPIC GUIDANCE  Physician: Rachelle Hora. Lowella Dandy, MD  Medications:Ciprofloxacin 400 mg IV. Versed 3 mg, Fentanyl 150 mcg. A radiology nurse monitored the patient for moderate sedation. Ciprofloxacin was given within two hours of incision.  Moderate sedation time:25 minutes  Fluoroscopy time: 1.00 minutes  Procedure:The procedure was explained to the patient.  The risks and benefits of the procedure were discussed and the patient's questions were addressed.  Informed consent was obtained from the patient.  The patient was placed prone on the interventional table. Ultrasound demonstrated bilateral hydronephrosis.  Both flanks were prepped and draped in a sterile fashion.  Maximal barrier sterile technique was utilized including caps, mask, sterile gowns, sterile gloves, sterile drape, hand hygiene and skin antiseptic.  The left flank scan was anesthetized with lidocaine.  A 21 gauge needle was directed into a dilated calix with ultrasound guidance.  A 0.018 wire was advanced into the renal pelvis.  A dilator set was placed. The tract was dilated and a 10-French multipurpose drain was placed within the renal pelvis.  Yellow cloudy fluid was aspirated and sent for Gram stain and culture.  The catheter was sutured to the skin and attached to a gravity bag.  Attention was directed to the right kidney.  A new 21 gauge needle was directed into a dilated calix with ultrasound guidance.  A dilator set was placed.  Tract was dilated and a 10-French multipurpose drain was reconstituted in the renal pelvis.  20 ml of blood tinged purulent fluid was aspirated. Catheter sutured to the skin and attached to a gravity bag.  Fluoroscopic and ultrasound  images were taken and saved for documentation.  Findings:Bilateral moderate hydronephrosis.  Bilateral nephrostomy tubes were placed.  Cloudy yellow fluid was removed from both kidneys.  Findings are concerning for pyonephrosis.  Complications: None  Impression:Successful bilateral percutaneous nephrostomy tube placements with ultrasound and fluoroscopic guidance.   Original Report Authenticated By: Richarda Overlie, M.D.    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 The patient has bilateral hydronephrosis and elevated creatinine despite having bilateral ureteral stents.  PROCEDURE(S): BILATERAL PERCUTANEOUS NEPHROSTOMY TUBE PLACEMENTS WITH ULTRASOUND AND FLUOROSCOPIC GUIDANCE  Physician: Rachelle Hora. Lowella Dandy, MD  Medications:Ciprofloxacin 400 mg IV. Versed 3 mg, Fentanyl 150 mcg. A radiology nurse monitored the patient for moderate sedation. Ciprofloxacin was given within two hours of incision.  Moderate sedation time:25 minutes  Fluoroscopy time: 1.00 minutes  Procedure:The procedure was explained to the patient.  The risks and benefits of the procedure were discussed and the patient's questions were addressed.  Informed consent was obtained from the patient.  The patient was placed prone on the interventional table. Ultrasound demonstrated bilateral hydronephrosis.  Both flanks were prepped and draped in a sterile fashion.  Maximal barrier sterile technique was utilized including caps, mask, sterile gowns, sterile gloves, sterile drape, hand hygiene and skin antiseptic.  The left flank scan was anesthetized with lidocaine.  A 21 gauge needle was directed into a dilated calix with ultrasound guidance.  A 0.018 wire was advanced into the renal pelvis.  A dilator set was placed. The tract was dilated and a 10-French multipurpose drain was placed within the renal pelvis.  Yellow cloudy  fluid was aspirated and sent for Gram stain and culture.  The catheter was sutured to the skin and attached to a gravity bag.  Attention was directed to the right kidney.  A new 21 gauge needle was directed into a dilated calix with ultrasound guidance.  A dilator set was placed.  Tract was dilated and a 10-French multipurpose drain was reconstituted in the renal pelvis.  20 ml of blood tinged purulent fluid was aspirated. Catheter sutured to the skin and attached to a gravity bag.  Fluoroscopic and ultrasound images were taken and saved for documentation.  Findings:Bilateral moderate hydronephrosis.  Bilateral nephrostomy tubes were placed.  Cloudy yellow fluid was removed from both kidneys.  Findings are concerning for pyonephrosis.  Complications: None  Impression:Successful bilateral percutaneous nephrostomy tube placements with ultrasound and fluoroscopic guidance.   Original Report Authenticated By: Richarda Overlie, M.D.     Assessment/Plan: Obstructive uropathy with recurrent bilateral hydronephrosis. S/p Bilateral PCN, good function Cr down to 3.9  Cont to follow  LOS: 3 days    Brayton El PA-C 01/12/2012 11:18 AM

## 2012-01-12 NOTE — Care Management Note (Signed)
    Page 1 of 1   01/12/2012     3:59:19 PM   CARE MANAGEMENT NOTE 01/12/2012  Patient:  Eric Munoz, Eric Munoz   Account Number:  [id number]  Date Initiated:  01/12/2012  Documentation initiated by:  Lanier Clam  Subjective/Objective Assessment:   ADMITTED W/ACUTE RENAL FAILURE.     Action/Plan:   FROM HOME.READMIT.   Anticipated DC Date:  01/12/2012   Anticipated DC Plan:  HOME W HOME HEALTH SERVICES      DC Planning Services  CM consult      Choice offered to / List presented to:  C-1 Patient        HH arranged  HH-1 RN      [facility] agency  [facility]   Status of service:  Completed, signed off Medicare Important Message given?   (If response is "NO", the following Medicare IM given date fields will be blank) Date Medicare IM given:   Date Additional Medicare IM given:    Discharge Disposition:  HOME W HOME HEALTH SERVICES  Per UR Regulation:  Reviewed for med. necessity/level of care/duration of stay  If discussed at Long Length of Stay Meetings, dates discussed:    Comments:  01/12/12 Ahriyah Vannest RN,BSN NCM [phone] [facility] CHOSEN SUSAN (LIASON) AWARE OF D/C,HH ORDER,HHRN.

## 2012-01-13 LAB — CULTURE, ROUTINE-ABSCESS

## 2012-03-05 ENCOUNTER — Ambulatory Visit (HOSPITAL_BASED_OUTPATIENT_CLINIC_OR_DEPARTMENT_OTHER): Payer: BC Managed Care – PPO | Admitting: Oncology

## 2012-03-05 ENCOUNTER — Encounter: Payer: Self-pay | Admitting: Oncology

## 2012-03-05 ENCOUNTER — Other Ambulatory Visit (HOSPITAL_BASED_OUTPATIENT_CLINIC_OR_DEPARTMENT_OTHER): Payer: BC Managed Care – PPO | Admitting: Lab

## 2012-03-05 VITALS — BP 125/71 | HR 97 | Temp 97.6°F | Resp 18 | Ht 69.0 in | Wt 145.8 lb

## 2012-03-05 DIAGNOSIS — D539 Nutritional anemia, unspecified: Secondary | ICD-10-CM

## 2012-03-05 DIAGNOSIS — D649 Anemia, unspecified: Secondary | ICD-10-CM

## 2012-03-05 DIAGNOSIS — Z85038 Personal history of other malignant neoplasm of large intestine: Secondary | ICD-10-CM

## 2012-03-05 LAB — COMPREHENSIVE METABOLIC PANEL (CC13)
ALT: 15 U/L (ref 0–55)
BUN: 48.8 mg/dL — ABNORMAL HIGH (ref 7.0–26.0)
CO2: 21 mEq/L — ABNORMAL LOW (ref 22–29)
Calcium: 9.6 mg/dL (ref 8.4–10.4)
Chloride: 111 mEq/L — ABNORMAL HIGH (ref 98–107)
Creatinine: 2.3 mg/dL — ABNORMAL HIGH (ref 0.7–1.3)
Glucose: 86 mg/dl (ref 70–99)

## 2012-03-05 LAB — CBC WITH DIFFERENTIAL/PLATELET
Eosinophils Absolute: 0.2 10*3/uL (ref 0.0–0.5)
HCT: 28.3 % — ABNORMAL LOW (ref 38.4–49.9)
LYMPH%: 8.9 % — ABNORMAL LOW (ref 14.0–49.0)
MONO#: 0.6 10*3/uL (ref 0.1–0.9)
NEUT#: 3.9 10*3/uL (ref 1.5–6.5)
NEUT%: 75.2 % — ABNORMAL HIGH (ref 39.0–75.0)
Platelets: 268 10*3/uL (ref 140–400)
WBC: 5.1 10*3/uL (ref 4.0–10.3)

## 2012-03-05 LAB — IRON AND TIBC
%SAT: 7 % — ABNORMAL LOW (ref 20–55)
Iron: 24 ug/dL — ABNORMAL LOW (ref 42–165)
TIBC: 333 ug/dL (ref 215–435)

## 2012-03-05 NOTE — Progress Notes (Signed)
This office note has been dictated.  #[id number]

## 2012-03-06 ENCOUNTER — Encounter: Payer: Self-pay | Admitting: Oncology

## 2012-03-06 NOTE — Progress Notes (Signed)
Ferritin on 03/05/2012 came back 32 with iron saturation of 7%. On 11/04/2011 ferritin was 209 and iron saturation was 4%.  In looking through the patient's previous record, he received IV Feraheme 510 mg on 09/26/2009 and on 10/10/2009.  I have ordered IV Feraheme 510 mg 4 03/11/2012.

## 2012-03-06 NOTE — Progress Notes (Signed)
CC:   [name], MD Radene Gunning, M.D., Ph.D. [name], M.D. [name], M.D. [name], M.D. [name], M.D.   PROBLEM LIST:  1. 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 Diagnosis   was established in July of 2011. The patient underwent rather  extensive surgery on both July 5th and August 08, 2009. There were  multiple tumor nodules. Six out of 26 lymph nodes were involved  along with adjacent small bowel. There was involvement of a  periaortic lymph node. There was a close radial margin. There was  tumor thrombus in a vein. There was extracapsular extension,  lymphovascular and perineural invasion, invasion of tumor into the  left external iliac artery and left ureter. KRAS mutation was not  detected, and thus the tumor is KRAS wild type. Surgery involved a  colostomy and placement of a left ureteral stent. There was a  question of whether this represented a de novo cancer or possibly  recurrence from a previous sigmoid well-differentiated  adenocarcinoma that was resected by Dr. [name] on  03/15/2004. This tumor arose apparently in a polyp with 0/3 lymph  nodes and was felt to be T1 stage I. The margin, however, was 0.5  mm. After the patient's surgery in July 2011, he received 7 cycles of FOLFOX from 09/26/2009 through 12/24/2009. The patient received pelvic radiation with continuous infusion 5-fluorouracil  from 01/07/2010 through 02/15/2009. The patient then received 3  cycles of 5-FU leucovorin and 5-FU by continuous infusion from  03/04/2010 through 04/01/2010. 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 PET scan carried out on 06/16/2011  showed no evidence of recurrent or metastatic disease.   2. Ileostomy, 12/20/2010.  3. Right leg weakness secondary to femoral nerve injuries following  the patient's surgery in July 2011. Currently  resolved.  4. The patient underwent extensive surgery on December 20, 2010. This  consisted of lysis of adhesions, laparoscopic-assisted splenic  flexure mobilization, laparoscopic colostomy takedown, primary  repair of bladder injury, diverting loop ileostomy in the right  upper quadrant, and removal of the patient's Port-A-Cath.  5. Bilateral ureteral stents, most recently exchanged on 10/24/2011.  6. 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  7. Progressive weight loss. 8. Anemia noted in January 2013 with a prior history of iron     deficiency anemia requiring intravenous Feraheme 510 mg      on 09/26/2009 and on 10/10/2009. Renal insufficiency is probably             contributing to this patient's anemia at the present time. 9. Development of renal insufficiency noted in May 2013 with acute decompensation noted on 11/04/2011.  10. Bilateral percutaneous nephrostomies carried out on January 10, 2012 for bilateral hydronephrosis.    MEDICATIONS:  Reviewed and recorded. Current Outpatient Prescriptions  Medication Sig Dispense Refill  . lactose free nutrition (BOOST PLUS) LIQD Take 237 mLs by mouth at bedtime.      . Multiple Vitamin (MULTIVITAMIN WITH MINERALS) TABS Take 1 tablet by mouth daily.      . nutritional supplement (BOOST HIGH PROTEIN) LIQD Take 1 Can by mouth daily with breakfast. Per patient taking supplement that is in plastic bottle. Was not an option in database.      . Sodium Citrate Dihydrate (CITRA PH PO) Take 5 mLs by mouth 3 (three) times daily after meals.  SMOKING HISTORY: The patient smoked a pack cigarettes a day for about  15 years. He stopped smoking in 1976.     HISTORY:  Eric Munoz was seen today for followup of his adenocarcinoma of the rectosigmoid, stage IV, TIVb N2a MIa with carcinoma involving periaortic lymph nodes at the time of surgery in early July 2011.  The patient's tumor is K-ras wild type.  A PET scan carried out  on 06/16/2011 showed no evidence for recurrent cancer  The patient was last seen by [place] on 11/04/2011.  Today he is accompanied by his wife, [name].  Although the patient seems to be doing well at the present time with no complaints, he has had an eventful course over the past few months.  When we saw the patient on 11/04/2011, his BUN was 77 and creatinine 5.1.  Hemoglobin was 9.1, hematocrit 27.8.  The patient did not look well.  He apparently was evaluated for ureteral obstruction.  An ultrasound carried out by interventional radiology showed no significant hydronephrosis.  The patient's BUN and creatinine apparently improved without intervention.  The patient was briefly hospitalized from 11/17 through 11/18 because of a supraventricular tachycardia, felt to be secondary to a urinary tract infection.  The patient then required admission to the hospital from 01/09/2012 through 01/12/2012 because of a BUN of 113, a creatinine of 6.32, potassium 5.8, and a metabolic acidosis with a CO2 content of 11. The patient underwent a renal ultrasound on 01/09/2012 that showed moderate bilateral hydronephrosis despite bilateral ureteral stents in place.  On the following day, 12/07, the patient had bilateral nephrostomies carried out.  The patient's renal function has improved.  It will be recalled that the patient still has a colovesical fistula that was detected in December 2012.  At the present time he has a Foley catheter in place.  The patient states that this is draining about 900 mL of urine per day.  In addition, he has bilateral nephrostomies with each draining approximately 300-400 cc a day.  The patient also has an ileostomy that was created on 12/20/2010.  The patient denies any pain or any symptoms to suggest recurrence of his cancer.  Energy and appetite are good, and he has gained some weight over the past 3 months.  PHYSICAL EXAMINATION:  The patient looks fairly well.  Weight is  145.8 pounds.  It will be recalled that at one point going back to 2010 the patient weighed 180 pounds, and in August of 2011 he weighed about 150 pounds.  His weight has gotten up to about 165 pounds back in December 2011.  Height 5 feet 9 inches.  Body surface area 1.79 sq m.  Blood pressure 125/71.  Other vital signs are normal.  There is no scleral icterus.  Mouth and pharynx are benign.  There is no peripheral adenopathy palpable.  Heart/Lungs:  Normal.  There is a pectus excavatum.  The patient no longer has a Port-A-Cath.  Abdomen is fairly benign except for an ileostomy bag in the right mid abdomen.  The patient has an indwelling Foley catheter.  He also has bilateral nephrostomy tubes attached to closed drainage.  No peripheral edema or clubbing.  He has some purpura over the hands.  Neurologic:  Normal.  LABORATORY DATA:  The laboratory dated today:  White count 5.1, ANC 3.9, hemoglobin 9.7, hematocrit 28.3, and platelets 268,000.  Chemistries today notable for a BUN of 49, creatinine 2.3, potassium 4.6, albumin 3.3, LDH 84.  CEA is pending.  CEA from 11/04/2011 was 1.1.  IMAGING STUDIES:  1. PET scan from 11/29/2010 showed no evidence for recurrent or  metastatic cancer.  2. CT scan of abdomen and pelvis without IV contrast on 01/16/2011  showed right perinephric fluid collections. One of these was  opacified, the other was not. It was felt that these collections  could be urinomas or abscesses. The patient was noted to have a  right percutaneous nephrostomy tube with bilateral internal  ureteral stents. Gas was noted within the collecting system in the  right kidney, suggesting infection.  3. CT-guided drain placement into 2 separate right perinephric fluid  collections carried out on 01/16/2011. Fluid collections  surrounded the right kidney. These fluid collections was  successfully decompressed. 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 Fluid in the collections did  not appear to be frankly  infected. Samples were sent for Gram stain and culture.  4. Cystogram carried out on 04/30/2011 showed patent colovesical  fistula arising along the right posterolateral aspect of the  bladder.  5. PET scan from 06/16/2011 showed no evidence of recurrent or  metastatic disease.  6. Renal/urinary tract ultrasound complete from 06/20/2011 showed  normal appearance of the renal parenchyma and no hydronephrosis.  The bladder was decompressed by a Foley catheter.  7. CT scan of the pelvis without IV contrast carried out on 12/21/2011 showed a Foley catheter within the distal colon with administered contrast opacifying the colon, cecum, and appendix.  Bilateral ureteral stents were present as well as stable postoperative pelvic changes. 8. Renal/urinary tract ultrasound completed on 01/09/2012 showed moderate bilateral hydronephrosis with bilateral ureteral stents in place.  There was a moderately enlarged prostate gland.   PROCEDURES:   1. Cystoscopy and bilateral ureteral catheter stent exchanges  carried out by Dr. [name] on 06/19/2011 and 10/24/2011. 2. Bilateral percutaneous nephrostomy tube placements with ultrasound and fluoroscopic guidance carried out on 01/10/2012.  These procedures were carried out by interventional radiology.   IMPRESSION/PLAN:  From a clinical perspective, Mr. Dishman seems to be doing well.  Unfortunately, he is a very complicated situation.  It is perplexing to me that he is having substantial urinary output through his Foley catheter.  The patient estimates this to be about 900 mL.  At the same time, he is also having significant urine production coming from his bilateral nephrostomies.  He estimates this to be about 300-400 mL from each side.  As stated, this is rather perplexing and certainly suggests that there is patency at least of one of his ureteral stents, if not both.  This may require further evaluation with  renal ultrasound and perhaps some contrast studies  The patient was to have undergone repair of his colovesical fistula at [facility] by Dr. Vonita Moss.  The patient and his wife expressed some uncertainty as to whether they want to go ahead with surgery. Apparently, Dr. Vonita Moss talked to the patient recently about the need for cystectomy and radical urinary diversion.  I do not think the patient is very enthusiastic about this.  In any event, these issues need to be resolved.  For the moment, the patient has satisfactory renal function, although clearly impaired.  I understand he is seeing Dr. Arrie Aran to monitor his renal function, which at this point, cannot be attributed to an obstructive uropathy.  The good news is that the patient seems to be disease free from his stage IV colon cancer, now over 2-1/2 years from the time of his surgery in early July  of 2011.  We will plan to see Mr. Harner again in early May, at which time we will check a CBC, chemistries, CEA and iron studies.  Iron studies are pending from today.  I should mention that ferritin from 11/04/2011 was 209.  However, his iron was 11, and iron saturation was 4%.  TIBC was 264 on 11/04/2011.  If the patient were to be treated with Aranesp, he may need some supplemental IV iron.  At the moment he seems to be tolerating his mild anemia fairly well.  As stated, his hemoglobin today is 9.7 and seems to be fairly stable over the past several months.  We will probably want to repeat either a PET scan or CT scans at some point, probably in May, as the patient's most recent PET scan was carried out on Jun 16, 2011.    ______________________________ Samul Dada, M.D. DSM/MEDQ  D:  03/05/2012  T:  03/06/2012  Job:  [id number]

## 2012-03-08 ENCOUNTER — Telehealth: Payer: Self-pay | Admitting: Medical Oncology

## 2012-03-08 NOTE — Telephone Encounter (Signed)
I called pt per Dr. Arline Asp to let him know that his ferritin is low and he has ordered for him to get IV feraheme. He has gotten feraheme in the past. He is aware the schedulers will call him with a date and time.

## 2012-03-09 ENCOUNTER — Telehealth: Payer: Self-pay

## 2012-03-09 ENCOUNTER — Telehealth: Payer: Self-pay | Admitting: Oncology

## 2012-03-09 NOTE — Telephone Encounter (Signed)
We had received fax asking for pts DOB. [facility] [place] office with DOB.

## 2012-03-09 NOTE — Telephone Encounter (Signed)
Sent michelle a staff message to add the pt in for some iv iron on 03/11/2012.

## 2012-03-10 ENCOUNTER — Telehealth: Payer: Self-pay | Admitting: *Deleted

## 2012-03-10 ENCOUNTER — Telehealth: Payer: Self-pay | Admitting: Oncology

## 2012-03-10 NOTE — Telephone Encounter (Signed)
Per staff message and POF I have scheduled appts.  JMW  

## 2012-03-10 NOTE — Telephone Encounter (Signed)
lmonvm advising the pt of his iron iv infusion tomorrow 03/11/2012@9 :30am

## 2012-03-11 ENCOUNTER — Ambulatory Visit (HOSPITAL_BASED_OUTPATIENT_CLINIC_OR_DEPARTMENT_OTHER): Payer: BC Managed Care – PPO

## 2012-03-11 VITALS — BP 135/76 | HR 69 | Temp 97.1°F | Resp 18

## 2012-03-11 DIAGNOSIS — D649 Anemia, unspecified: Secondary | ICD-10-CM

## 2012-03-11 DIAGNOSIS — D539 Nutritional anemia, unspecified: Secondary | ICD-10-CM

## 2012-03-11 MED ORDER — FERUMOXYTOL INJECTION 510 MG/17 ML
510.0000 mg | Freq: Once | INTRAVENOUS | Status: AC
  Administered 2012-03-11: 510 mg via INTRAVENOUS
  Filled 2012-03-11: qty 17

## 2012-03-11 MED ORDER — SODIUM CHLORIDE 0.9 % IV SOLN
Freq: Once | INTRAVENOUS | Status: AC
  Administered 2012-03-11: 20 mL via INTRAVENOUS

## 2012-03-29 ENCOUNTER — Other Ambulatory Visit: Payer: Self-pay | Admitting: Urology

## 2012-03-31 ENCOUNTER — Other Ambulatory Visit: Payer: Self-pay | Admitting: Urology

## 2012-04-05 ENCOUNTER — Encounter (HOSPITAL_BASED_OUTPATIENT_CLINIC_OR_DEPARTMENT_OTHER): Payer: Self-pay | Admitting: *Deleted

## 2012-04-05 ENCOUNTER — Other Ambulatory Visit: Payer: Self-pay | Admitting: Urology

## 2012-04-05 ENCOUNTER — Ambulatory Visit (HOSPITAL_COMMUNITY)
Admission: RE | Admit: 2012-04-05 | Discharge: 2012-04-05 | Disposition: A | Discharge status: Home / Self Care | Payer: BC Managed Care – PPO | Source: Ambulatory Visit | Attending: Urology | Admitting: Urology

## 2012-04-05 DIAGNOSIS — N1330 Unspecified hydronephrosis: Secondary | ICD-10-CM

## 2012-04-05 DIAGNOSIS — Z436 Encounter for attention to other artificial openings of urinary tract: Secondary | ICD-10-CM | POA: Insufficient documentation

## 2012-04-05 DIAGNOSIS — C19 Malignant neoplasm of rectosigmoid junction: Secondary | ICD-10-CM | POA: Insufficient documentation

## 2012-04-05 MED ORDER — IOHEXOL 300 MG/ML  SOLN
20.0000 mL | Freq: Once | INTRAMUSCULAR | Status: AC | PRN
  Administered 2012-04-05: 20 mL

## 2012-04-05 MED ORDER — LIDOCAINE HCL 1 % IJ SOLN
INTRAMUSCULAR | Status: AC
  Filled 2012-04-05: qty 20

## 2012-04-05 NOTE — Progress Notes (Addendum)
NPO AFTER MN. ARRIVES AT 1045. NEEDS ISTAT. CURRENT EKG IN CHART AND EPIC.  REVIEWED CHART W/ DR ROSE, OK TO PROCEED, CONCERNING DIFFICULT AIRWAY, PT DID OK W/ LMA 10-24-2011 AT OUT FACILITY [facility])

## 2012-04-09 ENCOUNTER — Ambulatory Visit (HOSPITAL_BASED_OUTPATIENT_CLINIC_OR_DEPARTMENT_OTHER)
Admission: RE | Admit: 2012-04-09 | Discharge: 2012-04-09 | Disposition: A | Discharge status: Home / Self Care | Payer: BC Managed Care – PPO | Source: Ambulatory Visit | Attending: Urology | Admitting: Urology

## 2012-04-09 ENCOUNTER — Ambulatory Visit (HOSPITAL_BASED_OUTPATIENT_CLINIC_OR_DEPARTMENT_OTHER): Payer: BC Managed Care – PPO | Admitting: Anesthesiology

## 2012-04-09 ENCOUNTER — Encounter (HOSPITAL_BASED_OUTPATIENT_CLINIC_OR_DEPARTMENT_OTHER): Payer: Self-pay | Admitting: *Deleted

## 2012-04-09 ENCOUNTER — Encounter (HOSPITAL_BASED_OUTPATIENT_CLINIC_OR_DEPARTMENT_OTHER)
Admission: RE | Disposition: A | Discharge status: Home / Self Care | Payer: Self-pay | Source: Ambulatory Visit | Attending: Urology

## 2012-04-09 ENCOUNTER — Encounter (HOSPITAL_BASED_OUTPATIENT_CLINIC_OR_DEPARTMENT_OTHER): Payer: Self-pay | Admitting: Anesthesiology

## 2012-04-09 DIAGNOSIS — Z85038 Personal history of other malignant neoplasm of large intestine: Secondary | ICD-10-CM | POA: Insufficient documentation

## 2012-04-09 DIAGNOSIS — N183 Chronic kidney disease, stage 3 unspecified: Secondary | ICD-10-CM | POA: Insufficient documentation

## 2012-04-09 DIAGNOSIS — N1330 Unspecified hydronephrosis: Secondary | ICD-10-CM | POA: Insufficient documentation

## 2012-04-09 DIAGNOSIS — Z936 Other artificial openings of urinary tract status: Secondary | ICD-10-CM | POA: Insufficient documentation

## 2012-04-09 DIAGNOSIS — Z466 Encounter for fitting and adjustment of urinary device: Secondary | ICD-10-CM | POA: Insufficient documentation

## 2012-04-09 DIAGNOSIS — Z923 Personal history of irradiation: Secondary | ICD-10-CM | POA: Insufficient documentation

## 2012-04-09 HISTORY — PX: CYSTOSCOPY W/ URETERAL STENT PLACEMENT: SHX1429

## 2012-04-09 HISTORY — DX: Chronic kidney disease, stage 3 (moderate): N18.3

## 2012-04-09 HISTORY — DX: Other artificial openings of urinary tract status: Z93.6

## 2012-04-09 HISTORY — DX: Chronic kidney disease, stage 3 unspecified: N18.30

## 2012-04-09 LAB — POCT I-STAT 4, (NA,K, GLUC, HGB,HCT)
Glucose, Bld: 91 mg/dL (ref 70–99)
HCT: 33 % — ABNORMAL LOW (ref 39.0–52.0)
Potassium: 4.5 mEq/L (ref 3.5–5.1)

## 2012-04-09 SURGERY — CYSTOSCOPY, FLEXIBLE, WITH STENT REPLACEMENT
Anesthesia: General | Site: Ureter | Laterality: Bilateral | Wound class: Clean Contaminated

## 2012-04-09 MED ORDER — CIPROFLOXACIN HCL 250 MG PO TABS: 250.0000 mg | ORAL_TABLET | Freq: Two times a day (BID) | ORAL | Status: DC

## 2012-04-09 MED ORDER — SODIUM CHLORIDE 0.9 % IR SOLN
Status: DC | PRN
  Administered 2012-04-09 (×2): 3000 mL

## 2012-04-09 MED ORDER — SODIUM CHLORIDE 0.9 % IV SOLN
INTRAVENOUS | Status: DC
  Administered 2012-04-09: 11:00:00 via INTRAVENOUS
  Filled 2012-04-09: qty 1000

## 2012-04-09 MED ORDER — DEXAMETHASONE SODIUM PHOSPHATE 4 MG/ML IJ SOLN
INTRAMUSCULAR | Status: DC | PRN
  Administered 2012-04-09: 8 mg via INTRAVENOUS

## 2012-04-09 MED ORDER — LIDOCAINE HCL (CARDIAC) 20 MG/ML IV SOLN
INTRAVENOUS | Status: DC | PRN
  Administered 2012-04-09: 50 mg via INTRAVENOUS

## 2012-04-09 MED ORDER — PROMETHAZINE HCL 25 MG/ML IJ SOLN
6.2500 mg | INTRAMUSCULAR | Status: DC | PRN
  Filled 2012-04-09: qty 1

## 2012-04-09 MED ORDER — FENTANYL CITRATE 0.05 MG/ML IJ SOLN
INTRAMUSCULAR | Status: DC | PRN
  Administered 2012-04-09: 25 ug via INTRAVENOUS
  Administered 2012-04-09: 50 ug via INTRAVENOUS
  Administered 2012-04-09: 25 ug via INTRAVENOUS

## 2012-04-09 MED ORDER — PROPOFOL 10 MG/ML IV BOLUS
INTRAVENOUS | Status: DC | PRN
  Administered 2012-04-09: 150 mg via INTRAVENOUS

## 2012-04-09 MED ORDER — CIPROFLOXACIN IN D5W 400 MG/200ML IV SOLN
400.0000 mg | INTRAVENOUS | Status: AC
  Administered 2012-04-09: 400 mg via INTRAVENOUS
  Filled 2012-04-09: qty 200

## 2012-04-09 MED ORDER — FENTANYL CITRATE 0.05 MG/ML IJ SOLN
25.0000 ug | INTRAMUSCULAR | Status: DC | PRN
  Administered 2012-04-09: 25 ug via INTRAVENOUS
  Filled 2012-04-09: qty 1

## 2012-04-09 MED ORDER — ONDANSETRON HCL 4 MG/2ML IJ SOLN
INTRAMUSCULAR | Status: DC | PRN
  Administered 2012-04-09: 4 mg via INTRAVENOUS

## 2012-04-09 SURGICAL SUPPLY — 23 items
ADAPTER CATH URET PLST 4-6FR (CATHETERS) IMPLANT
ADPR CATH URET STRL DISP 4-6FR (CATHETERS)
BAG DRAIN URO-CYSTO SKYTR STRL (DRAIN) ×2 IMPLANT
BAG DRN UROCATH (DRAIN) ×1
BAG URINE DRAINAGE (UROLOGICAL SUPPLIES) ×1 IMPLANT
CANISTER SUCT LVC 12 LTR MEDI- (MISCELLANEOUS) ×1 IMPLANT
CATH COUDE FOLEY 2W 5CC 18FR (CATHETERS) ×1 IMPLANT
CATH INTERMIT  6FR 70CM (CATHETERS) IMPLANT
CLOTH BEACON ORANGE TIMEOUT ST (SAFETY) ×2 IMPLANT
DRAPE CAMERA CLOSED 9X96 (DRAPES) ×2 IMPLANT
GLOVE BIO SURGEON STRL SZ 6.5 (GLOVE) ×1 IMPLANT
GLOVE BIO SURGEON STRL SZ8 (GLOVE) ×2 IMPLANT
GLOVE BIOGEL PI IND STRL 6.5 (GLOVE) IMPLANT
GLOVE BIOGEL PI INDICATOR 6.5 (GLOVE) ×1
GLOVE ECLIPSE 6.0 STRL STRAW (GLOVE) ×1 IMPLANT
GOWN PREVENTION PLUS LG XLONG (DISPOSABLE) ×3 IMPLANT
GOWN STRL REIN XL XLG (GOWN DISPOSABLE) ×2 IMPLANT
GUIDEWIRE 0.038 PTFE COATED (WIRE) IMPLANT
GUIDEWIRE ANG ZIPWIRE 038X150 (WIRE) IMPLANT
GUIDEWIRE STR DUAL SENSOR (WIRE) IMPLANT
NS IRRIG 500ML POUR BTL (IV SOLUTION) IMPLANT
PACK CYSTOSCOPY (CUSTOM PROCEDURE TRAY) ×2 IMPLANT
STENT CONTOUR 7FRX24 (STENTS) ×2 IMPLANT

## 2012-04-09 NOTE — Interval H&P Note (Signed)
History and Physical Interval Note:  04/09/2012 11:46 AM  Eric Munoz  has presented today for surgery, with the diagnosis of BILATERAL HYDRONEPHOSIS  The various methods of treatment have been discussed with the patient and family. After consideration of risks, benefits and other options for treatment, the patient has consented to  Procedure(s) with comments: CYSTOSCOPY WITH STENT REPLACEMENT (Bilateral) - 1 HR  as a surgical intervention .  The patient's history has been reviewed, patient examined, no change in status, stable for surgery.  I have reviewed the patient's chart and labs.  Questions were answered to the patient's satisfaction.     Chelsea Aus

## 2012-04-09 NOTE — Transfer of Care (Signed)
Immediate Anesthesia Transfer of Care Note  Patient: Eric Munoz  Procedure(s) Performed: Procedure(s) (LRB): CYSTOSCOPY WITH bilateral  STENT REPLACEMENT (Bilateral)  Patient Location: PACU  Anesthesia Type: General  Level of Consciousness: awake, oriented, sedated and patient cooperative  Airway & Oxygen Therapy: Patient Spontanous Breathing and Patient connected to face mask oxygen  Post-op Assessment: Report given to PACU RN and Post -op Vital signs reviewed and stable  Post vital signs: Reviewed and stable  Complications: No apparent anesthesia complications

## 2012-04-09 NOTE — H&P (Addendum)
Urology History and Physical Exam  CC: Blocked kidneys  HPI: 72 year old male presents for cystoscopy and bilateral double-J stent exchange. His history is outlined below:  He had left ureterolysis on 08/07/2009. This was for a large colon cancer that was resected by Dr. Michaell Cowing. He has had a stent in since that time. He is having no difficulty with the stent. The patient underwent attempted colostomy takedown and reanastomosis. Patient did have a low rectal anastomosis, bu at the time of this procedure, there was a small bladder laceration. He also had new onset right hydronephrosis.  The hydronephrosis was treated with a stent that was placed antegrade. 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 That has been treated with Foley catheter drainage. In February, 2013, he underwent cystogram both by conventional means and with CT that revealed no evidence leakage from his bladder. This catheter was removed, but he had passage of urine through the rectum within a day.   On 06/19/2011, he had cystoscopy, double-J stent exchange, and an aborted fistula repair. Following rectal dilatation, he had his fistula open up significantly. Both Dr. Michaell Cowing and I felt that, because of this further fistula repair would best be performed by physicians more adept/experienced.  He has seen Dr. Gershon Mussel as well as a general surgeon at [facility]. He will be scheduled for possible fistula repair/graft interposition in the near future. He does think that he has a urinary tract infection.   He underwent repeat cystoscopy and double-J stent exchange bilaterally on 10/24/2011. That procedure went without difficulty. In followup, I checked a basic metabolic panel, which revealed his creatinine to be 4.97 on 11/12/2011. I put him on bicarbonate, because of acidosis, and had scheduled him to have percutaneous nephrostomy tubes placed. However, the order was missed because I put it in and properly, and  we waited a week before he could get to interventional radiology for that procedure. I got called by the radiologist that there was not significant hydronephrosis, however. His creatinine was rechecked on 11/20/2011 and was down to 3.2. His bicarbonate was still low at 15, however.   He now has bilateral nephrostomy tubes. By history, his creatinine is better. I have spoken with Dr. Shiela Mayer at [facility], who has held off on surgical repair of this fistula. He would like the patient to have his stents taken out, and eventually possibly have a cystectomy. Eric Munoz does not want to have his stents taken out yet, and would like to consider eventual nephrostomy removal. He recently had his nephrostomy tubes changed, and at this time presents for cystoscopy and double-J stent exchange.  PMH: Past Medical History  Diagnosis Date  . Easy bruising   . 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 S/P chemotherapy, time since greater than 12 weeks 03-2010 last chemo  . S/P radiation > 12 weeks 02-2010    last radiation  . Urinoma, s/p bladder repair 01/20/2011  . History of acute renal failure nov 2012  . Hydronephrosis, bilateral     Status post surgical intervention  . Colon cancer 2007, recurrent Jul2011    Recurrent, Stage 4, [id number] (Left colectomy/LAR , ChemoTx completed Feb2012/ XRT completed Jan2012  . Colovesical fistula   . Renal insufficiency   . S/P ileostomy   . Foley catheter in place   . Difficult intubation NOTED ASA IV W/ MAY 2013 AT [facility] MAIN OR--  INTUBATED  OVER BOUGIE  BY DR FORTUNE    PT DID OK W/ LMA AT [facility] 10-24-2011  -- DR ROSE  . Nephrostomy status     CURRENTLY HAS BILATERAL TUBES  . Chronic kidney disease (CKD), stage III (moderate)       NEPHROLOGIST,  DR COLADONATO  . Normocytic anemia     PSH: Past Surgical History  Procedure Laterality Date  . Portacath placement      09-17-09 right chest/ states removed  12-20-10  . Colostomy takedown  12/20/2010    Procedure: LAPAROSCOPIC COLOSTOMY TAKEDOWN;  Surgeon: [name], MD;  Location: [facility]S;  Service: General;  Laterality: N/A;  Laparoscopy Colostomy Takedownn With Ileostomy  . Port-a-cath removal  12/20/2010    Procedure: REMOVAL PORT-A-CATH;  Surgeon: [name], MD;  Location: [facility];  Service: General;  Laterality: Right;  Right  venous catheter no longer needed.  . Cystoscopy w/ ureteral stent placement  01/13/2011    Procedure: CYSTOSCOPY WITH RETROGRADE PYELOGRAM/URETERAL STENT PLACEMENT;  Surgeon: [name];  Location: [facility];  Service: Urology;  Laterality: Left;  intra-operative cystogram  . Tonsillectomy  yrs ago  . Colon surgery  08/07/09    Low Anterior Rescection, resection of L ext iliac artery,  ureterolysis  . Fracture surgery  1950'S    RIGHT ARM SURGERY FOR FX  . Examination under anesthesia  06/19/2011    Procedure: EXAM UNDER ANESTHESIA;  Surgeon: [name], MD;  Location: [facility];  Service: General;;  . Cystoscopy w/ ureteral stent placement  06/19/2011    Procedure: CYSTOSCOPY WITH RETROGRADE PYELOGRAM/URETERAL STENT PLACEMENT;  Surgeon: [name], MD;  Location: [facility];  Service: Urology;  Laterality: Bilateral;  Cystoscopy/Bilateral Double J Stent insertion , flexible cystoscopy   . Cystoscopy w/ ureteral stent placement  10/24/2011    Procedure: CYSTOSCOPY WITH STENT REPLACEMENT;  Surgeon: [name], MD;  Location: [facility];  Service: Urology;  Laterality: Bilateral;  45 mins requested for this case   . Cystoscopy w/ retrogrades  10/24/2011    Procedure: CYSTOSCOPY WITH RETROGRADE PYELOGRAM;  Surgeon: [name], MD;  Location: [facility];  Service: Urology;  Laterality: Left;  . Transanal resection of rectal polyp and sigmoid colectomy  03/2004  . Left hydrocele surgery  03/2009  . Cystoscopy with ureteral stent, left  06/2009    Allergies: No  Known Allergies  Medications: No prescriptions prior to admission     Social History: History   Social History  . Marital Status: Married    Spouse Name: N/A    Number of Children: N/A  . Years of Education: N/A   Occupational History  . Not on file.   Social History Main Topics  . Smoking status: Former Smoker -- 1.00 packs/day for 10 years    Quit date: 02/03/1974  . Smokeless tobacco: Never Used  . Alcohol Use: No  . Drug Use: No  . Sexually Active: Not on file   Other Topics Concern  . Not on file   Social History Narrative   Married. Lives with wife. Independent of ADLs.    Family History: Family History  Problem Relation Age of Onset  . Stroke Father 68  . Cancer Father     colon  . Lupus Daughter     Review of Systems: Positive: he has a rather low body weight, but this has been stable. He has occasional discharge from his rectum. Negative:  A further 10 point review of  systems was negative except what is listed in the HPI.  Physical Exam: @VITALS2 @ General: No acute distress.  Awake. Head:  Normocephalic.  Atraumatic. ENT:  EOMI.  Mucous membranes moist Neck:  Supple.  No lymphadenopathy. CV:  S1 present. S2 present. Regular rate. Pulmonary: Equal effort bilaterally.  Clear to auscultation bilaterally. Abdomen: Soft.  Non-tender to palpation. Well-healed surgical scars are present. Ileostomy is                          present and functioning.Nephrostomy tubes are present and patent bilaterally. Skin:  Normal turgor.  No visible rash. Extremity: No gross deformity of bilateral upper extremities.  No gross deformity of    bilateral lower extremities. Neurologic: Alert. Appropriate mood.  Penis:  circumcised.  No lesions. Urethra: Foley catheter in place.  Orthotopic meatus. Scrotum: No lesions.  No ecchymosis.  No erythema. Testicles: Descended bilaterally.  No masses bilaterally. Epididymis: Palpable bilaterally. Non Tender to  palpation.  Studies:  No results found for this basename: HGB, WBC, PLT,  in the last 72 hours  No results found for this basename: NA, K, CL, CO2, BUN, CREATININE, CALCIUM, MAGNESIUM, GFRNONAA, GFRAA,  in the last 72 hours   No results found for this basename: PT, INR, APTT,  in the last 72 hours   No components found with this basename: ABG,     Assessment:  Bilateral hydronephrosis with indwelling stents and nephrostomy tubes in place. The patient desires stent change  Plan: Cystoscopy, double-J stent exchange

## 2012-04-09 NOTE — Op Note (Signed)
Preoperative diagnosis: Bilateral hydronephrosis    Postoperative diagnosis: Same   Procedure: Cystoscopy, bilateral double-J stent exchange (7 [place] by 24 cm contour, without tether)    Surgeon: Bertram Millard. Tidus Upchurch, M.D.   Anesthesia: Gen.   Complications: None  Specimen(s): None  Drain(s): See above  Indications: 72 year-old male with bilateral ureteral strictures, the right ureter being obstructed at the ureterovesical junction, the left with a mid ureteral stricture. The patient has a complicated medical and surgical history. He currently has nephrostomy tubes in place, due to prior obstruction from his stents. He still gets a fair amount of urine out of his bladder with the catheter. The patient will eventually need a probable cystectomy as treatment of a rectourethral fistula. He presents at this time for double-J stent exchange. He would like to eventually either plugged or get rid of his nephrostomy tubes.    Technique and findings: The patient was properly identified in the holding area. He received preoperative IV Cipro. He was taken to the operating room where general anesthetic was administered with the LMA. He was placed in the dorsolithotomy position. Genitalia and perineum were prepped and draped following removal of his coud catheter. Timeout was then performed.  A 22 French cystoscope was in passed under direct vision through his urethra. There was no evident obstruction or lesion. Within the prostatic urethra there was a patent fistula posteriorly. The scope was then guided into the bladder past the nonobstructive prostate. The bladder was entered and inspected circumferentially. There was general erythema, but no lesions were seen. Both ureteral stents were seen. The right ureteral stent was grasped with the graspers, brought out through the urethral meatus. I then fed a 0.038 inch sensor-tip guidewire through the stent, and fluoroscopically positioned in the right renal  pelvis. Over top of the guidewire, I then placed a 7 French, 24 cm contour stent. It actually passed up the ureter quite easily.  The same procedure was done with the left ureteral stent, and adequate positioning was seen fluoroscopically following extraction and exchange of the 7 French, 24 cm contour stent.  Following adequate positioning bilaterally, I then removed the cystoscope and placed an 61 French coud-tip catheter. Passage of irrigant out of the catheter confirmed proper position within the bladder. The balloon was filled with 10 cc of water. It was hooked to dependent drainage.  The patient tolerated procedure well. He was awakened and then taken to the PACU in stable condition.  I have instructed his wife to plug his nephrostomy tubes on Monday. We will followup with a creatinine later in the week.

## 2012-04-09 NOTE — Anesthesia Procedure Notes (Signed)
Procedure Name: LMA Insertion Date/Time: 04/09/2012 11:53 AM Performed by: Renella Cunas D Pre-anesthesia Checklist: Patient identified, Emergency Drugs available, Suction available and Patient being monitored Patient Re-evaluated:Patient Re-evaluated prior to inductionOxygen Delivery Method: Circle System Utilized Preoxygenation: Pre-oxygenation with 100% oxygen Intubation Type: IV induction Ventilation: Mask ventilation without difficulty LMA: LMA inserted LMA Size: 4.0 Number of attempts: 1 Airway Equipment and Method: bite block Placement Confirmation: positive ETCO2 Tube secured with: Tape Dental Injury: Teeth and Oropharynx as per pre-operative assessment

## 2012-04-09 NOTE — Anesthesia Postprocedure Evaluation (Signed)
  Anesthesia Post-op Note  Patient: Eric Munoz  Procedure(s) Performed: Procedure(s) (LRB): CYSTOSCOPY WITH bilateral  STENT REPLACEMENT (Bilateral)  Patient Location: PACU  Anesthesia Type: General  Level of Consciousness: awake and alert   Airway and Oxygen Therapy: Patient Spontanous Breathing  Post-op Pain: mild  Post-op Assessment: Post-op Vital signs reviewed, Patient's Cardiovascular Status Stable, Respiratory Function Stable, Patent Airway and No signs of Nausea or vomiting  Last Vitals:  Filed Vitals:   04/09/12 1058  BP: 117/73  Pulse: 112  Temp: 36.7 C  Resp: 16    Post-op Vital Signs: stable   Complications: No apparent anesthesia complications

## 2012-04-09 NOTE — Anesthesia Preprocedure Evaluation (Signed)
Anesthesia Evaluation  Patient identified by MRN, date of birth, ID band Patient awake    Reviewed: Allergy & Precautions, H&P , NPO status , Patient's Chart, lab work & pertinent test results  Airway Mallampati: III TM Distance: <3 FB Neck ROM: Limited    Dental no notable dental hx.    Pulmonary neg pulmonary ROS,  breath sounds clear to auscultation  Pulmonary exam normal       Cardiovascular negative cardio ROS  Rhythm:Regular Rate:Normal     Neuro/Psych negative neurological ROS  negative psych ROS   GI/Hepatic negative GI ROS, Neg liver ROS,   Endo/Other  negative endocrine ROS  Renal/GU negative Renal ROS  negative genitourinary   Musculoskeletal negative musculoskeletal ROS (+)   Abdominal   Peds negative pediatric ROS (+)  Hematology negative hematology ROS (+)   Anesthesia Other Findings   Reproductive/Obstetrics negative OB ROS                           Anesthesia Physical Anesthesia Plan  ASA: III  Anesthesia Plan: General   Post-op Pain Management:    Induction: Intravenous  Airway Management Planned: LMA  Additional Equipment:   Intra-op Plan:   Post-operative Plan:   Informed Consent: I have reviewed the patients History and Physical, chart, labs and discussed the procedure including the risks, benefits and alternatives for the proposed anesthesia with the patient or authorized representative who has indicated his/her understanding and acceptance.   Dental advisory given  Plan Discussed with: CRNA and Surgeon  Anesthesia Plan Comments:         Anesthesia Quick Evaluation

## 2012-04-12 ENCOUNTER — Encounter (HOSPITAL_BASED_OUTPATIENT_CLINIC_OR_DEPARTMENT_OTHER): Payer: Self-pay | Admitting: Urology

## 2012-05-26 ENCOUNTER — Telehealth (HOSPITAL_COMMUNITY): Payer: Self-pay | Admitting: Radiology

## 2012-05-26 ENCOUNTER — Telehealth: Payer: Self-pay | Admitting: Oncology

## 2012-05-26 NOTE — Telephone Encounter (Signed)
Patient called to cancel appointment for 05-31-12 to exchange pcn.  Patient states that pcn was removed and there is no need for an exchange.

## 2012-05-26 NOTE — Telephone Encounter (Signed)
Pt called to schedule 52month f/u appt. Per pt when he left in January everyone was gone and he never heard anything re appt and he keep forgetting to call. Checked scheduling pool and no pof found. Checked chart and per 1/31 pof attached to chart pt was to have lb/fu in 52months. Pt given next available appt for lb/fu 5/29 @ 2:30pm.

## 2012-05-31 ENCOUNTER — Inpatient Hospital Stay (HOSPITAL_COMMUNITY): Admission: RE | Admit: 2012-05-31 | Payer: Medicare Other | Source: Ambulatory Visit

## 2012-06-11 ENCOUNTER — Other Ambulatory Visit: Payer: Self-pay

## 2012-06-11 ENCOUNTER — Encounter (HOSPITAL_COMMUNITY): Payer: Self-pay | Admitting: Emergency Medicine

## 2012-06-11 ENCOUNTER — Emergency Department (HOSPITAL_COMMUNITY)
Admission: EM | Admit: 2012-06-11 | Discharge: 2012-06-11 | Disposition: A | Discharge status: Home / Self Care | Payer: BC Managed Care – PPO | Attending: Emergency Medicine | Admitting: Emergency Medicine

## 2012-06-11 DIAGNOSIS — Z860100 Personal history of colon polyps, unspecified: Secondary | ICD-10-CM | POA: Insufficient documentation

## 2012-06-11 DIAGNOSIS — Z85038 Personal history of other malignant neoplasm of large intestine: Secondary | ICD-10-CM | POA: Insufficient documentation

## 2012-06-11 DIAGNOSIS — Z923 Personal history of irradiation: Secondary | ICD-10-CM | POA: Insufficient documentation

## 2012-06-11 DIAGNOSIS — N183 Chronic kidney disease, stage 3 unspecified: Secondary | ICD-10-CM | POA: Insufficient documentation

## 2012-06-11 DIAGNOSIS — Z9889 Other specified postprocedural states: Secondary | ICD-10-CM | POA: Insufficient documentation

## 2012-06-11 DIAGNOSIS — N1330 Unspecified hydronephrosis: Secondary | ICD-10-CM | POA: Insufficient documentation

## 2012-06-11 DIAGNOSIS — Z936 Other artificial openings of urinary tract status: Secondary | ICD-10-CM | POA: Insufficient documentation

## 2012-06-11 DIAGNOSIS — Z79899 Other long term (current) drug therapy: Secondary | ICD-10-CM | POA: Insufficient documentation

## 2012-06-11 DIAGNOSIS — N179 Acute kidney failure, unspecified: Secondary | ICD-10-CM

## 2012-06-11 DIAGNOSIS — Z87891 Personal history of nicotine dependence: Secondary | ICD-10-CM | POA: Insufficient documentation

## 2012-06-11 LAB — CBC WITH DIFFERENTIAL/PLATELET
Basophils Absolute: 0 10*3/uL (ref 0.0–0.1)
Eosinophils Absolute: 0.1 10*3/uL (ref 0.0–0.7)
Lymphocytes Relative: 11 % — ABNORMAL LOW (ref 12–46)
Lymphs Abs: 0.7 10*3/uL (ref 0.7–4.0)
MCH: 30.4 pg (ref 26.0–34.0)
Neutrophils Relative %: 84 % — ABNORMAL HIGH (ref 43–77)
Platelets: 447 10*3/uL — ABNORMAL HIGH (ref 150–400)
RBC: 3.45 MIL/uL — ABNORMAL LOW (ref 4.22–5.81)
RDW: 14.1 % (ref 11.5–15.5)
WBC: 6.3 10*3/uL (ref 4.0–10.5)

## 2012-06-11 LAB — COMPREHENSIVE METABOLIC PANEL
ALT: 33 U/L (ref 0–53)
AST: 15 U/L (ref 0–37)
Alkaline Phosphatase: 114 U/L (ref 39–117)
Calcium: 10 mg/dL (ref 8.4–10.5)
GFR calc Af Amer: 9 mL/min — ABNORMAL LOW (ref >90)
Glucose, Bld: 80 mg/dL (ref 70–99)
Potassium: 5.3 mEq/L — ABNORMAL HIGH (ref 3.5–5.1)
Sodium: 140 mEq/L (ref 135–145)
Total Protein: 7.6 g/dL (ref 6.0–8.3)

## 2012-06-11 LAB — URINALYSIS, ROUTINE W REFLEX MICROSCOPIC
Bilirubin Urine: NEGATIVE
Glucose, UA: NEGATIVE mg/dL
Ketones, ur: NEGATIVE mg/dL
Specific Gravity, Urine: 1.015 (ref 1.005–1.030)
pH: 6 (ref 5.0–8.0)

## 2012-06-11 LAB — URINE MICROSCOPIC-ADD ON

## 2012-06-11 MED ORDER — ALBUTEROL SULFATE (5 MG/ML) 0.5% IN NEBU
5.0000 mg | INHALATION_SOLUTION | Freq: Once | RESPIRATORY_TRACT | Status: AC
  Administered 2012-06-11: 5 mg via RESPIRATORY_TRACT
  Filled 2012-06-11: qty 1

## 2012-06-11 MED ORDER — SODIUM CHLORIDE 0.9 % IV BOLUS (SEPSIS)
1000.0000 mL | Freq: Once | INTRAVENOUS | Status: AC
  Administered 2012-06-11: 1000 mL via INTRAVENOUS

## 2012-06-11 NOTE — ED Notes (Signed)
RT notified of neb order.

## 2012-06-11 NOTE — ED Provider Notes (Signed)
History     CSN: [id number]  Arrival date & time 06/11/12  1615   First MD Initiated Contact with Patient 06/11/12 1629      Chief Complaint  Patient presents with  . Illegal value: [    elevated CRT    (Consider location/radiation/quality/duration/timing/severity/associated sxs/prior treatment) HPI Patient presents at the behest of his urologist today to abnormal lab values. The patient has a notable history of colon cancer, now status post therapy with subsequent ileostomy. He also has chronic Foley catheter, which has been present for greater than one year. The patient had bilateral ureteral stents placed in the distant past, as well as bilateral nephrostomy tubes.  His nephrostomy tubes were removed one month ago. Today, on a scheduled evaluation he was found have elevated creatinine, BUN. The patient self denies active complaints, is laughing prior to my initial evaluation. He specifically denies any fever, chills, nausea, vomiting, dysuria. He also denies abdominal pain, flank pain. He continues to have output through his Foley catheter, and ileostomy.   Past Medical History  Diagnosis Date  . Easy bruising   . 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 S/P chemotherapy, time since greater than 12 weeks 03-2010 last chemo  . S/P radiation > 12 weeks 02-2010    last radiation  . Urinoma, s/p bladder repair 01/20/2011  . History of acute renal failure nov 2012  . Hydronephrosis, bilateral     Status post surgical intervention  . 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 Colovesical fistula   . Renal insufficiency   . S/P ileostomy   . Foley catheter in place   . Difficult intubation NOTED ASA IV W/ MAY 2013 AT [facility] OR--  INTUBATED OVER BOUGIE  BY DR FORTUNE    PT DID OK W/ LMA AT [facility] 10-24-2011  -- DR ROSE  .  Nephrostomy status     CURRENTLY HAS BILATERAL TUBES  . Chronic kidney disease (CKD), stage III (moderate)       NEPHROLOGIST,  DR COLADONATO  . Normocytic anemia     Past Surgical History  Procedure Laterality Date  . Portacath placement      09-17-09 right chest/ states removed 12-20-10  . Colostomy takedown  12/20/2010    Procedure: LAPAROSCOPIC COLOSTOMY TAKEDOWN;  Surgeon: [name], MD;  Location: [facility];  Service: General;  Laterality: N/A;  Laparoscopy Colostomy Takedownn With Ileostomy  . Port-a-cath removal  12/20/2010    Procedure: REMOVAL PORT-A-CATH;  Surgeon: [name], MD;  Location: [facility];  Service: General;  Laterality: Right;  Right  venous catheter no longer needed.  . Cystoscopy w/ ureteral stent placement  01/13/2011    Procedure: CYSTOSCOPY WITH RETROGRADE PYELOGRAM/URETERAL STENT PLACEMENT;  Surgeon: [name];  Location: [facility];  Service: Urology;  Laterality: Left;  intra-operative cystogram  . Tonsillectomy  yrs ago  . Colon surgery  08/07/09    Low Anterior Rescection, resection of L ext iliac artery,  ureterolysis  . Fracture surgery  1950'S    RIGHT ARM SURGERY FOR FX  . Examination under anesthesia  06/19/2011    Procedure: EXAM UNDER ANESTHESIA;  Surgeon: [name], MD;  Location: [facility];  Service: General;;  . Cystoscopy w/ ureteral stent placement  06/19/2011    Procedure: CYSTOSCOPY WITH RETROGRADE PYELOGRAM/URETERAL STENT PLACEMENT;  Surgeon:  [name], MD;  Location: [facility];  Service: Urology;  Laterality: Bilateral;  Cystoscopy/Bilateral Double J Stent insertion , flexible cystoscopy   . Cystoscopy w/ ureteral stent placement  10/24/2011    Procedure: CYSTOSCOPY WITH STENT REPLACEMENT;  Surgeon: [name], MD;  Location: [facility];  Service: Urology;  Laterality: Bilateral;  45 mins requested for this case   . Cystoscopy w/ retrogrades  10/24/2011    Procedure: CYSTOSCOPY WITH RETROGRADE PYELOGRAM;   Surgeon: [name], MD;  Location: [facility];  Service: Urology;  Laterality: Left;  . Transanal resection of rectal polyp and sigmoid colectomy  03/2004  . Left hydrocele surgery  03/2009  . Cystoscopy with ureteral stent, left  06/2009  . Cystoscopy w/ ureteral stent placement Bilateral 04/09/2012    Procedure: CYSTOSCOPY WITH bilateral  STENT REPLACEMENT;  Surgeon: [name], MD;  Location: [facility];  Service: Urology;  Laterality: Bilateral;    Family History  Problem Relation Age of Onset  . Stroke Father 2  . Cancer Father     colon  . Lupus Daughter     History  Substance Use Topics  . Smoking status: Former Smoker -- 1.00 packs/day for 10 years    Quit date: 02/03/1974  . Smokeless tobacco: Never Used  . Alcohol Use: No      Review of Systems  All other systems reviewed and are negative.    Allergies  Review of patient's allergies indicates no known allergies.  Home Medications   Current Outpatient Rx  Name  Route  Sig  Dispense  Refill  . ciprofloxacin (CIPRO) 250 MG tablet   Oral   Take 1 tablet (250 mg total) by mouth 2 (two) times daily.   6 tablet   0   . Ferumoxytol (FERAHEME IV)   Intravenous   Inject into the vein.         Marland Kitchen lactose free nutrition (BOOST PLUS) LIQD   Oral   Take 237 mLs by mouth at bedtime.         . Multiple Vitamin (MULTIVITAMIN WITH MINERALS) TABS   Oral   Take 1 tablet by mouth daily.         . nutritional supplement (BOOST HIGH PROTEIN) LIQD   Oral   Take 1 Can by mouth daily with breakfast. Per patient taking supplement that is in plastic bottle. Was not an option in database.         . Sodium Citrate Dihydrate (CITRA PH PO)   Oral   Take 5 mLs by mouth daily.            BP 117/71  Pulse 91  Temp(Src) 98.2 F (36.8 C) (Oral)  Resp 18  SpO2 98%  Physical Exam  Nursing note and vitals reviewed. Constitutional: He is oriented to person, place, and  time. He appears well-developed. No distress.  HENT:  Head: Normocephalic and atraumatic.  Eyes: Conjunctivae and EOM are normal.  Cardiovascular: Normal rate and regular rhythm.   Pulmonary/Chest: Effort normal. No stridor. No respiratory distress.  Abdominal: He exhibits no distension. There is no tenderness.    Musculoskeletal: He exhibits no edema.  Neurological: He is alert and oriented to person, place, and time.  Skin: Skin is warm and dry. He is not diaphoretic.  Psychiatric: He has a normal mood and affect.    ED Course  Procedures (including critical care time)  Labs Reviewed  CBC WITH DIFFERENTIAL  COMPREHENSIVE METABOLIC PANEL  URINALYSIS, ROUTINE W REFLEX MICROSCOPIC   No results found.   No diagnosis found.  After the initial encounter I consulted the urology team. The urologist on call spoke with the patient's urologist regarding the referral here for further evaluation. Evaluation for left leg abnormalities, other evidence of distress was the concern, prompting emergency department evaluation.   Update: Patient appears calm, in no distress.   Update: Labs demonstrate hyperkalemia, elevated creatinine, elevated BUN.   Date: 06/11/2012  Rate: 78  Rhythm: normal sinus rhythm  QRS Axis: normal  Intervals: normal  ST/T Wave abnormalities: nonspecific ST changes  Conduction Disutrbances:none  Narrative Interpretation:   Old EKG Reviewed: unchanged ABNORMAL - no sig T waves changes  After return of labs, ECG, I again discussed the patient's case with our urology team.  The patient is already noted to both urology and interventional radiology, and placement of bilateral nephrostomy tubes is in process. With provision of albuterol for hyperkalemia, the patient was thought to be appropriate for discharge per urology, with close followup, in 2 days for repeat blood draw, and with placement of new nephrostomy tubes.   I subsequently discussed urology thoughts  with the patient and his wife.  The patient remains in no distress, laughing, smiling. They are agreeable with discharge, stating that this is their preference.    MDM  This patient presents with concern for worsening renal failure, progressive ureteral stenosis. On my exam he is awake and alert, clearly in no distress.  After discussing his care with urology, the patient was d/c w close f/u, return precautions, and dietary instructions w anticipated placement of nephrostomy tubes in the near future.      Gerhard Munch, MD 06/11/12 [phone]

## 2012-06-11 NOTE — ED Notes (Signed)
Patient with h/o nephrostomy tubes presents with elevated CRT and BUN.  Urologist sent for replacement of tubes.

## 2012-06-14 ENCOUNTER — Other Ambulatory Visit: Payer: Self-pay | Admitting: Urology

## 2012-06-14 ENCOUNTER — Other Ambulatory Visit: Payer: Self-pay | Admitting: Radiology

## 2012-06-14 DIAGNOSIS — N1330 Unspecified hydronephrosis: Secondary | ICD-10-CM

## 2012-06-15 ENCOUNTER — Ambulatory Visit (HOSPITAL_COMMUNITY)
Admission: RE | Admit: 2012-06-15 | Discharge: 2012-06-15 | Disposition: A | Discharge status: Home / Self Care | Payer: BC Managed Care – PPO | Source: Ambulatory Visit | Attending: Urology | Admitting: Urology

## 2012-06-15 ENCOUNTER — Other Ambulatory Visit: Payer: Self-pay | Admitting: Urology

## 2012-06-15 ENCOUNTER — Other Ambulatory Visit: Payer: Self-pay | Admitting: Oncology

## 2012-06-15 ENCOUNTER — Encounter (HOSPITAL_COMMUNITY): Payer: Self-pay

## 2012-06-15 VITALS — BP 114/60 | HR 68 | Temp 97.6°F | Resp 16 | Ht 69.0 in | Wt 140.0 lb

## 2012-06-15 DIAGNOSIS — N321 Vesicointestinal fistula: Secondary | ICD-10-CM | POA: Diagnosis not present

## 2012-06-15 DIAGNOSIS — N1330 Unspecified hydronephrosis: Secondary | ICD-10-CM

## 2012-06-15 DIAGNOSIS — C189 Malignant neoplasm of colon, unspecified: Secondary | ICD-10-CM | POA: Diagnosis not present

## 2012-06-15 DIAGNOSIS — N135 Crossing vessel and stricture of ureter without hydronephrosis: Secondary | ICD-10-CM | POA: Diagnosis present

## 2012-06-15 DIAGNOSIS — N183 Chronic kidney disease, stage 3 unspecified: Secondary | ICD-10-CM | POA: Insufficient documentation

## 2012-06-15 LAB — CBC WITH DIFFERENTIAL/PLATELET
Basophils Absolute: 0 10*3/uL (ref 0.0–0.1)
HCT: 32.3 % — ABNORMAL LOW (ref 39.0–52.0)
Hemoglobin: 11 g/dL — ABNORMAL LOW (ref 13.0–17.0)
Lymphocytes Relative: 7 % — ABNORMAL LOW (ref 12–46)
Lymphs Abs: 0.6 10*3/uL — ABNORMAL LOW (ref 0.7–4.0)
MCV: 87.8 fL (ref 78.0–100.0)
Monocytes Absolute: 0.6 10*3/uL (ref 0.1–1.0)
Monocytes Relative: 7 % (ref 3–12)
Neutro Abs: 7.3 10*3/uL (ref 1.7–7.7)
RBC: 3.68 MIL/uL — ABNORMAL LOW (ref 4.22–5.81)
WBC: 8.7 10*3/uL (ref 4.0–10.5)

## 2012-06-15 LAB — APTT: aPTT: 36 seconds (ref 24–37)

## 2012-06-15 LAB — URINE CULTURE

## 2012-06-15 LAB — BASIC METABOLIC PANEL
CO2: 12 mEq/L — ABNORMAL LOW (ref 19–32)
Chloride: 113 mEq/L — ABNORMAL HIGH (ref 96–112)
GFR calc Af Amer: 14 mL/min — ABNORMAL LOW (ref >90)
Potassium: 5 mEq/L (ref 3.5–5.1)

## 2012-06-15 MED ORDER — SODIUM CHLORIDE 0.9 % IV SOLN
INTRAVENOUS | Status: DC
  Administered 2012-06-15: 20 mL/h via INTRAVENOUS

## 2012-06-15 MED ORDER — LIDOCAINE HCL 1 % IJ SOLN
INTRAMUSCULAR | Status: AC
  Filled 2012-06-15: qty 40

## 2012-06-15 MED ORDER — CIPROFLOXACIN IN D5W 400 MG/200ML IV SOLN
INTRAVENOUS | Status: AC
  Filled 2012-06-15: qty 200

## 2012-06-15 MED ORDER — FENTANYL CITRATE 0.05 MG/ML IJ SOLN
INTRAMUSCULAR | Status: AC | PRN
  Administered 2012-06-15 (×2): 50 ug via INTRAVENOUS
  Administered 2012-06-15: 25 ug via INTRAVENOUS

## 2012-06-15 MED ORDER — IOHEXOL 300 MG/ML  SOLN
50.0000 mL | Freq: Once | INTRAMUSCULAR | Status: AC | PRN
  Administered 2012-06-15: 20 mL

## 2012-06-15 MED ORDER — FENTANYL CITRATE 0.05 MG/ML IJ SOLN
INTRAMUSCULAR | Status: AC
  Filled 2012-06-15: qty 6

## 2012-06-15 MED ORDER — MIDAZOLAM HCL 2 MG/2ML IJ SOLN
INTRAMUSCULAR | Status: AC | PRN
  Administered 2012-06-15: 1 mg via INTRAVENOUS
  Administered 2012-06-15: 0.5 mg via INTRAVENOUS
  Administered 2012-06-15: 1 mg via INTRAVENOUS

## 2012-06-15 MED ORDER — MIDAZOLAM HCL 2 MG/2ML IJ SOLN
INTRAMUSCULAR | Status: AC
  Filled 2012-06-15: qty 6

## 2012-06-15 MED ORDER — CIPROFLOXACIN IN D5W 400 MG/200ML IV SOLN
400.0000 mg | Freq: Once | INTRAVENOUS | Status: AC
  Administered 2012-06-15: 400 mg via INTRAVENOUS

## 2012-06-15 NOTE — H&P (Signed)
Eric Munoz is an 72 y.o. male.   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  He has history of longstanding bilateral distal ureteral occlusion, renal insufficiency with bilateral stents and bilateral nephrostomy tubes placed in recent past. The nephrostomy tubes were recently removed by urology service, however, pt soon developed worsening renal failure with rise in creatinine from 2.4 to 4.5. He presents today for replacement of bilateral nephrostomy tubes.  Past Medical History  Diagnosis Date  . Easy bruising   . 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 S/P chemotherapy, time since greater than 12 weeks 03-2010 last chemo  . S/P radiation > 12 weeks 02-2010    last radiation  . Urinoma, s/p bladder repair 01/20/2011  . History of acute renal failure nov 2012  . Hydronephrosis, bilateral     Status post surgical intervention  . 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 Colovesical fistula   . Renal insufficiency   . S/P ileostomy   . Foley catheter in place   . Difficult intubation NOTED ASA IV W/ MAY 2013 AT [facility] OR--  INTUBATED OVER BOUGIE  BY DR FORTUNE    PT DID OK W/ LMA AT [facility] 10-24-2011  -- DR ROSE  . Nephrostomy status     CURRENTLY HAS BILATERAL TUBES  . Chronic kidney disease (CKD), stage III (moderate)       NEPHROLOGIST,  DR COLADONATO  . Normocytic anemia     Past Surgical History  Procedure Laterality Date  . Portacath placement      09-17-09 right chest/ states removed 12-20-10  . Colostomy takedown  12/20/2010    Procedure: LAPAROSCOPIC COLOSTOMY TAKEDOWN;  Surgeon: [name], MD;   Location: [facility];  Service: General;  Laterality: N/A;  Laparoscopy Colostomy Takedownn With Ileostomy  . Port-a-cath removal  12/20/2010    Procedure: REMOVAL PORT-A-CATH;  Surgeon: [name], MD;  Location: [facility];  Service: General;  Laterality: Right;  Right  venous catheter no longer needed.  . Cystoscopy w/ ureteral stent placement  01/13/2011    Procedure: CYSTOSCOPY WITH RETROGRADE PYELOGRAM/URETERAL STENT PLACEMENT;  Surgeon: [name];  Location: [facility];  Service: Urology;  Laterality: Left;  intra-operative cystogram  . Tonsillectomy  yrs ago  . Colon surgery  08/07/09    Low Anterior Rescection, resection of L ext iliac artery,  ureterolysis  . Fracture surgery  1950'S    RIGHT ARM SURGERY FOR FX  . Examination under anesthesia  06/19/2011    Procedure: EXAM UNDER ANESTHESIA;  Surgeon: [name], MD;  Location: [facility];  Service: General;;  . Cystoscopy w/ ureteral stent placement  06/19/2011    Procedure: CYSTOSCOPY WITH RETROGRADE PYELOGRAM/URETERAL STENT PLACEMENT;  Surgeon: [name], MD;  Location: [facility];  Service: Urology;  Laterality: Bilateral;  Cystoscopy/Bilateral Double J Stent insertion , flexible cystoscopy   . Cystoscopy w/ ureteral stent placement  10/24/2011    Procedure: CYSTOSCOPY WITH STENT REPLACEMENT;  Surgeon: [name], MD;  Location: [facility];  Service: Urology;  Laterality: Bilateral;  45 mins requested for this case   . Cystoscopy w/  retrogrades  10/24/2011    Procedure: CYSTOSCOPY WITH RETROGRADE PYELOGRAM;  Surgeon: [name], MD;  Location: [facility];  Service: Urology;  Laterality: Left;  . Transanal resection of rectal polyp and sigmoid colectomy  03/2004  . Left hydrocele surgery  03/2009  . Cystoscopy with ureteral stent, left  06/2009  . Cystoscopy w/ ureteral stent placement Bilateral 04/09/2012    Procedure: CYSTOSCOPY WITH bilateral  STENT REPLACEMENT;  Surgeon: [name], MD;  Location: [facility];  Service: Urology;  Laterality: Bilateral;    Family History  Problem Relation Age of Onset  . Stroke Father 2  . Cancer Father     colon  . Lupus Daughter    Social History:  reports that he quit smoking about 38 years ago. He has never used smokeless tobacco. He reports that he does not drink alcohol or use illicit drugs.  Allergies: No Known Allergies  Current outpatient prescriptions:Multiple Vitamin (MULTIVITAMIN WITH MINERALS) TABS, Take 1 tablet by mouth daily., Disp: , Rfl: ;  Sodium Citrate Dihydrate (CITRA PH PO), Take 5 mLs by mouth daily. , Disp: , Rfl: ;  nutritional supplement (BOOST HIGH PROTEIN) LIQD, Take 1 Can by mouth daily with breakfast. Per patient taking supplement that is in plastic bottle. Was not an option in database., Disp: , Rfl:  Current facility-administered medications:0.9 %  sodium chloride infusion, , Intravenous, Continuous, Brayton El, PA-C;  ciprofloxacin (CIPRO) IVPB 400 mg, 400 mg, Intravenous, Once, Brayton El, PA-C   Results for orders placed during the hospital encounter of 06/15/12  CBC WITH DIFFERENTIAL      Result Value Range   WBC 8.7  4.0 - 10.5 K/uL   RBC 3.68 (*) 4.22 - 5.81 MIL/uL   Hemoglobin 11.0 (*) 13.0 - 17.0 g/dL   HCT 16.1 (*) 09.6 - 04.5 %   MCV 87.8  78.0 - 100.0 fL   MCH 29.9  26.0 - 34.0 pg   MCHC 34.1  30.0 - 36.0 g/dL   RDW 40.9  81.1 - 91.4 %   Platelets 469 (*) 150 - 400 K/uL   Neutrophils Relative 84 (*) 43 - 77 %   Neutro Abs 7.3  1.7 - 7.7 K/uL   Lymphocytes Relative 7 (*) 12 - 46 %   Lymphs Abs 0.6 (*) 0.7 - 4.0 K/uL   Monocytes Relative 7  3 - 12 %   Monocytes Absolute 0.6  0.1 - 1.0 K/uL   Eosinophils Relative 2  0 - 5 %   Eosinophils Absolute 0.2  0.0 - 0.7 K/uL   Basophils Relative 0  0 - 1 %   Basophils Absolute 0.0  0.0 - 0.1 K/uL  BMP/PT/PTT pending Review of Systems  Constitutional: Negative for fever and chills.  Respiratory: Negative  for cough and shortness of breath.   Cardiovascular: Negative for chest pain.  Gastrointestinal: Negative for nausea, vomiting and abdominal pain.  Genitourinary: Positive for flank pain.       Occ blood-tinged urine  Musculoskeletal: Positive for back pain.  Neurological: Negative for headaches.   Vitals:   BP 124/73  HR 83  R 20  TEMP 98  O2 SATS 100%RA Physical Exam  Constitutional: He is oriented to person, place, and time.  Thin WM in NAD  Cardiovascular: Normal rate and regular rhythm.   Respiratory: Effort normal and breath sounds normal.  GI: Soft. Bowel sounds are normal. There is no tenderness.  Intact ostomy bag in place  Genitourinary:  foley cath in place, draining yellow urine  Musculoskeletal: Normal range of motion. He exhibits no edema.  Neurological: He is alert and oriented to person, place, and time.     Assessment/Plan Pt with history of rectosigmoid carcinoma with prior resection, rectovesical fistula, indwelling chronic bilateral ureteral stents secondary to distal ureteral occlusions, and worsening renal failure. Plan is for placement of percutaneous bilateral nephrostomy tubes today. Details/risks of procedure d/w pt/wife with their understanding and consent.  Monik Lins,D KEVIN 06/15/2012, 9:31 AM

## 2012-06-15 NOTE — Procedures (Signed)
Successful bilateral 40fr pcns No comp Stable Keep to external drainage

## 2012-06-17 ENCOUNTER — Other Ambulatory Visit: Payer: Self-pay | Admitting: Urology

## 2012-06-17 DIAGNOSIS — N1330 Unspecified hydronephrosis: Secondary | ICD-10-CM

## 2012-06-19 ENCOUNTER — Telehealth (HOSPITAL_COMMUNITY): Payer: Self-pay | Admitting: Emergency Medicine

## 2012-06-19 NOTE — ED Notes (Signed)
Post ED Visit - Positive Culture Follow-up  Culture report reviewed by antimicrobial stewardship pharmacist: []  Wes Dulaney, Pharm.D., BCPS [x]  Celedonio Miyamoto, Pharm.D., BCPS []  Georgina Pillion, [address].D., BCPS []  [place], [place].D., BCPS, AAHIVP []  Estella Husk, Pharm.D., BCPS, AAHIV  Positive urine culture No treatment needed.  Kylie A Holland 06/19/2012, 8:49 AM

## 2012-07-01 ENCOUNTER — Ambulatory Visit (HOSPITAL_COMMUNITY)
Admission: RE | Admit: 2012-07-01 | Discharge: 2012-07-01 | Disposition: A | Discharge status: Home / Self Care | Payer: BC Managed Care – PPO | Source: Ambulatory Visit | Attending: Oncology | Admitting: Oncology

## 2012-07-01 ENCOUNTER — Other Ambulatory Visit (HOSPITAL_BASED_OUTPATIENT_CLINIC_OR_DEPARTMENT_OTHER): Payer: BC Managed Care – PPO | Admitting: Lab

## 2012-07-01 ENCOUNTER — Encounter: Payer: Self-pay | Admitting: Oncology

## 2012-07-01 ENCOUNTER — Ambulatory Visit (HOSPITAL_BASED_OUTPATIENT_CLINIC_OR_DEPARTMENT_OTHER): Payer: BC Managed Care – PPO | Admitting: Oncology

## 2012-07-01 ENCOUNTER — Telehealth: Payer: Self-pay | Admitting: Oncology

## 2012-07-01 VITALS — BP 124/69 | HR 79 | Temp 97.9°F | Resp 18 | Ht 69.0 in | Wt 141.9 lb

## 2012-07-01 DIAGNOSIS — N289 Disorder of kidney and ureter, unspecified: Secondary | ICD-10-CM

## 2012-07-01 DIAGNOSIS — J449 Chronic obstructive pulmonary disease, unspecified: Secondary | ICD-10-CM | POA: Insufficient documentation

## 2012-07-01 DIAGNOSIS — C19 Malignant neoplasm of rectosigmoid junction: Secondary | ICD-10-CM | POA: Insufficient documentation

## 2012-07-01 DIAGNOSIS — J4489 Other specified chronic obstructive pulmonary disease: Secondary | ICD-10-CM | POA: Insufficient documentation

## 2012-07-01 DIAGNOSIS — Z936 Other artificial openings of urinary tract status: Secondary | ICD-10-CM | POA: Insufficient documentation

## 2012-07-01 DIAGNOSIS — D649 Anemia, unspecified: Secondary | ICD-10-CM

## 2012-07-01 DIAGNOSIS — Z87891 Personal history of nicotine dependence: Secondary | ICD-10-CM | POA: Insufficient documentation

## 2012-07-01 DIAGNOSIS — Z85038 Personal history of other malignant neoplasm of large intestine: Secondary | ICD-10-CM

## 2012-07-01 DIAGNOSIS — M954 Acquired deformity of chest and rib: Secondary | ICD-10-CM | POA: Insufficient documentation

## 2012-07-01 LAB — COMPREHENSIVE METABOLIC PANEL (CC13)
AST: 11 U/L (ref 5–34)
Alkaline Phosphatase: 71 U/L (ref 40–150)
BUN: 45.2 mg/dL — ABNORMAL HIGH (ref 7.0–26.0)
Creatinine: 2.9 mg/dL — ABNORMAL HIGH (ref 0.7–1.3)

## 2012-07-01 LAB — CBC WITH DIFFERENTIAL/PLATELET
Basophils Absolute: 0 10*3/uL (ref 0.0–0.1)
Eosinophils Absolute: 0.3 10*3/uL (ref 0.0–0.5)
HGB: 10 g/dL — ABNORMAL LOW (ref 13.0–17.1)
MCV: 90.7 fL (ref 79.3–98.0)
NEUT#: 6 10*3/uL (ref 1.5–6.5)
RDW: 15.7 % — ABNORMAL HIGH (ref 11.0–14.6)
lymph#: 0.5 10*3/uL — ABNORMAL LOW (ref 0.9–3.3)

## 2012-07-01 NOTE — Telephone Encounter (Signed)
Gave pt appt for lab and MD on September 2014 °

## 2012-07-01 NOTE — Progress Notes (Signed)
This office note has been dictated.  #[id number]

## 2012-07-02 LAB — IRON AND TIBC
%SAT: 18 % — ABNORMAL LOW (ref 20–55)
Iron: 47 ug/dL (ref 42–165)
UIBC: 213 ug/dL (ref 125–400)

## 2012-07-02 LAB — CEA: CEA: 1.4 ng/mL (ref 0.0–5.0)

## 2012-07-02 NOTE — Progress Notes (Signed)
CC:   Eric Sportsman, MD Eric Munoz, M.D., Ph.D. Eric Munoz, M.D. Eric Munoz, M.D. Eric Munoz, M.D. Eric Munoz, M.D.  PROBLEM LIST:  1. 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 Diagnosis  was established in July of 2011. The patient underwent rather  extensive surgery on both July 5th and August 08, 2009. There were  multiple tumor nodules. Six out of 26 lymph nodes were involved  along with adjacent small bowel. There was involvement of a  periaortic lymph node. There was a close radial margin. There was  tumor thrombus in a vein. There was extracapsular extension,  lymphovascular and perineural invasion, invasion of tumor into the  left external iliac artery and left ureter. KRAS mutation was not  detected, and thus the tumor is KRAS wild type. Surgery involved a  colostomy and placement of a left ureteral stent. There was a  question of whether this represented a de novo cancer or possibly  recurrence from a previous sigmoid well-differentiated  adenocarcinoma that was resected by Eric Munoz on  03/15/2004. This tumor arose apparently in a polyp with 0/3 lymph  nodes and was felt to be T1 stage I. The margin, however, was 0.5  mm. After the patient's surgery in July 2011, he received 7 cycles of  FOLFOX from 09/26/2009 through 12/24/2009. The patient received  pelvic radiation with continuous infusion 5-fluorouracil  from 01/07/2010 through 02/15/2009. The patient then received 3  cycles of 5-FU leucovorin and 5-FU by continuous infusion from  03/04/2010 through 04/01/2010. 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 PET scan carried out on 06/16/2011  showed no evidence of recurrent or metastatic disease.  2. Ileostomy, 12/20/2010.  3. Right leg weakness secondary to femoral nerve injuries following  the patient's surgery in July 2011. Currently  resolved.  4. The patient underwent extensive surgery on December 20, 2010. This  consisted of lysis of adhesions, laparoscopic-assisted splenic  flexure mobilization, laparoscopic colostomy takedown, primary  repair of bladder injury, diverting loop ileostomy in the right  upper quadrant, and removal of the patient's Port-A-Cath.  5. Bilateral ureteral stents, most recently exchanged on 04/09/2012 by Eric Munoz.  6. 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  7. Progressive weight loss.  8. Anemia noted in January 2013 with a prior history of iron  deficiency anemia requiring intravenous Feraheme 510 mg  on 09/26/2009 and on 10/10/2009. Renal insufficiency is probably contributing to this patient's anemia at the present time.  9. Development of renal insufficiency noted in May 2013 with acute decompensation noted on 11/04/2011.  10. Bilateral percutaneous nephrostomies carried out on January 10, 2012 for bilateral hydronephrosis.  These tubes were removed in April 2014 but needed to be reinserted on 06/15/2012 because of renal decompensation.     MEDICATIONS:  Reviewed and recorded. Current Outpatient Prescriptions  Medication Sig Dispense Refill  . Multiple Vitamin (MULTIVITAMIN WITH MINERALS) TABS Take 1 tablet by mouth daily.      . nutritional supplement (BOOST HIGH PROTEIN) LIQD Take 1 Can by mouth daily with breakfast. Per patient taking supplement that is in plastic bottle. Was not an option in database.      . Sodium Citrate Dihydrate (CITRA PH PO) Take 5 mLs by mouth daily.        No current facility-administered medications for this visit.    SMOKING HISTORY:  The patient smoked a pack of cigarettes a  day for about 15 years.  He stopped smoking in 1976.   HISTORY:  I saw Eric Munoz today for follow up of his adenocarcinoma of the rectosigmoid, stage IV, T4b N2a M1a with carcinoma involving periaortic lymph nodes at the time of surgery in early July 2011.   The patient's tumor is KRAS wild type.  The patient's last PET scan was on 06/16/2011 showed no evidence of recurrent cancer.  The patient was last seen by [place] on 03/05/2012.  There have been no dramatic changes in the patient's condition since that time.  On that visit, we found that he was slightly iron deficient.  His ferritin had dropped from 209 down to 32.  On 03/11/2012 we went ahead and gave him IV Feraheme 510 mg IV.  He tolerated this well without any problems.  On 04/09/2012 the patient underwent a cysto and exchange of his bilateral double-J ureteral stents.  The patient apparently was doing well in April and it was decided to remove the nephrostomy tubes.  On February 9th, however the BUN had risen to 104 and the creatinine up to 6.67 from the patient's recent baseline on 03/05/2012 with a BUN of 49 and a creatinine of 2.3.  On 06/15/2012, the patient had bilateral nephrostomies reinserted by Interventional Radiology.  His BUN and creatinine have returned to baseline.  The patient has a functioning ileostomy.  He also has an indwelling Foley catheter.  He estimates a total urine output from all 3 sources which he wears bags or about 200 mL per day.  Eric Munoz saw Eric Munoz at [facility].  Apparently, Eric Munoz is recommending that the patient have cystectomy and radical urinary diversion.  I believe that Eric Munoz is going to be obtaining a second opinion at the [facility] sometime in the next several weeks.  In the meantime, Eric Munoz's clinical status seems to be stable and fairly satisfactory.  He is really without any major complaints.  He denies any pain, troubles with fatigue, anorexia, trouble breathing.  He is active around his home.  He had lost some weight, a few weeks ago but feels he is regaining his weight.  He did mention to me that in late April or early May, he had an illness for a couple weeks characterized by chills and sweats but no fever.  He  did not have a rash.  There was some concern about a tick related illness since he has picked up a couple of ticks from his yard.    PHYSICAL EXAMINATION:  The patient is somewhat thin.  He does not look particularly ill.  He is in no acute distress.  Weight is 141 pounds, 14.4 ounces.  Height 5 feet, 9 inches, body surface area 1.77 sq m. Blood pressure 124/69.  Other vital signs are normal.  He is afebrile. He wears glasses.  No scleral icterus.  MOUTH AND PHARYNX: Benign. There is no peripheral adenopathy palpable.  HEART AND LUNGS:  Normal. He no longer has a Port-A-Cath which was removed in mid November 2012. ABDOMEN:  Benign except for ileostomy bag in the right mid abdomen.  The patient does have bilateral nephrostomies.  Urine looks fairly clear. He also has a  Foley catheter, and the urine looks fairly clear there is well.  The patient may have a left inguinal hernia which does not seem to be symptomatic.  EXTREMITIES:  1+ pretibial edema.  Left greater than right.  NEUROLOGICAL EXAM: Normal.   LABORATORY DATA:  Today, white count 7.5, ANC 6.0, hemoglobin 10.0, hematocrit 29.7, platelets 262,000.  Chemistries today notable for an albumin 3.1, BUN 45, creatinine 2.9, calcium 9.3, LDH 84.  CEA and iron studies are pending.  On 03/05/2012, Ferritin was 32, iron saturation 7% and CEA was 1.3.  IMAGING STUDIES:  1. PET scan from 11/29/2010 showed no evidence for recurrent or  metastatic cancer.  2. CT scan of abdomen and pelvis without IV contrast on 01/16/2011  showed right perinephric fluid collections. One of these was  opacified, the other was not. It was felt that these collections  could be urinomas or abscesses. The patient was noted to have a  right percutaneous nephrostomy tube with bilateral internal  ureteral stents. Gas was noted within the collecting system in the  right kidney, suggesting infection.  3. CT-guided drain placement into 2 separate right perinephric  fluid  collections carried out on 01/16/2011. Fluid collections  surrounded the right kidney. These fluid collections was  successfully decompressed. 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 Fluid in the collections did not appear to be frankly  infected. Samples were sent for Gram stain and culture.  4. Cystogram carried out on 04/30/2011 showed patent colovesical  fistula arising along the right posterolateral aspect of the  bladder.  5. PET scan from 06/16/2011 showed no evidence of recurrent or  metastatic disease.  6. Renal/urinary tract ultrasound complete from 06/20/2011 showed  normal appearance of the renal parenchyma and no hydronephrosis.  The bladder was decompressed by a Foley catheter.  7. CT scan of the pelvis without IV contrast carried out on 12/21/2011  showed a Foley catheter within the distal colon with administered  contrast opacifying the colon, cecum, and appendix. Bilateral ureteral  stents were present as well as stable postoperative pelvic changes.  8. Renal/urinary tract ultrasound completed on 01/09/2012 showed moderate  bilateral hydronephrosis with bilateral ureteral stents in place. There  was a moderately enlarged prostate gland. 9. Chest x-ray, 2 view, from 07/01/2012 showed changes of mild underlying chronic obstructive pulmonary disease with no acute or focal abnormality Seen.     PROCEDURES:  1. Cystoscopy and bilateral ureteral catheter stent exchanges  carried out by Dr. [name] on 06/19/2011 and 10/24/2011.  2. Bilateral percutaneous nephrostomy tube placements with  ultrasound and fluoroscopic guidance carried out on 01/10/2012. These  procedures were carried out by interventional radiology. 3. The patient underwent cystoscopy and bilateral double-J ureteral stent exchange on 04/09/2012 by Dr. Ethlyn Daniels. 4. On 06/15/2012 patient underwent successful ultrasound and fluoroscopic directed placement of bilateral 10-French  nephrostomy by Interventional Radiology.    IMPRESSION AND PLAN:  Clinically, Mr. Waldrop seems to be doing well with no obvious evidence of metastatic disease.  His last PET scan was carried out on 06/16/2011.  Clinically, the patient is getting along reasonably well.  His situation remains complicated by renal insufficiency and a colovesical fistula.  The patient is reluctant to undergo radical surgery.  He will be seeking a second opinion at the [facility] in the next few weeks.  In the meantime, he is being managed with Foley catheter, decompression of the bladder and bilateral nephrostomies.  He also has bilateral ureteral stents as well as ileostomy.  We will go ahead and obtain a chest x-ray, PA and lateral today.  I will hold off on further  PET scans.  If the patient is to undergo radical surgery, then I would recommend that he have a PET scan to rule out  metastatic disease prior to undergoing any radical surgery.  I have asked Eric Munoz to return in 4 months, at which time we will check CBC, chemistries, iron studies and a CEA.    ______________________________ Samul Dada, M.D. DSM/MEDQ  D:  07/01/2012  T:  07/02/2012  Job:  [id number]

## 2012-07-20 ENCOUNTER — Telehealth: Payer: Self-pay | Admitting: Medical Oncology

## 2012-07-20 NOTE — Telephone Encounter (Signed)
Pt called asking if we can get his records together and make him a CD of scans. He is going to [facility] later in the month for a second opinion. I spoke with Rebecka Apley and our medical record and they can have records ready by late tomorrow. He then asked if possible if we can send the records. I asked him for a MD name and address. He will get this to me.

## 2012-07-27 ENCOUNTER — Other Ambulatory Visit: Payer: Self-pay | Admitting: Urology

## 2012-07-27 ENCOUNTER — Ambulatory Visit (HOSPITAL_COMMUNITY)
Admission: RE | Admit: 2012-07-27 | Discharge: 2012-07-27 | Disposition: A | Discharge status: Home / Self Care | Payer: BC Managed Care – PPO | Source: Ambulatory Visit | Attending: Urology | Admitting: Urology

## 2012-07-27 ENCOUNTER — Other Ambulatory Visit (HOSPITAL_COMMUNITY): Payer: Self-pay | Admitting: Interventional Radiology

## 2012-07-27 DIAGNOSIS — N1330 Unspecified hydronephrosis: Secondary | ICD-10-CM

## 2012-07-27 DIAGNOSIS — Z436 Encounter for attention to other artificial openings of urinary tract: Secondary | ICD-10-CM | POA: Insufficient documentation

## 2012-07-27 DIAGNOSIS — C19 Malignant neoplasm of rectosigmoid junction: Secondary | ICD-10-CM | POA: Insufficient documentation

## 2012-07-27 DIAGNOSIS — N2889 Other specified disorders of kidney and ureter: Secondary | ICD-10-CM | POA: Insufficient documentation

## 2012-07-27 MED ORDER — IOHEXOL 300 MG/ML  SOLN
50.0000 mL | Freq: Once | INTRAMUSCULAR | Status: AC | PRN
  Administered 2012-07-27: 15 mL

## 2012-07-27 MED ORDER — LIDOCAINE HCL 1 % IJ SOLN
INTRAMUSCULAR | Status: AC
  Filled 2012-07-27: qty 20

## 2012-07-27 NOTE — Procedures (Signed)
Interventional Radiology Procedure Note  Procedure: 1. Bilateral antegrade nephroureterograms 2. Exchange of bilateral neph tubes Complications: None Recommendations: - Maintain tubes to gravity drainage - Return to IR in 4-6 weeks for next neph tube check/change  Signed,  Sterling Big, MD Vascular & Interventional Radiologist [facility]

## 2012-09-07 ENCOUNTER — Ambulatory Visit (HOSPITAL_COMMUNITY)
Admission: RE | Admit: 2012-09-07 | Discharge: 2012-09-07 | Disposition: A | Discharge status: Home / Self Care | Payer: BC Managed Care – PPO | Source: Ambulatory Visit | Attending: Urology | Admitting: Urology

## 2012-09-07 ENCOUNTER — Other Ambulatory Visit: Payer: Self-pay | Admitting: Urology

## 2012-09-07 DIAGNOSIS — N1330 Unspecified hydronephrosis: Secondary | ICD-10-CM

## 2012-09-07 DIAGNOSIS — C189 Malignant neoplasm of colon, unspecified: Secondary | ICD-10-CM | POA: Insufficient documentation

## 2012-09-07 DIAGNOSIS — N135 Crossing vessel and stricture of ureter without hydronephrosis: Secondary | ICD-10-CM | POA: Insufficient documentation

## 2012-09-07 DIAGNOSIS — Z436 Encounter for attention to other artificial openings of urinary tract: Secondary | ICD-10-CM | POA: Insufficient documentation

## 2012-09-07 MED ORDER — IOHEXOL 300 MG/ML  SOLN
20.0000 mL | Freq: Once | INTRAMUSCULAR | Status: AC | PRN
  Administered 2012-09-07: 20 mL

## 2012-09-07 NOTE — Procedures (Signed)
Exchange bilat 21F perc neph tubes No complication No blood loss. See complete dictation in [facility].

## 2012-09-08 ENCOUNTER — Other Ambulatory Visit: Payer: Self-pay

## 2012-10-19 ENCOUNTER — Other Ambulatory Visit (HOSPITAL_COMMUNITY): Payer: BC Managed Care – PPO

## 2012-10-26 ENCOUNTER — Other Ambulatory Visit: Payer: Self-pay | Admitting: Urology

## 2012-10-26 ENCOUNTER — Ambulatory Visit (HOSPITAL_COMMUNITY)
Admission: RE | Admit: 2012-10-26 | Discharge: 2012-10-26 | Disposition: A | Discharge status: Home / Self Care | Payer: BC Managed Care – PPO | Source: Ambulatory Visit | Attending: Urology | Admitting: Urology

## 2012-10-26 DIAGNOSIS — C189 Malignant neoplasm of colon, unspecified: Secondary | ICD-10-CM | POA: Insufficient documentation

## 2012-10-26 DIAGNOSIS — N1330 Unspecified hydronephrosis: Secondary | ICD-10-CM

## 2012-10-26 DIAGNOSIS — N135 Crossing vessel and stricture of ureter without hydronephrosis: Secondary | ICD-10-CM | POA: Insufficient documentation

## 2012-10-26 DIAGNOSIS — Z436 Encounter for attention to other artificial openings of urinary tract: Secondary | ICD-10-CM | POA: Insufficient documentation

## 2012-10-26 MED ORDER — IOHEXOL 300 MG/ML  SOLN
10.0000 mL | Freq: Once | INTRAMUSCULAR | Status: AC | PRN
  Administered 2012-10-26: 10 mL

## 2012-10-26 NOTE — Procedures (Signed)
Bilat perc neph exchange No complication No blood loss. See complete dictation in [facility].

## 2012-10-29 ENCOUNTER — Other Ambulatory Visit: Payer: Self-pay | Admitting: Internal Medicine

## 2012-10-29 DIAGNOSIS — Z85038 Personal history of other malignant neoplasm of large intestine: Secondary | ICD-10-CM

## 2012-10-29 DIAGNOSIS — D649 Anemia, unspecified: Secondary | ICD-10-CM

## 2012-10-29 DIAGNOSIS — C19 Malignant neoplasm of rectosigmoid junction: Secondary | ICD-10-CM

## 2012-11-02 ENCOUNTER — Telehealth: Payer: Self-pay | Admitting: Internal Medicine

## 2012-11-02 ENCOUNTER — Ambulatory Visit (HOSPITAL_BASED_OUTPATIENT_CLINIC_OR_DEPARTMENT_OTHER): Payer: BC Managed Care – PPO | Admitting: Internal Medicine

## 2012-11-02 ENCOUNTER — Other Ambulatory Visit (HOSPITAL_BASED_OUTPATIENT_CLINIC_OR_DEPARTMENT_OTHER): Payer: BC Managed Care – PPO | Admitting: Lab

## 2012-11-02 VITALS — BP 130/63 | HR 58 | Temp 97.4°F | Resp 18 | Ht 69.0 in | Wt 152.1 lb

## 2012-11-02 DIAGNOSIS — D649 Anemia, unspecified: Secondary | ICD-10-CM

## 2012-11-02 DIAGNOSIS — D539 Nutritional anemia, unspecified: Secondary | ICD-10-CM

## 2012-11-02 DIAGNOSIS — N360 Urethral fistula: Secondary | ICD-10-CM

## 2012-11-02 DIAGNOSIS — C19 Malignant neoplasm of rectosigmoid junction: Secondary | ICD-10-CM

## 2012-11-02 DIAGNOSIS — Z86010 Personal history of colonic polyps: Secondary | ICD-10-CM

## 2012-11-02 DIAGNOSIS — Z85038 Personal history of other malignant neoplasm of large intestine: Secondary | ICD-10-CM

## 2012-11-02 DIAGNOSIS — Z932 Ileostomy status: Secondary | ICD-10-CM

## 2012-11-02 LAB — CBC WITH DIFFERENTIAL/PLATELET
Basophils Absolute: 0 10*3/uL (ref 0.0–0.1)
EOS%: 4.2 % (ref 0.0–7.0)
HGB: 11.6 g/dL — ABNORMAL LOW (ref 13.0–17.1)
MCH: 30.7 pg (ref 27.2–33.4)
MCHC: 33.3 g/dL (ref 32.0–36.0)
MCV: 92.1 fL (ref 79.3–98.0)
MONO%: 8.4 % (ref 0.0–14.0)
RBC: 3.77 10*6/uL — ABNORMAL LOW (ref 4.20–5.82)
RDW: 14.1 % (ref 11.0–14.6)

## 2012-11-02 LAB — COMPREHENSIVE METABOLIC PANEL (CC13)
AST: 13 U/L (ref 5–34)
Albumin: 3.5 g/dL (ref 3.5–5.0)
Alkaline Phosphatase: 61 U/L (ref 40–150)
BUN: 41.5 mg/dL — ABNORMAL HIGH (ref 7.0–26.0)
Potassium: 4.7 mEq/L (ref 3.5–5.1)

## 2012-11-02 LAB — IRON AND TIBC CHCC
TIBC: 306 ug/dL (ref 202–409)
UIBC: 242 ug/dL (ref 117–376)

## 2012-11-02 LAB — FERRITIN CHCC: Ferritin: 67 ng/ml (ref 22–316)

## 2012-11-02 NOTE — Telephone Encounter (Signed)
gv and printed appt sched and avs for pt for Jan 2015 °

## 2012-11-03 NOTE — Progress Notes (Signed)
[facility] OFFICE PROGRESS NOTE  Neldon Labella, MD [address] [place] [address]  DIAGNOSIS: History of colon cancer, stage IV, Stage 4, [id number] (Chem last Feb12/ Radiation-last Jan12) - Plan: CBC & Diff and Retic, Comprehensive metabolic panel, CEA  History of adenomatous polyp of rectum s/p low LAR - Plan: CBC & Diff and Retic, Comprehensive metabolic panel, CEA  Loop diverting ileostomy, 16Nov2012  Unspecified deficiency anemia  Colourethral fistula   Rectosigmoid cancer, stage IV - Plan: CBC & Diff and Retic, Comprehensive metabolic panel, CEA  Chief Complaint  Patient presents with  . Rectosigmoid cancer, stage IV    CURRENT THERAPY:  Observation.  INTERVAL HISTORY: Eric Munoz 72 y.o. 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 He was last seen by Dr. Arline Asp on 07/01/2012. His last PET scan was on 06/16/2011 which showed no evidence of cancer. On 03/11/2012, he received feraheme 510 mg IV x1 do to a ferritin drop to 32. Today he is without complaints. He reports being seen at the [facility] clinic in July 2014 by Dr. Abagail Kitchens who concurred with the surgeon's opinion, Dr. Vonita Moss of [facility]  (as discussed previously). Dr. Vonita Moss recommended that he have a cystectomy and a radical urinary diversion. Yesterday he started talking with a surgeon at [facility] clinic regarding further management of his colo-vesicle fistula and he is having his medical records forwarded. He reports continued weight gain with 10 pounds over the last 6 months. Continues to have his nephrostomy tubes changed every 6 weeks. Denies any recent hospitalizations or emergency room visits. He denies any fevers or chills or acute shortness of breath. He also denies trouble with fatigue, anorexia, dyspnea.  MEDICAL HISTORY: Past Medical  History  Diagnosis Date  . Easy bruising   . 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 S/P chemotherapy, time since greater than 12 weeks 03-2010 last chemo  . S/P radiation > 12 weeks 02-2010    last radiation  . Urinoma, s/p bladder repair 01/20/2011  . History of acute renal failure nov 2012  . Hydronephrosis, bilateral     Status post surgical intervention  . Colon cancer 2007, recurrent Jul2011    Recurrent, Stage 4, [id number] (Left colectomy/LAR , ChemoTx completed Feb2012/ XRT completed Jan2012  . Colovesical fistula   . Renal insufficiency   . S/P ileostomy   . Foley catheter in place   . Difficult intubation NOTED ASA IV W/ MAY 2013 AT [facility] MAIN OR--  INTUBATED OVER BOUGIE  BY DR FORTUNE    PT DID OK W/ LMA AT [facility] 10-24-2011  -- DR ROSE  . Nephrostomy status     CURRENTLY HAS BILATERAL TUBES  . Chronic kidney disease (CKD), stage III (moderate)       NEPHROLOGIST,  DR COLADONATO  . Normocytic anemia     INTERIM HISTORY: has History of adenomatous polyp of rectum s/p low LAR; History of colon cancer, stage IV, Stage 4, [id number] (Chem last Feb12/ Radiation-last Jan12); Ureteral stricture, left; Loop diverting ileostomy, 16Nov2012; Colourethral fistula ; Renal insufficiency; Unspecified deficiency anemia; Tachycardia; UTI (lower urinary tract infection); Acute renal failure in the setting of indwelling ureteral stents and bilateral hydronephrosis; Hyperkalemia; Metabolic acidosis; Normocytic anemia; Rectosigmoid cancer, stage IV; Pyonephrosis; and Hypokalemia on his problem list.  ALLERGIES:  has No Known Allergies.  MEDICATIONS: has a current medication list which includes the following prescription(s): multivitamin with minerals and feeding supplement.  SURGICAL HISTORY:  Past Surgical History  Procedure Laterality Date  . Portacath placement      09-17-09 right chest/ states removed 12-20-10  . Colostomy takedown   12/20/2010    Procedure: LAPAROSCOPIC COLOSTOMY TAKEDOWN;  Surgeon: [name], MD;  Location: [facility];  Service: General;  Laterality: N/A;  Laparoscopy Colostomy Takedownn With Ileostomy  . Port-a-cath removal  12/20/2010    Procedure: REMOVAL PORT-A-CATH;  Surgeon: [name], MD;  Location: [facility];  Service: General;  Laterality: Right;  Right  venous catheter no longer needed.  . Cystoscopy w/ ureteral stent placement  01/13/2011    Procedure: CYSTOSCOPY WITH RETROGRADE PYELOGRAM/URETERAL STENT PLACEMENT;  Surgeon: [name];  Location: [facility];  Service: Urology;  Laterality: Left;  intra-operative cystogram  . Tonsillectomy  yrs ago  . Colon surgery  08/07/09    Low Anterior Rescection, resection of L ext iliac artery,  ureterolysis  . Fracture surgery  1950'S    RIGHT ARM SURGERY FOR FX  . Examination under anesthesia  06/19/2011    Procedure: EXAM UNDER ANESTHESIA;  Surgeon: [name], MD;  Location: [facility];  Service: General;;  . Cystoscopy w/ ureteral stent placement  06/19/2011    Procedure: CYSTOSCOPY WITH RETROGRADE PYELOGRAM/URETERAL STENT PLACEMENT;  Surgeon: [name], MD;  Location: [facility];  Service: Urology;  Laterality: Bilateral;  Cystoscopy/Bilateral Double J Stent insertion , flexible cystoscopy   . Cystoscopy w/ ureteral stent placement  10/24/2011    Procedure: CYSTOSCOPY WITH STENT REPLACEMENT;  Surgeon: [name], MD;  Location: [facility];  Service: Urology;  Laterality: Bilateral;  45 mins requested for this case   . Cystoscopy w/ retrogrades  10/24/2011    Procedure: CYSTOSCOPY WITH RETROGRADE PYELOGRAM;  Surgeon: [name], MD;  Location: [facility];  Service: Urology;  Laterality: Left;  . Transanal resection of rectal polyp and sigmoid colectomy  03/2004  . Left hydrocele surgery  03/2009  . Cystoscopy with ureteral stent, left  06/2009  . Cystoscopy w/ ureteral stent placement Bilateral  04/09/2012    Procedure: CYSTOSCOPY WITH bilateral  STENT REPLACEMENT;  Surgeon: [name], MD;  Location: [facility];  Service: Urology;  Laterality: Bilateral;   PROBLEM LIST: 1. 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 Diagnosis  was established in July of 2011. The patient underwent rather  extensive surgery on both July 5th and August 08, 2009. There were  multiple tumor nodules. Six out of 26 lymph nodes were involved  along with adjacent small bowel. There was involvement of a  periaortic lymph node. There was a close radial margin. There was  tumor thrombus in a vein. There was extracapsular extension,  lymphovascular and perineural invasion, invasion of tumor into the  left external iliac artery and left ureter. KRAS mutation was not  detected, and thus the tumor is KRAS wild type. Surgery involved a  colostomy and placement of a left ureteral stent. There was a  question of whether this represented a de novo cancer or possibly  recurrence from a previous sigmoid well-differentiated  adenocarcinoma that was resected by Dr. [name] on  03/15/2004. This tumor arose apparently in a polyp with 0/3 lymph  nodes and was felt to be T1 stage I. The  margin, however, was 0.5  mm. After the patient's surgery in July 2011, he received 7 cycles of  FOLFOX from 09/26/2009 through 12/24/2009. The patient received  pelvic radiation with continuous infusion 5-fluorouracil  from 01/07/2010 through 02/15/2009. The patient then received 3  cycles of 5-FU leucovorin and 5-FU by continuous infusion from  03/04/2010 through 04/01/2010. 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 PET scan carried out on 06/16/2011  showed no evidence of recurrent or metastatic disease.  2. Ileostomy, 12/20/2010.  3. Right leg weakness secondary to femoral nerve injuries following  the  patient's surgery in July 2011. Currently resolved.  4. The patient underwent extensive surgery on December 20, 2010. This  consisted of lysis of adhesions, laparoscopic-assisted splenic  flexure mobilization, laparoscopic colostomy takedown, primary  repair of bladder injury, diverting loop ileostomy in the right  upper quadrant, and removal of the patient's Port-A-Cath.  5. Bilateral ureteral stents, most recently exchanged on 04/09/2012 by Dr.  [name].  6. 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  7. Progressive weight loss.  8. Anemia noted in January 2013 with a prior history of iron  deficiency anemia requiring intravenous Feraheme 510 mg  on 09/26/2009 and on 10/10/2009. Renal insufficiency is probably contributing to this patient's anemia at the present time.  9. Development of renal insufficiency noted in May 2013 with acute decompensation noted on 11/04/2011.  10. Bilateral percutaneous nephrostomies carried out on January 10, 2012 for  bilateral hydronephrosis. These tubes were removed in April 2014 but  needed to be reinserted on 06/15/2012 because of renal decompensation.   REVIEW OF SYSTEMS:   Constitutional: Denies fevers, chills or abnormal weight loss Eyes: Denies blurriness of vision Ears, nose, mouth, throat, and face: Denies mucositis or sore throat Respiratory: Denies cough, dyspnea or wheezes Cardiovascular: Denies palpitation, chest discomfort or lower extremity swelling Gastrointestinal:  Denies nausea, heartburn or change in bowel habits Skin: Denies abnormal skin rashes Lymphatics: Denies new lymphadenopathy or easy bruising Neurological:Denies numbness, tingling or new weaknesses Behavioral/Psych: Mood is stable, no new changes  All other systems were reviewed with the patient and are negative.  PHYSICAL EXAMINATION: ECOG PERFORMANCE STATUS: 0 - Asymptomatic  Blood pressure 130/63, pulse 58, temperature 97.4 F (36.3 C), temperature source  Oral, resp. rate 18, height 5\' 9"  (1.753 m), weight 152 lb 1.6 oz (68.992 kg), SpO2 100.00%.  GENERAL:alert, no distress and comfortable thin middle-aged male SKIN: skin color, texture, turgor are normal, no rashes or significant lesions EYES: normal, Conjunctiva are pink and non-injected, sclera clear OROPHARYNX:no exudate, no erythema and lips, buccal mucosa, and tongue normal  NECK: supple, thyroid normal size, non-tender, without nodularity LYMPH:  no palpable lymphadenopathy in the cervical, axillary or supraclavicular LUNGS: clear to auscultation and percussion with normal breathing effort HEART: regular rate & rhythm and no murmurs and one plus pretibial edema (left greater than right) ABDOMEN:abdomen soft, non-tender and normal bowel sounds; ileostomy bag in right mid abdomen; he does also have bilateral nephrostomy tubes in place. Also has a Foley catheter with urine that is clear. Musculoskeletal:no cyanosis of digits and no clubbing  NEURO: alert & oriented x 3 with fluent speech, no focal motor/sensory deficits   LABORATORY DATA: Results for orders placed in visit on 11/02/12 (from the past 48 hour(s))  CBC WITH DIFFERENTIAL     Status: Abnormal   Collection Time    11/02/12 11:02 AM      Result Value Range   WBC 4.8  4.0 - 10.3  10e3/uL   NEUT# 3.7  1.5 - 6.5 10e3/uL   HGB 11.6 (*) 13.0 - 17.1 g/dL   HCT 84.1 (*) 32.4 - 40.1 %   Platelets 261  140 - 400 10e3/uL   MCV 92.1  79.3 - 98.0 fL   MCH 30.7  27.2 - 33.4 pg   MCHC 33.3  32.0 - 36.0 g/dL   RBC 0.27 (*) 2.53 - 6.64 10e6/uL   RDW 14.1  11.0 - 14.6 %   lymph# 0.4 (*) 0.9 - 3.3 10e3/uL   MONO# 0.4  0.1 - 0.9 10e3/uL   Eosinophils Absolute 0.2  0.0 - 0.5 10e3/uL   Basophils Absolute 0.0  0.0 - 0.1 10e3/uL   NEUT% 78.6 (*) 39.0 - 75.0 %   LYMPH% 8.3 (*) 14.0 - 49.0 %   MONO% 8.4  0.0 - 14.0 %   EOS% 4.2  0.0 - 7.0 %   BASO% 0.5  0.0 - 2.0 %  IRON AND TIBC [facility]     Status: None   Collection Time    11/02/12 11:02  AM      Result Value Range   Iron 64  42 - 163 ug/dL   TIBC 403  474 - 259 ug/dL   UIBC 563  875 - 643 ug/dL   %SAT 21  20 - 55 %  FERRITIN [facility]     Status: None   Collection Time    11/02/12 11:02 AM      Result Value Range   Ferritin 67  22 - 316 ng/ml  COMPREHENSIVE METABOLIC PANEL ([facility]13)     Status: Abnormal   Collection Time    11/02/12 11:03 AM      Result Value Range   Sodium 142  136 - 145 mEq/L   Potassium 4.7  3.5 - 5.1 mEq/L   Chloride 113 (*) 98 - 109 mEq/L   CO2 21 (*) 22 - 29 mEq/L   Glucose 92  70 - 140 mg/dl   BUN 32.9 (*) 7.0 - 51.8 mg/dL   Creatinine 2.6 (*) 0.7 - 1.3 mg/dL   Total Bilirubin 8.41  0.20 - 1.20 mg/dL   Alkaline Phosphatase 61  40 - 150 U/L   AST 13  5 - 34 U/L   ALT 13  0 - 55 U/L   Total Protein 6.9  6.4 - 8.3 g/dL   Albumin 3.5  3.5 - 5.0 g/dL   Calcium 9.6  8.4 - 66.0 mg/dL  CEA     Status: None   Collection Time    11/02/12 11:03 AM      Result Value Range   CEA 1.2  0.0 - 5.0 ng/mL       Labs:  Lab Results  Component Value Date   WBC 4.8 11/02/2012   HGB 11.6* 11/02/2012   HCT 34.7* 11/02/2012   MCV 92.1 11/02/2012   PLT 261 11/02/2012   NEUTROABS 3.7 11/02/2012      Chemistry      Component Value Date/Time   NA 142 11/02/2012 1103   NA 141 06/15/2012 0925   K 4.7 11/02/2012 1103   K 5.0 06/15/2012 0925   CL 113* 07/01/2012 1427   CL 113* 06/15/2012 0925   CO2 21* 11/02/2012 1103   CO2 12* 06/15/2012 0925   BUN 41.5* 11/02/2012 1103   BUN 90* 06/15/2012 0925   CREATININE 2.6* 11/02/2012 1103   CREATININE 4.49* 06/15/2012 0925   CREATININE 2.04* 01/08/2011 1540  Component Value Date/Time   CALCIUM 9.6 11/02/2012 1103   CALCIUM 9.4 06/15/2012 0925   ALKPHOS 61 11/02/2012 1103   ALKPHOS 114 06/11/2012 1725   AST 13 11/02/2012 1103   AST 15 06/11/2012 1725   ALT 13 11/02/2012 1103   ALT 33 06/11/2012 1725   BILITOT 0.30 11/02/2012 1103   BILITOT 0.2* 06/11/2012 1725       Recent Labs  11/02/12 1102  FERRITIN 67  TIBC 306  IRON  64    RADIOGRAPHIC STUDIES: Ir Nephrostomy Tube Change  10/26/2012   CLINICAL DATA:  colon carcinoma, ileal conduit, ureteral obstruction despite bilateral ureteral stents, requiring long-term percutaneous nephrostomy drainage.  EXAM: BILATERAL PERCUTANEOUS NEPHROSTOMY CATHETER EXCHANGE UNDER FLUOROSCOPY  TECHNIQUE: The procedure, risks (including but not limited to bleeding, infection, organ damage ), benefits, and alternatives were explained to the patient. Questions regarding the procedure were encouraged and answered. The patient understands and consents to the procedure. The nephrostomy tube and surrounding skin were prepped with Betadine, draped in usual sterile fashion.  A small amount of contrast was injected through the right nephrostomy catheter to opacify the renal collecting system. The catheter was cut and exchanged over a 0.035" angiographic wire for a new 10-French pigtail catheter, formed centrally within the collecting system under fluoroscopy. Contrast injection confirms appropriate positioning.  In a similar fashion, the left nephrostomy catheter was injected, cut, and exchanged for a new 10-French pigtail catheter, formed centrally within the left renal collecting system. Injection confirms appropriate positioning and patency. Both catheters were secured externally with 0 Prolene sutures. The patient tolerated the procedure well, with no immediate complication.  FLUOROSCOPY TIME:  1 min 18 seconds  IMPRESSION: 1. Technically successful exchange of bilateral nephrostomy catheters under fluoroscopy   Electronically Signed   By: Oley Balm M.D.   On: 10/26/2012 17:11   Ir Nephrostomy Tube Change  10/26/2012   CLINICAL DATA:  colon carcinoma, ileal conduit, ureteral obstruction despite bilateral ureteral stents, requiring long-term percutaneous nephrostomy drainage.  EXAM: BILATERAL PERCUTANEOUS NEPHROSTOMY CATHETER EXCHANGE UNDER FLUOROSCOPY  TECHNIQUE: The procedure, risks (including  but not limited to bleeding, infection, organ damage ), benefits, and alternatives were explained to the patient. Questions regarding the procedure were encouraged and answered. The patient understands and consents to the procedure. The nephrostomy tube and surrounding skin were prepped with Betadine, draped in usual sterile fashion.  A small amount of contrast was injected through the right nephrostomy catheter to opacify the renal collecting system. The catheter was cut and exchanged over a 0.035" angiographic wire for a new 10-French pigtail catheter, formed centrally within the collecting system under fluoroscopy. Contrast injection confirms appropriate positioning.  In a similar fashion, the left nephrostomy catheter was injected, cut, and exchanged for a new 10-French pigtail catheter, formed centrally within the left renal collecting system. Injection confirms appropriate positioning and patency. Both catheters were secured externally with 0 Prolene sutures. The patient tolerated the procedure well, with no immediate complication.  FLUOROSCOPY TIME:  1 min 18 seconds  IMPRESSION: 1. Technically successful exchange of bilateral nephrostomy catheters under fluoroscopy   Electronically Signed   By: Oley Balm M.D.   On: 10/26/2012 17:11   RADIOLOGY REPORT*  (07/01/2012) This image was personally reviewed by me with the patient. Clinical Data: Colon cancer. No chest complaints. Previous smoker  CHEST - 2 VIEW  Comparison: None.  Findings: Mild hyperinflation with flattening of hemidiaphragms  suggest some degree of underlying COPD. A mild pectus deformity is  noted. Taking these findings into consideration, heart and  mediastinal contours are within normal limits. The lung fields  appear clear with no signs of focal infiltrate or congestive  failure. No pleural fluid or significant peribronchial cuffing is  seen. No parenchymal nodularity is identified to suggest pulmonary  metastatic disease.   Bony structures appear intact. The top of the patient's indwelling  nephrostomy tubes are seen on the lateral view.  IMPRESSION:  Changes of mild underlying COPD. No acute or focal abnormality  seen  ASSESSMENT: Eric Munoz 72 y.o. male with a history of History of colon cancer, stage IV, Stage 4, [id number] (Chem last Feb12/ Radiation-last Jan12) - Plan: CBC & Diff and Retic, Comprehensive metabolic panel, CEA  History of adenomatous polyp of rectum s/p low LAR - Plan: CBC & Diff and Retic, Comprehensive metabolic panel, CEA  Loop diverting ileostomy, 16Nov2012  Unspecified deficiency anemia  Colourethral fistula   Rectosigmoid cancer, stage IV - Plan: CBC & Diff and Retic, Comprehensive metabolic panel, CEA   PLAN:  --Mr. Godek continues to do well without evidence of recurrent, mestastatic disease.  We reviewed his laboratories and scans including his PET scan done on 06/16/2011. He also review records concerning his colovesical fistula. He has sought out for a second opinion from [facility] clinic with concurrence of the opinion  of Dr. Vonita Moss from [facility] concerning a cystectomy and a radical urinary diversion. However, the patient has been reluctant to undergo radical surgery. He is now requesting an opinion from the specialist who deals with colovesical fistula from the [place] clinic. Our office will coordinate and facilitate transfer of his medical records recommend a PET CT to exclude metastatic disease if surgery is pursued.  We reviewed the chest x-ray obtained on his last visit that showed no evidence of acute disease. It did show changes of mild underlying COPD.  --Patient has bilateral nephrostomy tubes in place, bilateral urethral stents, ileostomy.  He will continue with bilateral nephrostomy tube exchanges every 6 weeks. Will continue to be managed with a Foley catheter, decompression of the bladder and bilateral nephrostomies.  --Patient instructed to return to clinic in 4  months at which time we will check his complete blood count, chemistries, iron studies, and a CEA.  --Regarding his iron Deficiency anemia, hemoglobin is 11.6 today and he is without symptoms of anemia, i.e.fatigue.  We will check the iron studies as noted above.  All questions were answered. The patient knows to call the clinic with any problems, questions or concerns. We can certainly see the patient much sooner if necessary.  I spent 15 minutes counseling the patient face to face. The total time spent in the appointment was 25 minutes.    Jenniah Bhavsar, MD 11/03/2012 9:28 AM

## 2012-11-25 ENCOUNTER — Other Ambulatory Visit: Payer: Self-pay | Admitting: Urology

## 2012-12-06 ENCOUNTER — Encounter (HOSPITAL_BASED_OUTPATIENT_CLINIC_OR_DEPARTMENT_OTHER): Payer: Self-pay | Admitting: *Deleted

## 2012-12-06 ENCOUNTER — Other Ambulatory Visit: Payer: Self-pay | Admitting: Radiology

## 2012-12-07 ENCOUNTER — Other Ambulatory Visit: Payer: Self-pay | Admitting: Urology

## 2012-12-07 ENCOUNTER — Encounter (HOSPITAL_BASED_OUTPATIENT_CLINIC_OR_DEPARTMENT_OTHER): Payer: Self-pay | Admitting: *Deleted

## 2012-12-07 ENCOUNTER — Ambulatory Visit (HOSPITAL_COMMUNITY)
Admission: RE | Admit: 2012-12-07 | Discharge: 2012-12-07 | Disposition: A | Discharge status: Home / Self Care | Payer: BC Managed Care – PPO | Source: Ambulatory Visit | Attending: Urology | Admitting: Urology

## 2012-12-07 DIAGNOSIS — N1330 Unspecified hydronephrosis: Secondary | ICD-10-CM | POA: Insufficient documentation

## 2012-12-07 DIAGNOSIS — Z436 Encounter for attention to other artificial openings of urinary tract: Secondary | ICD-10-CM | POA: Insufficient documentation

## 2012-12-07 MED ORDER — LIDOCAINE-EPINEPHRINE (PF) 2 %-1:200000 IJ SOLN
INTRAMUSCULAR | Status: AC
  Filled 2012-12-07: qty 20

## 2012-12-07 MED ORDER — IOHEXOL 300 MG/ML  SOLN
20.0000 mL | Freq: Once | INTRAMUSCULAR | Status: AC | PRN
  Administered 2012-12-07: 20 mL

## 2012-12-07 NOTE — Progress Notes (Signed)
NPO AFTER MN. ARRIVE AT 0800. NEEDS HG.  

## 2012-12-07 NOTE — Procedures (Signed)
Successful bilateral 10 Fr PCN exchange.  No immediate complications.  

## 2012-12-07 NOTE — Progress Notes (Signed)
NPO AFTER MN. ARRIVE AT 080O.

## 2012-12-09 ENCOUNTER — Other Ambulatory Visit: Payer: Self-pay

## 2012-12-13 ENCOUNTER — Encounter (HOSPITAL_BASED_OUTPATIENT_CLINIC_OR_DEPARTMENT_OTHER)
Admission: RE | Disposition: A | Discharge status: Home / Self Care | Payer: Self-pay | Source: Ambulatory Visit | Attending: Urology

## 2012-12-13 ENCOUNTER — Ambulatory Visit (HOSPITAL_COMMUNITY): Payer: BC Managed Care – PPO

## 2012-12-13 ENCOUNTER — Ambulatory Visit (HOSPITAL_BASED_OUTPATIENT_CLINIC_OR_DEPARTMENT_OTHER)
Admission: RE | Admit: 2012-12-13 | Discharge: 2012-12-13 | Disposition: A | Discharge status: Home / Self Care | Payer: BC Managed Care – PPO | Source: Ambulatory Visit | Attending: Urology | Admitting: Urology

## 2012-12-13 ENCOUNTER — Ambulatory Visit (HOSPITAL_BASED_OUTPATIENT_CLINIC_OR_DEPARTMENT_OTHER): Payer: BC Managed Care – PPO | Admitting: Anesthesiology

## 2012-12-13 ENCOUNTER — Encounter (HOSPITAL_BASED_OUTPATIENT_CLINIC_OR_DEPARTMENT_OTHER): Payer: BC Managed Care – PPO | Admitting: Anesthesiology

## 2012-12-13 ENCOUNTER — Encounter (HOSPITAL_BASED_OUTPATIENT_CLINIC_OR_DEPARTMENT_OTHER): Payer: Self-pay | Admitting: *Deleted

## 2012-12-13 DIAGNOSIS — C19 Malignant neoplasm of rectosigmoid junction: Secondary | ICD-10-CM | POA: Insufficient documentation

## 2012-12-13 DIAGNOSIS — N183 Chronic kidney disease, stage 3 unspecified: Secondary | ICD-10-CM | POA: Insufficient documentation

## 2012-12-13 DIAGNOSIS — N360 Urethral fistula: Secondary | ICD-10-CM | POA: Insufficient documentation

## 2012-12-13 DIAGNOSIS — Z936 Other artificial openings of urinary tract status: Secondary | ICD-10-CM | POA: Insufficient documentation

## 2012-12-13 DIAGNOSIS — Z466 Encounter for fitting and adjustment of urinary device: Secondary | ICD-10-CM | POA: Insufficient documentation

## 2012-12-13 DIAGNOSIS — N135 Crossing vessel and stricture of ureter without hydronephrosis: Secondary | ICD-10-CM | POA: Insufficient documentation

## 2012-12-13 DIAGNOSIS — Z87891 Personal history of nicotine dependence: Secondary | ICD-10-CM | POA: Insufficient documentation

## 2012-12-13 HISTORY — DX: Chronic kidney disease, unspecified: N17.9

## 2012-12-13 HISTORY — DX: Malignant neoplasm of rectosigmoid junction: C19

## 2012-12-13 HISTORY — DX: Personal history of other malignant neoplasm of large intestine: Z85.038

## 2012-12-13 HISTORY — PX: CYSTOSCOPY W/ URETERAL STENT PLACEMENT: SHX1429

## 2012-12-13 HISTORY — DX: Chronic kidney disease, unspecified: N18.9

## 2012-12-13 SURGERY — CYSTOSCOPY, FLEXIBLE, WITH STENT REPLACEMENT
Anesthesia: General | Site: Ureter | Laterality: Bilateral | Wound class: Clean Contaminated

## 2012-12-13 MED ORDER — LACTATED RINGERS IV SOLN
INTRAVENOUS | Status: DC
  Administered 2012-12-13: 09:00:00 via INTRAVENOUS
  Filled 2012-12-13: qty 1000

## 2012-12-13 MED ORDER — ONDANSETRON HCL 4 MG/2ML IJ SOLN
INTRAMUSCULAR | Status: DC | PRN
  Administered 2012-12-13: 4 mg via INTRAVENOUS

## 2012-12-13 MED ORDER — FENTANYL CITRATE 0.05 MG/ML IJ SOLN
INTRAMUSCULAR | Status: DC | PRN
  Administered 2012-12-13 (×4): 25 ug via INTRAVENOUS

## 2012-12-13 MED ORDER — FENTANYL CITRATE 0.05 MG/ML IJ SOLN
25.0000 ug | INTRAMUSCULAR | Status: DC | PRN
  Filled 2012-12-13: qty 1

## 2012-12-13 MED ORDER — PROMETHAZINE HCL 25 MG/ML IJ SOLN
6.2500 mg | INTRAMUSCULAR | Status: DC | PRN
  Filled 2012-12-13: qty 1

## 2012-12-13 MED ORDER — LIDOCAINE HCL (CARDIAC) 20 MG/ML IV SOLN
INTRAVENOUS | Status: DC | PRN
  Administered 2012-12-13: 60 mg via INTRAVENOUS

## 2012-12-13 MED ORDER — CEFAZOLIN SODIUM 1-5 GM-% IV SOLN
1.0000 g | INTRAVENOUS | Status: DC
  Filled 2012-12-13: qty 50

## 2012-12-13 MED ORDER — CEFAZOLIN SODIUM-DEXTROSE 2-3 GM-% IV SOLR
2.0000 g | INTRAVENOUS | Status: DC
  Filled 2012-12-13: qty 50

## 2012-12-13 MED ORDER — LACTATED RINGERS IV SOLN
INTRAVENOUS | Status: DC | PRN
  Administered 2012-12-13: 09:00:00 via INTRAVENOUS

## 2012-12-13 MED ORDER — STERILE WATER FOR IRRIGATION IR SOLN
Status: DC | PRN
  Administered 2012-12-13: 3000 mL

## 2012-12-13 MED ORDER — PROPOFOL 10 MG/ML IV BOLUS
INTRAVENOUS | Status: DC | PRN
  Administered 2012-12-13: 50 mg via INTRAVENOUS
  Administered 2012-12-13: 150 mg via INTRAVENOUS

## 2012-12-13 SURGICAL SUPPLY — 22 items
ADAPTER CATH URET PLST 4-6FR (CATHETERS) IMPLANT
ADPR CATH URET STRL DISP 4-6FR (CATHETERS)
BAG DRAIN URO-CYSTO SKYTR STRL (DRAIN) ×2 IMPLANT
BAG DRN UROCATH (DRAIN) ×1
CANISTER SUCT LVC 12 LTR MEDI- (MISCELLANEOUS) ×1 IMPLANT
CATH COUDE FOLEY 2W 5CC 18FR (CATHETERS) ×1 IMPLANT
CATH INTERMIT  6FR 70CM (CATHETERS) IMPLANT
CLOTH BEACON ORANGE TIMEOUT ST (SAFETY) ×2 IMPLANT
DRAPE CAMERA CLOSED 9X96 (DRAPES) ×2 IMPLANT
GLOVE BIO SURGEON STRL SZ8 (GLOVE) ×2 IMPLANT
GLOVE BIOGEL PI IND STRL 7.5 (GLOVE) IMPLANT
GLOVE BIOGEL PI INDICATOR 7.5 (GLOVE) ×2
GOWN PREVENTION PLUS LG XLONG (DISPOSABLE) ×1 IMPLANT
GOWN STRL REIN XL XLG (GOWN DISPOSABLE) ×3 IMPLANT
GUIDEWIRE 0.038 PTFE COATED (WIRE) IMPLANT
GUIDEWIRE ANG ZIPWIRE 038X150 (WIRE) IMPLANT
GUIDEWIRE STR DUAL SENSOR (WIRE) ×1 IMPLANT
NS IRRIG 500ML POUR BTL (IV SOLUTION) IMPLANT
PACK CYSTOSCOPY (CUSTOM PROCEDURE TRAY) ×2 IMPLANT
STENT CONTOUR 7FRX24 (STENTS) ×2 IMPLANT
SYRINGE 10CC LL (SYRINGE) ×1 IMPLANT
WATER STERILE IRR 3000ML UROMA (IV SOLUTION) ×1 IMPLANT

## 2012-12-13 NOTE — Anesthesia Procedure Notes (Signed)
Procedure Name: LMA Insertion Date/Time: 12/13/2012 9:35 AM Performed by: Jessica Priest Pre-anesthesia Checklist: Patient identified, Emergency Drugs available, Suction available and Patient being monitored Patient Re-evaluated:Patient Re-evaluated prior to inductionOxygen Delivery Method: Circle System Utilized Preoxygenation: Pre-oxygenation with 100% oxygen Intubation Type: IV induction Ventilation: Mask ventilation without difficulty LMA: LMA with gastric port inserted LMA Size: 5.0 Number of attempts: 1 Placement Confirmation: positive ETCO2 Tube secured with: Tape Dental Injury: Teeth and Oropharynx as per pre-operative assessment

## 2012-12-13 NOTE — Anesthesia Preprocedure Evaluation (Signed)
Anesthesia Evaluation  Patient identified by MRN, date of birth, ID band Patient awake    Reviewed: Allergy & Precautions, H&P , NPO status , Patient's Chart, lab work & pertinent test results  History of Anesthesia Complications (+) DIFFICULT AIRWAY  Airway Mallampati: IV TM Distance: <3 FB Neck ROM: Full    Dental no notable dental hx.    Pulmonary neg pulmonary ROS,  breath sounds clear to auscultation  Pulmonary exam normal       Cardiovascular negative cardio ROS  Rhythm:Regular Rate:Normal     Neuro/Psych negative neurological ROS  negative psych ROS   GI/Hepatic Neg liver ROS, Colon cancer 2007, recurrent [id number]   History of colon cancer, stage IV, Stage 4, [id number] (Chem last Feb12/ Radiation-last Jan12   Endo/Other  negative endocrine ROS  Renal/GU Renal InsufficiencyRenal disease  negative genitourinary   Musculoskeletal negative musculoskeletal ROS (+)   Abdominal   Peds negative pediatric ROS (+)  Hematology negative hematology ROS (+)   Anesthesia Other Findings   Reproductive/Obstetrics negative OB ROS                           Anesthesia Physical Anesthesia Plan  ASA: III  Anesthesia Plan: General   Post-op Pain Management:    Induction: Intravenous  Airway Management Planned: LMA  Additional Equipment:   Intra-op Plan:   Post-operative Plan:   Informed Consent: I have reviewed the patients History and Physical, chart, labs and discussed the procedure including the risks, benefits and alternatives for the proposed anesthesia with the patient or authorized representative who has indicated his/her understanding and acceptance.   Dental advisory given  Plan Discussed with: CRNA and Surgeon  Anesthesia Plan Comments:         Anesthesia Quick Evaluation

## 2012-12-13 NOTE — OR Nursing (Signed)
Foley catheter removed at 938 a.m.Marland Kitchen

## 2012-12-13 NOTE — H&P (Signed)
Urology History and Physical Exam  CC: Blocked kidneys  HPI: 72 year old male presents for bilateral J2 stent exchanges. His histary is as foloows:  He had left ureterolysis on 08/07/2009. This was for a large colon cancer that was resected by Dr. Michaell Cowing. He has had a stent in since that time.  The patient underwent attempted colostomy takedown and reanastomosis. Patient did have a low rectal anastomosis, but at the time of this procedure, there was a small bladder laceration. He also had new onset right hydronephrosis.  The hydronephrosis was treated with a stent that was placed antegrade. 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 That has been treated with Foley catheter drainage. In February, 2013, he underwent cystogram both by conventional means and with CT that revealed no evidence leakage from his bladder. This catheter was removed, but he had passage of urine through the rectum within a day.   On 06/19/2011, he had cystoscopy, double-J stent exchange, and an aborted fistula repair. Following rectal dilatation, he had his fistula open up significantly. Both Dr. Michaell Cowing and I felt that, because of this further fistula repair would best be performed by physicians more adept/experienced.  He has seen Dr. Gershon Mussel as well as a general surgeon at [facility]. He will be scheduled for possible fistula repair/graft interposition in the near future. He does think that he has a urinary tract infection.   He continues with J2 stent changes in addition to bilateral perc tubes, as he does not want to proceed with recommended cystectomy and diversion, and wants to retain his stents.    PMH: Past Medical History  Diagnosis Date  . Urinoma, s/p bladder repair   . History of acute renal failure nov 2012  . Colovesical fistula   . S/P ileostomy     12-20-2010  . Foley catheter in place   . Difficult intubation NOTED ASA IV W/ MAY 2013 AT [facility] MAIN OR--  INTUBATED OVER  BOUGIE  BY DR FORTUNE    PT DID OK W/ LMA AT [facility] 10-24-2011  -- DR ROSE  . Nephrostomy status     CURRENTLY HAS BILATERAL TUBES  . History of colon cancer, stage IV     2007 w/ recurrence july 2011--  RECTOSIGMOID [id number] s/p left coloectomy/ lar--  chemo complete feb 2012;  rxt complete jan 2012----   . Iron deficiency anemia   . Rectosigmoid cancer     stage iv--  no recurrent mestastatic disease--  oncologist--  dr Onalee Hua chism ([facility])  . Hydronephrosis, bilateral     Status post surgical intervention  W/ URETERAL STENTS  . Chronic kidney disease (CKD), stage III (moderate) SECONDARY UNCLEAR ETIOLOGY, POSSIBLE RELATED RADIATION THERAPY      NEPHROLOGIST,  DR Arrie Aran  . Acute-on-chronic kidney injury     SECONDARY TO OBSTRUCTIVE UROPATHY--  BILATERAL URETERAL STENTS, BILATERAL NEPHROSTOMY TUBES AND FOLEY CATHETER    PSH: Past Surgical History  Procedure Laterality Date  . Portacath placement  09-17-2009  . Colostomy takedown  12/20/2010    Procedure: LAPAROSCOPIC COLOSTOMY TAKEDOWN;  Surgeon: [name], MD;  Location: [facility];  Service: General;  Laterality: N/A;  Laparoscopy Colostomy Takedown With Ileostomy  . Port-a-cath removal  12/20/2010    Procedure: REMOVAL PORT-A-CATH;  Surgeon: [name], MD;  Location: [facility];  Service: General;  Laterality: Right;  Right  venous catheter no longer needed.  . Cystoscopy w/ ureteral stent placement  01/13/2011  Procedure: CYSTOSCOPY WITH RETROGRADE PYELOGRAM/URETERAL STENT PLACEMENT;  Surgeon: [name];  Location: [facility];  Service: Urology;  Laterality: Left;  intra-operative cystogram  . Tonsillectomy  yrs ago  . Fracture surgery  1950'S    RIGHT ARM SURGERY FOR FX  . Examination under anesthesia  06/19/2011    Procedure: EXAM UNDER ANESTHESIA;  Surgeon: [name], MD;  Location: [facility];  Service: General;;  . Cystoscopy w/ ureteral stent placement  06/19/2011    Procedure: CYSTOSCOPY WITH  RETROGRADE PYELOGRAM/URETERAL STENT PLACEMENT;  Surgeon: [name], MD;  Location: [facility];  Service: Urology;  Laterality: Bilateral;  Cystoscopy/Bilateral Double J Stent insertion , flexible cystoscopy   . Cystoscopy w/ ureteral stent placement  10/24/2011    Procedure: CYSTOSCOPY WITH STENT REPLACEMENT;  Surgeon: [name], MD;  Location: [facility];  Service: Urology;  Laterality: Bilateral;  45 mins requested for this case   . Cystoscopy w/ retrogrades  10/24/2011    Procedure: CYSTOSCOPY WITH RETROGRADE PYELOGRAM;  Surgeon: [name], MD;  Location: [facility];  Service: Urology;  Laterality: Left;  . Transanal resection of rectal polyp and sigmoid colectomy  03/15/2004  . Left hydrocele surgery  03/16/2009  . Cystoscopy w/ ureteral stent placement Bilateral 04/09/2012    Procedure: CYSTOSCOPY WITH bilateral  STENT REPLACEMENT;  Surgeon: [name], MD;  Location: [facility];  Service: Urology;  Laterality: Bilateral;  . Cysto/ left retrograde pyelogram/ left stent placement  03-01-2010  &  09-13-2010  . Multiple nephrostomy tube changes  every six weeks  . Colon surgery  08/07/09    Extensive lysis adhesions/ Re-do Low Anterior Rescection, Reconstruction left external iliac artery/ Left ureterolysis and ureter disposition    Allergies: No Known Allergies  Medications: No prescriptions prior to admission     Social History: History   Social History  . Marital Status: Married    Spouse Name: N/A    Number of Children: N/A  . Years of Education: N/A   Occupational History  . Not on file.   Social History Main Topics  . Smoking status: Former Smoker -- 1.00 packs/day for 10 years    Quit date: 02/03/1974  . Smokeless tobacco: Never Used  . Alcohol Use: No  . Drug Use: No  . Sexual Activity: Not on file   Other Topics Concern  . Not on file   Social History Narrative   Married. Lives with wife.  Independent of ADLs.    Family History: Family History  Problem Relation Age of Onset  . Stroke Father 78  . Cancer Father     colon  . Lupus Daughter     Review of Systems: Positive: N/A Negative:  A further 10 point review of systems was negative except what is listed in the HPI.  Physical Exam: @VITALS2 @ General: No acute distress.  Awake. Head:  Normocephalic.  Atraumatic. ENT:  EOMI.  Mucous membranes moist Neck:  Supple.  No lymphadenopathy. CV:  S1 present. S2 present. Regular rate. Pulmonary: Equal effort bilaterally.  Clear to auscultation bilaterally. Abdomen: Soft.  Non tender to palpation. Skin:  Normal turgor.  No visible rash. Extremity: No gross deformity of bilateral upper extremities.  No gross deformity of    bilateral lower extremities. Neurologic: Alert. Appropriate mood.    Studies:  No results found for this basename: HGB, WBC, PLT,  in the last 72 hours  No results found for this basename: NA, K, CL, CO2, BUN, CREATININE,  CALCIUM, MAGNESIUM, GFRNONAA, GFRAA,  in the last 72 hours   No results found for this basename: PT, INR, APTT,  in the last 72 hours   No components found with this basename: ABG,     Assessment:  Bilateral ureteral obstruction w/ stents in  Plan: Bilateral stent exchanges

## 2012-12-13 NOTE — Anesthesia Postprocedure Evaluation (Signed)
  Anesthesia Post-op Note  Patient: Eric Munoz  Procedure(s) Performed: Procedure(s) (LRB): CYSTOSCOPY WITH STENT REPLACEMENT (Bilateral)  Patient Location: PACU  Anesthesia Type: General  Level of Consciousness: awake and alert   Airway and Oxygen Therapy: Patient Spontanous Breathing  Post-op Pain: mild  Post-op Assessment: Post-op Vital signs reviewed, Patient's Cardiovascular Status Stable, Respiratory Function Stable, Patent Airway and No signs of Nausea or vomiting  Last Vitals:  Filed Vitals:   12/13/12 1007  BP: 127/53  Pulse: 64  Temp: 36.1 C  Resp: 12    Post-op Vital Signs: stable   Complications: No apparent anesthesia complications

## 2012-12-13 NOTE — Transfer of Care (Signed)
Immediate Anesthesia Transfer of Care Note  Patient: Eric Munoz  Procedure(s) Performed: Procedure(s) (LRB): CYSTOSCOPY WITH STENT REPLACEMENT (Bilateral)  Patient Location: PACU  Anesthesia Type: General  Level of Consciousness: awake, sedated, patient cooperative and responds to stimulation  Airway & Oxygen Therapy: Patient Spontanous Breathing and Patient connected to face mask oxygen  Post-op Assessment: Report given to PACU RN, Post -op Vital signs reviewed and stable and Patient moving all extremities  Post vital signs: Reviewed and stable  Complications: No apparent anesthesia complications

## 2012-12-13 NOTE — Op Note (Signed)
Preoperative diagnosis: Bilateral ureteral obstruction  Postoperative diagnosis: Same   Procedure: Cystoscopy, bilateral double-J stent extractions and exchanges with 7 [place] by 24 cm contour stent bilaterally    Surgeon: Bertram Millard. Hindy Perrault, M.D.   Anesthesia: Gen.   Complications: None  Specimen(s): None  Drain(s): Replaced 18 French coud catheter, bilateral 7 x 24 cm contour stent without tether is  Indications: 72 year-old male with multiple issues secondary to recurrent adenocarcinoma of the colon, now with bilateral double-J stents as well as nephrostomy tubes as treatment for bilateral ureteral obstruction. The patient does not want to do about his stents, hoping that he will one day be able to have surgical repair of his rectourethral fistula. He presents now for double-J stent exchange [place].   Technique and findings: The patient was identified in the holding area and received 2 g of Ancef intravenously. He was taken the operating room where general anesthetic was administered with the LMA. He is placed in the dorsolithotomy position. Genitalia and perineum were prepped and draped, time out was performed. I then used a cystoscope to remove each ureteral stent over top of the guidewire which was advanced into the renal pelvis bilaterally. Following removal of the stents and adequate positioning of the guidewire, I then replaced 7 [place] by 24 cm contour stent bilaterally. The bladder was inspected and found to be contracted but normal. The urethral fistula was still present. Following  Adequate posit there were, the bladder was drained, an 85 [place] coud-tip catheter was replaced and the patient was awakened and taken to the PACU in stable condition. He tolerated procedure well. ioning of both ureteral stents

## 2012-12-14 ENCOUNTER — Encounter (HOSPITAL_BASED_OUTPATIENT_CLINIC_OR_DEPARTMENT_OTHER): Payer: Self-pay | Admitting: Urology

## 2013-01-18 ENCOUNTER — Other Ambulatory Visit: Payer: Self-pay | Admitting: Urology

## 2013-01-18 ENCOUNTER — Ambulatory Visit (HOSPITAL_COMMUNITY)
Admission: RE | Admit: 2013-01-18 | Discharge: 2013-01-18 | Disposition: A | Discharge status: Home / Self Care | Payer: BC Managed Care – PPO | Source: Ambulatory Visit | Attending: Urology | Admitting: Urology

## 2013-01-18 DIAGNOSIS — N1330 Unspecified hydronephrosis: Secondary | ICD-10-CM

## 2013-01-18 DIAGNOSIS — Z436 Encounter for attention to other artificial openings of urinary tract: Secondary | ICD-10-CM | POA: Insufficient documentation

## 2013-01-18 DIAGNOSIS — N360 Urethral fistula: Secondary | ICD-10-CM | POA: Insufficient documentation

## 2013-01-18 DIAGNOSIS — N135 Crossing vessel and stricture of ureter without hydronephrosis: Secondary | ICD-10-CM | POA: Insufficient documentation

## 2013-01-18 MED ORDER — IOHEXOL 300 MG/ML  SOLN
10.0000 mL | Freq: Once | INTRAMUSCULAR | Status: AC | PRN
  Administered 2013-01-18: 1 mL

## 2013-01-18 MED ORDER — LIDOCAINE HCL 1 % IJ SOLN
INTRAMUSCULAR | Status: AC
  Filled 2013-01-18: qty 20

## 2013-01-18 NOTE — Procedures (Signed)
Successful bilateral PCN exchange.  Exchange of the left sided PCN complicated by inadvertent cranial migration of the left sided double J stent with cranial end redundantly coiled in the left renal pelvis and caudal end retracted into the distal aspect of the left ureter.  D/W Dr. [name] at the time of procedure completion.

## 2013-02-10 ENCOUNTER — Other Ambulatory Visit: Payer: Self-pay | Admitting: Urology

## 2013-02-14 ENCOUNTER — Encounter (HOSPITAL_BASED_OUTPATIENT_CLINIC_OR_DEPARTMENT_OTHER): Payer: Self-pay | Admitting: *Deleted

## 2013-02-14 NOTE — Progress Notes (Addendum)
NPO AFTER MN. ARRIVE AT 1030. NEEDS ISTAT 8. CURRENT CXR AND EKG IN EPIC AND CHART.

## 2013-02-18 ENCOUNTER — Ambulatory Visit (HOSPITAL_BASED_OUTPATIENT_CLINIC_OR_DEPARTMENT_OTHER): Payer: BC Managed Care – PPO | Admitting: Anesthesiology

## 2013-02-18 ENCOUNTER — Encounter (HOSPITAL_BASED_OUTPATIENT_CLINIC_OR_DEPARTMENT_OTHER): Payer: Self-pay | Admitting: *Deleted

## 2013-02-18 ENCOUNTER — Encounter (HOSPITAL_BASED_OUTPATIENT_CLINIC_OR_DEPARTMENT_OTHER): Payer: BC Managed Care – PPO | Admitting: Anesthesiology

## 2013-02-18 ENCOUNTER — Encounter (HOSPITAL_BASED_OUTPATIENT_CLINIC_OR_DEPARTMENT_OTHER)
Admission: RE | Disposition: A | Discharge status: Home / Self Care | Payer: Self-pay | Source: Ambulatory Visit | Attending: Urology

## 2013-02-18 ENCOUNTER — Ambulatory Visit (HOSPITAL_COMMUNITY): Payer: BC Managed Care – PPO

## 2013-02-18 ENCOUNTER — Ambulatory Visit (HOSPITAL_BASED_OUTPATIENT_CLINIC_OR_DEPARTMENT_OTHER)
Admission: RE | Admit: 2013-02-18 | Discharge: 2013-02-18 | Disposition: A | Discharge status: Home / Self Care | Payer: BC Managed Care – PPO | Source: Ambulatory Visit | Attending: Urology | Admitting: Urology

## 2013-02-18 DIAGNOSIS — C189 Malignant neoplasm of colon, unspecified: Secondary | ICD-10-CM | POA: Insufficient documentation

## 2013-02-18 DIAGNOSIS — N183 Chronic kidney disease, stage 3 unspecified: Secondary | ICD-10-CM | POA: Diagnosis not present

## 2013-02-18 DIAGNOSIS — T85698A Other mechanical complication of other specified internal prosthetic devices, implants and grafts, initial encounter: Secondary | ICD-10-CM | POA: Diagnosis not present

## 2013-02-18 DIAGNOSIS — Y849 Medical procedure, unspecified as the cause of abnormal reaction of the patient, or of later complication, without mention of misadventure at the time of the procedure: Secondary | ICD-10-CM | POA: Insufficient documentation

## 2013-02-18 DIAGNOSIS — N321 Vesicointestinal fistula: Secondary | ICD-10-CM | POA: Insufficient documentation

## 2013-02-18 DIAGNOSIS — N135 Crossing vessel and stricture of ureter without hydronephrosis: Secondary | ICD-10-CM | POA: Diagnosis present

## 2013-02-18 DIAGNOSIS — T8389XA Other specified complication of genitourinary prosthetic devices, implants and grafts, initial encounter: Secondary | ICD-10-CM | POA: Diagnosis not present

## 2013-02-18 DIAGNOSIS — T199XXA Foreign body in genitourinary tract, part unspecified, initial encounter: Secondary | ICD-10-CM | POA: Diagnosis not present

## 2013-02-18 DIAGNOSIS — Z936 Other artificial openings of urinary tract status: Secondary | ICD-10-CM | POA: Insufficient documentation

## 2013-02-18 DIAGNOSIS — N1330 Unspecified hydronephrosis: Secondary | ICD-10-CM | POA: Insufficient documentation

## 2013-02-18 HISTORY — PX: CYSTOSCOPY WITH URETEROSCOPY AND STENT PLACEMENT: SHX6377

## 2013-02-18 LAB — POCT I-STAT, CHEM 8
BUN: 46 mg/dL — ABNORMAL HIGH (ref 6–23)
CALCIUM ION: 1.45 mmol/L — AB (ref 1.13–1.30)
CREATININE: 3.3 mg/dL — AB (ref 0.50–1.35)
Chloride: 115 mEq/L — ABNORMAL HIGH (ref 96–112)
GLUCOSE: 80 mg/dL (ref 70–99)
HCT: 29 % — ABNORMAL LOW (ref 39.0–52.0)
HEMOGLOBIN: 9.9 g/dL — AB (ref 13.0–17.0)
Potassium: 4.3 mEq/L (ref 3.7–5.3)
SODIUM: 145 meq/L (ref 137–147)
TCO2: 18 mmol/L (ref 0–100)

## 2013-02-18 SURGERY — CYSTOURETEROSCOPY, WITH STENT INSERTION
Anesthesia: General | Site: Ureter | Laterality: Bilateral

## 2013-02-18 MED ORDER — PROPOFOL 10 MG/ML IV BOLUS
INTRAVENOUS | Status: DC | PRN
  Administered 2013-02-18: 180 mg via INTRAVENOUS

## 2013-02-18 MED ORDER — MORPHINE SULFATE 2 MG/ML IJ SOLN
2.0000 mg | INTRAMUSCULAR | Status: DC | PRN
  Filled 2013-02-18: qty 1

## 2013-02-18 MED ORDER — ACETAMINOPHEN 650 MG RE SUPP
650.0000 mg | RECTAL | Status: DC | PRN
  Filled 2013-02-18: qty 1

## 2013-02-18 MED ORDER — PROMETHAZINE HCL 25 MG/ML IJ SOLN
6.2500 mg | INTRAMUSCULAR | Status: DC | PRN
  Filled 2013-02-18: qty 1

## 2013-02-18 MED ORDER — STERILE WATER FOR IRRIGATION IR SOLN
Status: DC | PRN
Start: 2013-02-18 — End: 2013-02-18
  Administered 2013-02-18: 10 mL

## 2013-02-18 MED ORDER — SODIUM CHLORIDE 0.9 % IR SOLN
Status: DC | PRN
  Administered 2013-02-18: 4000 mL

## 2013-02-18 MED ORDER — MORPHINE SULFATE 2 MG/ML IJ SOLN
1.0000 mg | INTRAMUSCULAR | Status: DC | PRN
  Filled 2013-02-18: qty 1

## 2013-02-18 MED ORDER — SODIUM CHLORIDE 0.9 % IJ SOLN
3.0000 mL | Freq: Two times a day (BID) | INTRAMUSCULAR | Status: DC
  Filled 2013-02-18: qty 3

## 2013-02-18 MED ORDER — LACTATED RINGERS IV SOLN
INTRAVENOUS | Status: DC
  Filled 2013-02-18: qty 1000

## 2013-02-18 MED ORDER — CIPROFLOXACIN HCL 250 MG PO TABS: 500.0000 mg | ORAL_TABLET | Freq: Two times a day (BID) | ORAL | Status: DC

## 2013-02-18 MED ORDER — FENTANYL CITRATE 0.05 MG/ML IJ SOLN
INTRAMUSCULAR | Status: DC | PRN
  Administered 2013-02-18: 50 ug via INTRAVENOUS
  Administered 2013-02-18 (×2): 25 ug via INTRAVENOUS

## 2013-02-18 MED ORDER — ACETAMINOPHEN 325 MG PO TABS
650.0000 mg | ORAL_TABLET | ORAL | Status: DC | PRN
  Filled 2013-02-18: qty 2

## 2013-02-18 MED ORDER — LACTATED RINGERS IV SOLN
INTRAVENOUS | Status: DC
  Administered 2013-02-18: 11:00:00 via INTRAVENOUS
  Filled 2013-02-18: qty 1000

## 2013-02-18 MED ORDER — ONDANSETRON HCL 4 MG/2ML IJ SOLN
4.0000 mg | Freq: Four times a day (QID) | INTRAMUSCULAR | Status: DC | PRN
Start: 2013-02-18 — End: 2013-02-18
  Filled 2013-02-18: qty 2

## 2013-02-18 MED ORDER — FENTANYL CITRATE 0.05 MG/ML IJ SOLN
INTRAMUSCULAR | Status: AC
  Filled 2013-02-18: qty 4

## 2013-02-18 MED ORDER — GENTAMICIN SULFATE 40 MG/ML IJ SOLN
160.0000 mg | INTRAVENOUS | Status: AC
  Administered 2013-02-18: 160 mg via INTRAVENOUS
  Filled 2013-02-18: qty 4

## 2013-02-18 MED ORDER — LIDOCAINE HCL (CARDIAC) 20 MG/ML IV SOLN
INTRAVENOUS | Status: DC | PRN
  Administered 2013-02-18: 60 mg via INTRAVENOUS

## 2013-02-18 MED ORDER — SODIUM CHLORIDE 0.9 % IV SOLN
250.0000 mL | INTRAVENOUS | Status: DC | PRN
Start: 2013-02-18 — End: 2013-02-18
  Filled 2013-02-18: qty 250

## 2013-02-18 MED ORDER — OXYCODONE HCL 5 MG PO TABS
5.0000 mg | ORAL_TABLET | ORAL | Status: DC | PRN
  Filled 2013-02-18: qty 2

## 2013-02-18 MED ORDER — SODIUM CHLORIDE 0.9 % IJ SOLN
3.0000 mL | INTRAMUSCULAR | Status: DC | PRN
  Filled 2013-02-18: qty 3

## 2013-02-18 MED ORDER — ONDANSETRON HCL 4 MG/2ML IJ SOLN
INTRAMUSCULAR | Status: DC | PRN
  Administered 2013-02-18: 4 mg via INTRAVENOUS

## 2013-02-18 SURGICAL SUPPLY — 49 items
ADAPTER CATH URET PLST 4-6FR (CATHETERS) IMPLANT
ADPR CATH URET STRL DISP 4-6FR (CATHETERS)
BAG DRAIN URO-CYSTO SKYTR STRL (DRAIN) ×3 IMPLANT
BAG DRN ANRFLXCHMBR STRAP LEK (BAG) ×1
BAG DRN UROCATH (DRAIN) ×1
BAG URINE LEG 19OZ MD ST LTX (BAG) ×2 IMPLANT
BASKET LASER NITINOL 1.9FR (BASKET) IMPLANT
BASKET STNLS GEMINI 4WIRE 3FR (BASKET) ×2 IMPLANT
BASKET ZERO TIP NITINOL 2.4FR (BASKET) IMPLANT
BRUSH URET BIOPSY 3F (UROLOGICAL SUPPLIES) IMPLANT
BSKT STON RTRVL 120 1.9FR (BASKET)
BSKT STON RTRVL GEM 120X11 3FR (BASKET) ×1
BSKT STON RTRVL ZERO TP 2.4FR (BASKET)
CANISTER SUCT LVC 12 LTR MEDI- (MISCELLANEOUS) ×2 IMPLANT
CATH COUDE FOLEY 2W 5CC 16FR (CATHETERS) ×2 IMPLANT
CATH INTERMIT  6FR 70CM (CATHETERS) ×3 IMPLANT
CATH URET 5FR 28IN CONE TIP (BALLOONS)
CATH URET 5FR 28IN OPEN ENDED (CATHETERS) IMPLANT
CATH URET 5FR 70CM CONE TIP (BALLOONS) IMPLANT
CLOTH BEACON ORANGE TIMEOUT ST (SAFETY) ×3 IMPLANT
DRAPE CAMERA CLOSED 9X96 (DRAPES) ×3 IMPLANT
ELECT REM PT RETURN 9FT ADLT (ELECTROSURGICAL)
ELECTRODE REM PT RTRN 9FT ADLT (ELECTROSURGICAL) IMPLANT
GLOVE BIO SURGEON STRL SZ8 (GLOVE) ×1 IMPLANT
GLOVE BIOGEL PI IND STRL 7.5 (GLOVE) IMPLANT
GLOVE BIOGEL PI INDICATOR 7.5 (GLOVE) ×4
GLOVE INDICATOR 7.5 STRL GRN (GLOVE) ×4 IMPLANT
GLOVE SKINSENSE NS SZ7.0 (GLOVE) ×2
GLOVE SKINSENSE STRL SZ7.0 (GLOVE) IMPLANT
GOWN PREVENTION PLUS LG XLONG (DISPOSABLE) ×1 IMPLANT
GOWN STRL REIN XL XLG (GOWN DISPOSABLE) ×1 IMPLANT
GOWN STRL REUS W/TWL LRG LVL3 (GOWN DISPOSABLE) ×2 IMPLANT
GOWN STRL REUS W/TWL XL LVL3 (GOWN DISPOSABLE) ×4 IMPLANT
GUIDEWIRE 0.038 PTFE COATED (WIRE) IMPLANT
GUIDEWIRE ANG ZIPWIRE 038X150 (WIRE) IMPLANT
GUIDEWIRE STR DUAL SENSOR (WIRE) IMPLANT
IV NS 1000ML (IV SOLUTION) ×12
IV NS 1000ML BAXH (IV SOLUTION) IMPLANT
IV NS IRRIG 3000ML ARTHROMATIC (IV SOLUTION) ×2 IMPLANT
KIT BALLIN UROMAX 15FX10 (LABEL) IMPLANT
KIT BALLN UROMAX 15FX4 (MISCELLANEOUS) IMPLANT
KIT BALLN UROMAX 26 75X4 (MISCELLANEOUS)
PACK CYSTOSCOPY (CUSTOM PROCEDURE TRAY) ×3 IMPLANT
SET HIGH PRES BAL DIL (LABEL)
SHEATH ACCESS URETERAL 38CM (SHEATH) IMPLANT
SHEATH ACCESS URETERAL 54CM (SHEATH) IMPLANT
STENT CONTOUR 7FRX24 (STENTS) ×4 IMPLANT
SYR 20CC LL (SYRINGE) ×2 IMPLANT
WATER STERILE IRR 500ML POUR (IV SOLUTION) ×2 IMPLANT

## 2013-02-18 NOTE — Discharge Instructions (Signed)
1. You may see some blood in the urine and may have some burning with urination for 48-72 hours. You also may notice that you have to urinate more frequently or urgently after your procedure which is normal.  2. You should call should you develop an inability urinate, fever > 101, persistent nausea and vomiting that prevents you from eating or drinking to stay hydrated.  3. If you have a stent, you will likely urinate more frequently and urgently until the stent is removed and you may experience some discomfort/pain in the lower abdomen and flank especially when urinating. You may take pain medication prescribed to you if needed for pain. You may also intermittently have blood in the urine until the stent is removed. If you have a catheter, you will be taught how to take care of the catheter by the nursing staff prior to discharge from the hospital.  You may periodically feel a strong urge to void with the catheter in place.  This is a bladder spasm and most often can occur when having a bowel movement or moving around. It is typically self-limited and usually will stop after a few minutes.  You may use some Vaseline or Neosporin around the tip of the catheter to reduce friction at the tip of the penis. You may also see some blood in the urine.  A very small amount of blood can make the urine look quite red.  As long as the catheter is draining well, there usually is not a problem.  However, if the catheter is not draining well and is bloody, you should call the office [phone]) to notify us. Post Anesthesia Home Care Instructions  Activity: Get plenty of rest for the remainder of the day. A responsible adult should stay with you for 24 hours following the procedure.  For the next 24 hours, DO NOT: -Drive a car -Paediatric nurse -Drink alcoholic beverages -Take any medication unless instructed by your physician -Make any legal decisions or sign important papers.  Meals: Start with liquid foods  such as gelatin or soup. Progress to regular foods as tolerated. Avoid greasy, spicy, heavy foods. If nausea and/or vomiting occur, drink only clear liquids until the nausea and/or vomiting subsides. Call your physician if vomiting continues.  Special Instructions/Symptoms: Your throat may feel dry or sore from the anesthesia or the breathing tube placed in your throat during surgery. If this causes discomfort, gargle with warm salt water. The discomfort should disappear within 24 hours. 4.

## 2013-02-18 NOTE — H&P (Signed)
Urology History and Physical Exam  CC: migrated stent on left  HPI: 73 year old male Presents for cystoscopy under anesthesia and retrieval of the left double-J stent which was negotiated out of the bladder during recent nephrostomy tube change.his history is quite complicated and is briefly summarized below:    He had left ureterolysis on 08/07/2009. This was for a large colon cancer that was resected by Dr. Johney Maine. He has had a stent in since that time. He is having no difficulty with the stent. The patient underwent attempted colostomy takedown and reanastomosis. Patient did have a low rectal anastomosis, buT at the time of this procedure, there was a small bladder laceration. He also had new onset right hydronephrosis.    The hydronephrosis was treated with a stent that was placed antegrade. 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 That has been treated with Foley catheter drainage. In February, 2013, he underwent cystogram both by conventional means and with CT that revealed no evidence leakage from his bladder. This catheter was removed, but he had passage of urine through the rectum within a day.     On 06/19/2011, he had cystoscopy, double-J stent exchange, and an aborted fistula repair. Following rectal dilatation, he had his fistula open up significantly. Both Dr. Johney Maine and I felt that, because of this further fistula repair would best be performed by physicians more adept/experienced.    He has seen Dr. Fredderick Phenix as well as a general surgeon at [facility], as well as specialists at the [facility] and [facility]. They have all suggested cystectomy and diversion. He does not want an extirpative procedure at the present time.    He underwent repeat cystoscopy and double-J stent exchange bilaterally on 10/24/2011. That procedure went without difficulty. In followup, he was found to have had an increased creatinine.    The patient ended up  having his nephrostomy tubes removed in April/May, 2014. Unfortunately, his creatinine bumped, and he had his nephrostomy tubes replaced.   He has both nephrostomy tubes and double-J stent.  Despite urging from me and his surgical consultants, he would like to continue both stents and nephrostomy tubes.  During a recent left nephrostomy tube change, his stent was displaced superiorly, outside of his bladder.  He presents at this time for change of both stents and retrieval of the left one.  PMH: Past Medical History  Diagnosis Date  . Urinoma, s/p bladder repair   . History of acute renal failure nov 2012  . Colovesical fistula   . S/P ileostomy     12-20-2010  . Foley catheter in place   . Difficult intubation NOTED ASA IV W/ MAY 2013 AT [facility] MAIN OR--  INTUBATED OVER BOUGIE  BY DR FORTUNE    PT DID OK W/ LMA AT [facility] 10-24-2011  -- DR ROSE  . Nephrostomy status     CURRENTLY HAS BILATERAL TUBES  . History of colon cancer, stage IV     2007 w/ recurrence july 2011--  RECTOSIGMOID [id number] s/p left coloectomy/ lar--  chemo complete feb 2012;  rxt complete jan 2012----   . Iron deficiency anemia   . Rectosigmoid cancer     stage iv--  no recurrent mestastatic disease--  oncologist--  dr Shanon Brow chism ([facility])  . Hydronephrosis, bilateral     Status post surgical intervention  W/ URETERAL STENTS  . Chronic kidney disease (CKD), stage III (moderate) SECONDARY UNCLEAR ETIOLOGY, POSSIBLE RELATED RADIATION THERAPY  NEPHROLOGIST,  DR Marval Regal  . Acute-on-chronic kidney injury     SECONDARY TO OBSTRUCTIVE UROPATHY--  BILATERAL URETERAL STENTS, BILATERAL NEPHROSTOMY TUBES AND FOLEY CATHETER    PSH: Past Surgical History  Procedure Laterality Date  . Portacath placement  09-17-2009  . Colostomy takedown  12/20/2010    Procedure: LAPAROSCOPIC COLOSTOMY TAKEDOWN;  Surgeon: Adin Hector, MD;  Location: [facility];  Service: General;  Laterality: N/A;  Laparoscopy Colostomy Takedown  With Ileostomy  . Port-a-cath removal  12/20/2010    Procedure: REMOVAL PORT-A-CATH;  Surgeon: Adin Hector, MD;  Location: [facility];  Service: General;  Laterality: Right;  Right  venous catheter no longer needed.  . Cystoscopy w/ ureteral stent placement  01/13/2011    Procedure: CYSTOSCOPY WITH RETROGRADE PYELOGRAM/URETERAL STENT PLACEMENT;  Surgeon: Franchot Gallo;  Location: [facility];  Service: Urology;  Laterality: Left;  intra-operative cystogram  . Tonsillectomy  yrs ago  . Fracture surgery  1950'S    RIGHT ARM SURGERY FOR FX  . Examination under anesthesia  06/19/2011    Procedure: EXAM UNDER ANESTHESIA;  Surgeon: Adin Hector, MD;  Location: [facility];  Service: General;;  . Cystoscopy w/ ureteral stent placement  06/19/2011    Procedure: CYSTOSCOPY WITH RETROGRADE PYELOGRAM/URETERAL STENT PLACEMENT;  Surgeon: Franchot Gallo, MD;  Location: [facility];  Service: Urology;  Laterality: Bilateral;  Cystoscopy/Bilateral Double J Stent insertion , flexible cystoscopy   . Cystoscopy w/ ureteral stent placement  10/24/2011    Procedure: CYSTOSCOPY WITH STENT REPLACEMENT;  Surgeon: Franchot Gallo, MD;  Location: [facility];  Service: Urology;  Laterality: Bilateral;  45 mins requested for this case   . Cystoscopy w/ retrogrades  10/24/2011    Procedure: CYSTOSCOPY WITH RETROGRADE PYELOGRAM;  Surgeon: Franchot Gallo, MD;  Location: [facility];  Service: Urology;  Laterality: Left;  . Transanal resection of rectal polyp and sigmoid colectomy  03/15/2004  . Left hydrocele surgery  03/16/2009  . Cystoscopy w/ ureteral stent placement Bilateral 04/09/2012    Procedure: CYSTOSCOPY WITH bilateral  STENT REPLACEMENT;  Surgeon: Franchot Gallo, MD;  Location: [facility];  Service: Urology;  Laterality: Bilateral;  . Cysto/ left retrograde pyelogram/ left stent placement  03-01-2010  &  09-13-2010  . Multiple nephrostomy tube changes  every six weeks  .  Colon surgery  08/07/09    Extensive lysis adhesions/ Re-do Low Anterior Rescection, Reconstruction left external iliac artery/ Left ureterolysis and ureter disposition  . Cystoscopy w/ ureteral stent placement Bilateral 12/13/2012    Procedure: CYSTOSCOPY WITH STENT REPLACEMENT;  Surgeon: Franchot Gallo, MD;  Location: [facility];  Service: Urology;  Laterality: Bilateral;    Allergies: No Known Allergies  Medications: No prescriptions prior to admission     Social History: History   Social History  . Marital Status: Married    Spouse Name: N/A    Number of Children: N/A  . Years of Education: N/A   Occupational History  . Not on file.   Social History Main Topics  . Smoking status: Former Smoker -- 1.00 packs/day for 10 years    Quit date: 02/03/1974  . Smokeless tobacco: Never Used  . Alcohol Use: No  . Drug Use: No  . Sexual Activity: Not on file   Other Topics Concern  . Not on file   Social History Narrative   Married. Lives with wife. Independent of ADLs.    Family History: Family History  Problem Relation Age of Onset  .  Stroke Father 49  . Cancer Father     colon  . Lupus Daughter     Review of Systems: Positive: N/A Negative: .  A further 10 point review of systems was negative except what is listed in                 the HPI.  Physical Exam: @VITALS2 @ General: No acute distress.  Awake. Head:  Normocephalic.  Atraumatic. ENT:  EOMI.  Mucous membranes moist Neck:  Supple.  No lymphadenopathy. CV:  S1 present. S2 present. Regular rate. Pulmonary: Equal effort bilaterally.  Clear to auscultation bilaterally. Abdomen: Soft.  nontender to palpation.ostomy present Skin:  Normal turgor.  No visible rash. Extremity: No gross deformity of bilateral upper extremities.  No gross deformity of                             lower extremities. Neurologic: Alert. Appropriate mood.    Studies:  No results found for this basename: HGB,  WBC, PLT,  in the last 72 hours  No results found for this basename: NA, K, CL, CO2, BUN, CREATININE, CALCIUM, MAGNESIUM, GFRNONAA, GFRAA,  in the last 72 hours   No results found for this basename: PT, INR, APTT,  in the last 72 hours   No components found with this basename: ABG,     Assessment:  Migrated left double-J stent  Plan: Cystoscopy, bilateral double-J stent exchange and retrieval of left migrated stent

## 2013-02-18 NOTE — Anesthesia Preprocedure Evaluation (Addendum)
Anesthesia Evaluation  Patient identified by MRN, date of birth, ID band Patient awake    Reviewed: Allergy & Precautions, H&P , NPO status , Patient's Chart, lab work & pertinent test results  History of Anesthesia Complications (+) DIFFICULT AIRWAY  Airway Mallampati: IV TM Distance: <3 FB Neck ROM: Full  Mouth opening: Limited Mouth Opening  Dental no notable dental hx. (+) Teeth Intact and Dental Advisory Given   Pulmonary neg pulmonary ROS, former smoker,  breath sounds clear to auscultation  Pulmonary exam normal       Cardiovascular negative cardio ROS  Rhythm:Regular Rate:Normal     Neuro/Psych negative neurological ROS  negative psych ROS   GI/Hepatic Neg liver ROS, Colon cancer 2007, recurrent [id number]   History of colon cancer, stage IV, Stage 4, [id number] (Chem last Feb12/ Radiation-last Jan12   Endo/Other  negative endocrine ROS  Renal/GU Renal InsufficiencyRenal disease  negative genitourinary   Musculoskeletal negative musculoskeletal ROS (+)   Abdominal   Peds  Hematology negative hematology ROS (+)   Anesthesia Other Findings Marked overbite with diminished oral radius.  Reproductive/Obstetrics                         Anesthesia Physical Anesthesia Plan  ASA: III  Anesthesia Plan: General   Post-op Pain Management:    Induction: Intravenous  Airway Management Planned: LMA  Additional Equipment:   Intra-op Plan:   Post-operative Plan: Extubation in OR  Informed Consent: I have reviewed the patients History and Physical, chart, labs and discussed the procedure including the risks, benefits and alternatives for the proposed anesthesia with the patient or authorized representative who has indicated his/her understanding and acceptance.   Dental advisory given  Plan Discussed with: CRNA  Anesthesia Plan Comments: (Prior general with LMA without complication.)         Anesthesia Quick Evaluation

## 2013-02-18 NOTE — Transfer of Care (Signed)
Immediate Anesthesia Transfer of Care Note  Patient: Eric Munoz  Procedure(s) Performed: Procedure(s) (LRB): CYSTOSCOPY WITH URETEROSCOPY AND RETRIEVAL OF LEFT URETERAL STENT  (Left)  Patient Location: PACU  Anesthesia Type: General  Level of Consciousness: awake, oriented, sedated and patient cooperative  Airway & Oxygen Therapy: Patient Spontanous Breathing and Patient connected to face mask oxygen  Post-op Assessment: Report given to PACU RN and Post -op Vital signs reviewed and stable  Post vital signs: Reviewed and stable  Complications: No apparent anesthesia complications

## 2013-02-18 NOTE — Anesthesia Procedure Notes (Signed)
Procedure Name: LMA Insertion Date/Time: 02/18/2013 12:07 PM Performed by: Denna Haggard D Pre-anesthesia Checklist: Patient identified, Emergency Drugs available, Suction available and Patient being monitored Patient Re-evaluated:Patient Re-evaluated prior to inductionOxygen Delivery Method: Circle System Utilized Preoxygenation: Pre-oxygenation with 100% oxygen Intubation Type: IV induction Ventilation: Mask ventilation without difficulty LMA: LMA inserted LMA Size: 4.0 Number of attempts: 1 Airway Equipment and Method: bite block Placement Confirmation: positive ETCO2 Tube secured with: Tape Dental Injury: Teeth and Oropharynx as per pre-operative assessment

## 2013-02-18 NOTE — Op Note (Signed)
PATIENT:  Eric Munoz  PRE-OPERATIVE DIAGNOSIS: Migrated left ureteral stent, bilateral ureteral obstruction  POST-OPERATIVE DIAGNOSIS: Same  PROCEDURE: Cystoscopy, left ureteroscopy with retrieval of left double-J stent, exchange of bilateral double-J stent  SURGEON:  Lillette Boxer. Sinai Mahany, M.D.  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 He has bilateral ureteral obstruction. He has nephrostomy tubes in place, and most recently in December his left ureteral stent was displaced proximally during nephrostomy tube change. He presents at this time for ureteral stent change on the left as well as a right, utilizing ureteroscopy on the left retrieved the stent.  Description of procedure: The patient was properly identified and marked (if applicable) in the holding area. They were then  taken to the operating room and placed on the table in a supine position. General anesthesia was then administered. Once fully anesthetized the patient was moved to the dorsolithotomy position and the genitalia and perineum were sterilely prepped and draped in standard fashion. An official timeout was then performed.  I passed a 46 [place] panendoscope through the urethra. 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 The bladder was entered. The right ureteral stent was identified at the ureteral orifice which was performed. No mucosal lesions were seen. I negotiated a guidewire through the left ureteral orifice which was a bit proximally located, and using fluoroscopy pass this into the left renal pelvis. The cystoscope was removed and I then negotiated a 6 French semirigid ureteroscope up the left ureter, where the ureteral stent was identified approximately 3 cm proximal. I tried grasping forceps  which would not get a good purchase on the stent. I then passed a Gemini basket through the distal and of the stent, opened the basket, and then extracted the stent into the urethra. At this point the ureteroscope was removed, and the cystoscope was passed up to the distal end of the stent which was then extracted. Using the cystoscope, I then passed a new 7 [place] by 24 cm contour stent over the guidewire, and positioned it fluoroscopically. The guidewire was removed, and at this point good proximal and distal curls were seen using fluoroscopic guidance and cystoscopic guidance, respectively. I then exchanged the remaining right double-J stent, replacing it with a new 7 x 24 cm contour stent. Again, good proximal and distal curls were seen once a guidewire was removed. The scope was removed, and I placed a 16 French coud-tip catheter. This was hooked to dependent drainage.      PLAN OF CARE: Discharge to home after PACU  PATIENT DISPOSITION:  PACU - hemodynamically stable.

## 2013-02-19 NOTE — Anesthesia Postprocedure Evaluation (Signed)
Anesthesia Post Note  Patient: Eric Munoz  Procedure(s) Performed: Procedure(s) (LRB): CYSTOSCOPY WITH URETEROSCOPY AND BILATERAL STENT EXCHANGE (Bilateral)  Anesthesia type: General  Patient location: PACU  Post pain: Pain level controlled  Post assessment: Post-op Vital signs reviewed  Last Vitals:  Filed Vitals:   02/18/13 1429  BP: 138/80  Pulse: 99  Temp: 35.9 C  Resp: 18    Post vital signs: Reviewed  Level of consciousness: sedated  Complications: No apparent anesthesia complications

## 2013-02-21 ENCOUNTER — Encounter (HOSPITAL_BASED_OUTPATIENT_CLINIC_OR_DEPARTMENT_OTHER): Payer: Self-pay | Admitting: Urology

## 2013-02-22 DIAGNOSIS — N183 Chronic kidney disease, stage 3 unspecified: Secondary | ICD-10-CM | POA: Diagnosis not present

## 2013-03-01 ENCOUNTER — Other Ambulatory Visit: Payer: Self-pay | Admitting: Urology

## 2013-03-01 ENCOUNTER — Ambulatory Visit (HOSPITAL_COMMUNITY)
Admission: RE | Admit: 2013-03-01 | Discharge: 2013-03-01 | Disposition: A | Discharge status: Home / Self Care | Payer: BC Managed Care – PPO | Source: Ambulatory Visit | Attending: Urology | Admitting: Urology

## 2013-03-01 DIAGNOSIS — N135 Crossing vessel and stricture of ureter without hydronephrosis: Secondary | ICD-10-CM | POA: Diagnosis not present

## 2013-03-01 DIAGNOSIS — N1330 Unspecified hydronephrosis: Secondary | ICD-10-CM

## 2013-03-01 MED ORDER — IOHEXOL 300 MG/ML  SOLN
50.0000 mL | Freq: Once | INTRAMUSCULAR | Status: AC | PRN
  Administered 2013-03-01: 10 mL

## 2013-03-01 NOTE — Procedures (Signed)
Procedure:  Bilateral nephrostomy tube change Findings:  New 10 Fr PCN's placed.

## 2013-03-02 ENCOUNTER — Encounter: Payer: Self-pay | Admitting: Internal Medicine

## 2013-03-02 ENCOUNTER — Telehealth: Payer: Self-pay | Admitting: Internal Medicine

## 2013-03-02 ENCOUNTER — Ambulatory Visit (HOSPITAL_BASED_OUTPATIENT_CLINIC_OR_DEPARTMENT_OTHER): Payer: BC Managed Care – PPO | Admitting: Internal Medicine

## 2013-03-02 ENCOUNTER — Other Ambulatory Visit (HOSPITAL_BASED_OUTPATIENT_CLINIC_OR_DEPARTMENT_OTHER): Payer: BC Managed Care – PPO

## 2013-03-02 VITALS — BP 124/70 | HR 92 | Temp 97.4°F | Resp 20 | Ht 69.0 in | Wt 154.4 lb

## 2013-03-02 DIAGNOSIS — Z86010 Personal history of colonic polyps: Secondary | ICD-10-CM

## 2013-03-02 DIAGNOSIS — D509 Iron deficiency anemia, unspecified: Secondary | ICD-10-CM | POA: Diagnosis not present

## 2013-03-02 DIAGNOSIS — Z85038 Personal history of other malignant neoplasm of large intestine: Secondary | ICD-10-CM

## 2013-03-02 DIAGNOSIS — C19 Malignant neoplasm of rectosigmoid junction: Secondary | ICD-10-CM | POA: Diagnosis not present

## 2013-03-02 DIAGNOSIS — N360 Urethral fistula: Secondary | ICD-10-CM

## 2013-03-02 DIAGNOSIS — J449 Chronic obstructive pulmonary disease, unspecified: Secondary | ICD-10-CM | POA: Diagnosis not present

## 2013-03-02 LAB — CBC & DIFF AND RETIC
BASO%: 0.3 % (ref 0.0–2.0)
Basophils Absolute: 0 10*3/uL (ref 0.0–0.1)
EOS ABS: 0.3 10*3/uL (ref 0.0–0.5)
EOS%: 4.4 % (ref 0.0–7.0)
HEMATOCRIT: 30.8 % — AB (ref 38.4–49.9)
HGB: 10.1 g/dL — ABNORMAL LOW (ref 13.0–17.1)
IMMATURE RETIC FRACT: 6.8 % (ref 3.00–10.60)
LYMPH%: 6.7 % — ABNORMAL LOW (ref 14.0–49.0)
MCH: 29.8 pg (ref 27.2–33.4)
MCHC: 32.8 g/dL (ref 32.0–36.0)
MCV: 90.9 fL (ref 79.3–98.0)
MONO#: 0.6 10*3/uL (ref 0.1–0.9)
MONO%: 9.9 % (ref 0.0–14.0)
NEUT%: 78.7 % — ABNORMAL HIGH (ref 39.0–75.0)
NEUTROS ABS: 4.8 10*3/uL (ref 1.5–6.5)
PLATELETS: 274 10*3/uL (ref 140–400)
RBC: 3.39 10*6/uL — AB (ref 4.20–5.82)
RDW: 15 % — ABNORMAL HIGH (ref 11.0–14.6)
RETIC %: 1.1 % (ref 0.80–1.80)
Retic Ct Abs: 37.29 10*3/uL (ref 34.80–93.90)
WBC: 6.2 10*3/uL (ref 4.0–10.3)
lymph#: 0.4 10*3/uL — ABNORMAL LOW (ref 0.9–3.3)

## 2013-03-02 LAB — COMPREHENSIVE METABOLIC PANEL (CC13)
ALBUMIN: 3.5 g/dL (ref 3.5–5.0)
ALK PHOS: 57 U/L (ref 40–150)
ALT: 13 U/L (ref 0–55)
AST: 11 U/L (ref 5–34)
Anion Gap: 10 mEq/L (ref 3–11)
BILIRUBIN TOTAL: 0.26 mg/dL (ref 0.20–1.20)
BUN: 55.1 mg/dL — ABNORMAL HIGH (ref 7.0–26.0)
CO2: 20 mEq/L — ABNORMAL LOW (ref 22–29)
Calcium: 9.8 mg/dL (ref 8.4–10.4)
Chloride: 113 mEq/L — ABNORMAL HIGH (ref 98–109)
Creatinine: 3.3 mg/dL (ref 0.7–1.3)
Glucose: 104 mg/dl (ref 70–140)
Potassium: 4.5 mEq/L (ref 3.5–5.1)
SODIUM: 143 meq/L (ref 136–145)
Total Protein: 6.9 g/dL (ref 6.4–8.3)

## 2013-03-02 LAB — CEA: CEA: 1.7 ng/mL (ref 0.0–5.0)

## 2013-03-02 NOTE — Patient Instructions (Signed)

## 2013-03-02 NOTE — Progress Notes (Signed)
[place], [place] [address]  DIAGNOSIS: History of colon cancer, stage IV, Stage 4, [id number] (Chem last Feb12/ Radiation-last Jan12) - Plan: CBC with Differential, Comprehensive metabolic panel (Cmet) - [facility], Ferritin, Iron and TIBC, CBC with Differential, Ferritin, Iron and TIBC, CEA  Rectosigmoid cancer, stage IV  Colourethral fistula   Chief Complaint  Patient presents with  . History of colon cancer, stage IV, Stage 4, [id number] (Chem     CURRENT THERAPY:  Observation.  INTERVAL HISTORY: SPYRIDON HORNSTEIN 73 y.o. 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 He was last seen by me on 11/02/2012. His last PET scan was on 06/16/2011 which showed no evidence of cancer. On 03/11/2012, he received feraheme 510 mg IV x1 do to a ferritin drop to 32. Today he is without complaints.   Last visit, he reported being seen at the [facility] clinic in July 2014 by Dr. Dia Crawford who concurred with the surgeon's opinion, Dr. Terance Hart of [place]  (as discussed previously). Dr. Terance Hart recommended that he have a cystectomy and a radical urinary diversion. Yesterday he started talking with a surgeon at [facility] clinic regarding further management of his colo-vesicle fistula and he is having his medical records forwarded. He reports continued weight gain with 10 pounds over the last 6 months. Continues to have his nephrostomy tubes changed every 6 weeks. Denies any recent hospitalizations or emergency room visits. He denies any fevers or chills or acute shortness of breath. He also denies trouble with fatigue, anorexia, dyspnea.  MEDICAL HISTORY: Past Medical History  Diagnosis Date  . Urinoma, s/p bladder repair   . History of acute renal failure nov 2012  . Colovesical fistula   .  S/P ileostomy     12-20-2010  . Foley catheter in place   . Difficult intubation NOTED ASA IV W/ MAY 2013 AT [facility] MAIN OR--  INTUBATED OVER BOUGIE  BY DR FORTUNE    PT DID OK W/ LMA AT [facility] 10-24-2011  -- DR ROSE  . Nephrostomy status     CURRENTLY HAS BILATERAL TUBES  . History of colon cancer, stage IV     2007 w/ recurrence july 2011--  RECTOSIGMOID [id number] s/p left coloectomy/ lar--  chemo complete feb 2012;  rxt complete jan 2012----   . Iron deficiency anemia   . Rectosigmoid cancer     stage iv--  no recurrent mestastatic disease--  oncologist--  dr Shanon Brow Leanndra Pember ([facility])  . Hydronephrosis, bilateral     Status post surgical intervention  W/ URETERAL STENTS  . Chronic kidney disease (CKD), stage III (moderate) SECONDARY UNCLEAR ETIOLOGY, POSSIBLE RELATED RADIATION THERAPY      NEPHROLOGIST,  DR Marval Regal  . Acute-on-chronic kidney injury     SECONDARY TO OBSTRUCTIVE UROPATHY--  BILATERAL URETERAL STENTS, BILATERAL NEPHROSTOMY TUBES AND FOLEY CATHETER    INTERIM HISTORY: has History of adenomatous polyp of rectum s/p low LAR; History of colon cancer, stage IV, Stage 4, [id number] (Chem last Feb12/ Radiation-last Jan12); Ureteral stricture, left; Loop diverting ileostomy, [id number]; Colourethral fistula ; Renal insufficiency; Unspecified deficiency anemia; Tachycardia; UTI (lower urinary tract infection); Acute renal failure in the setting of indwelling ureteral stents and bilateral hydronephrosis; Hyperkalemia; Metabolic acidosis; Normocytic anemia; Rectosigmoid cancer, stage IV; Pyonephrosis; and Hypokalemia on his problem list.  ALLERGIES:  has No Known Allergies.  MEDICATIONS: has a current medication list which includes the following prescription(s): multivitamin with minerals and feeding supplement.  SURGICAL HISTORY:  Past Surgical History  Procedure Laterality Date  . Portacath placement  09-17-2009  . Colostomy takedown  12/20/2010    Procedure: LAPAROSCOPIC  COLOSTOMY TAKEDOWN;  Surgeon: [name], MD;  Location: [facility];  Service: General;  Laterality: N/A;  Laparoscopy Colostomy Takedown With Ileostomy  . Port-a-cath removal  12/20/2010    Procedure: REMOVAL PORT-A-CATH;  Surgeon: [name], MD;  Location: [facility];  Service: General;  Laterality: Right;  Right  venous catheter no longer needed.  . Cystoscopy w/ ureteral stent placement  01/13/2011    Procedure: CYSTOSCOPY WITH RETROGRADE PYELOGRAM/URETERAL STENT PLACEMENT;  Surgeon: [name];  Location: [facility];  Service: Urology;  Laterality: Left;  intra-operative cystogram  . Tonsillectomy  yrs ago  . Fracture surgery  1950'S    RIGHT ARM SURGERY FOR FX  . Examination under anesthesia  06/19/2011    Procedure: EXAM UNDER ANESTHESIA;  Surgeon: [name], MD;  Location: [facility];  Service: General;;  . Cystoscopy w/ ureteral stent placement  06/19/2011    Procedure: CYSTOSCOPY WITH RETROGRADE PYELOGRAM/URETERAL STENT PLACEMENT;  Surgeon: [name], MD;  Location: [facility];  Service: Urology;  Laterality: Bilateral;  Cystoscopy/Bilateral Double J Stent insertion , flexible cystoscopy   . Cystoscopy w/ ureteral stent placement  10/24/2011    Procedure: CYSTOSCOPY WITH STENT REPLACEMENT;  Surgeon: [name], MD;  Location: [facility];  Service: Urology;  Laterality: Bilateral;  45 mins requested for this case   . Cystoscopy w/ retrogrades  10/24/2011    Procedure: CYSTOSCOPY WITH RETROGRADE PYELOGRAM;  Surgeon: [name], MD;  Location: [facility];  Service: Urology;  Laterality: Left;  . Transanal resection of rectal polyp and sigmoid colectomy  03/15/2004  . Left hydrocele surgery  03/16/2009  . Cystoscopy w/ ureteral stent placement Bilateral 04/09/2012    Procedure: CYSTOSCOPY WITH bilateral  STENT REPLACEMENT;  Surgeon: [name], MD;  Location[facility];  Service: Urology;  Laterality: Bilateral;  .  Cysto/ left retrograde pyelogram/ left stent placement  03-01-2010  &  09-13-2010  . Multiple nephrostomy tube changes  every six weeks  . Colon surgery  08/07/09    Extensive lysis adhesions/ Re-do Low Anterior Rescection, Reconstruction left external iliac artery/ Left ureterolysis and ureter disposition  . Cystoscopy w/ ureteral stent placement Bilateral 12/13/2012    Procedure: CYSTOSCOPY WITH STENT REPLACEMENT;  Surgeon: [name], MD;  Location: [facility];  Service: Urology;  Laterality: Bilateral;  . Cystoscopy with ureteroscopy and stent placement Bilateral 02/18/2013    Procedure: CYSTOSCOPY WITH URETEROSCOPY AND BILATERAL STENT EXCHANGE;  Surgeon: [name], MD;  Location: [facility];  Service: Urology;  Laterality: Bilateral;   PROBLEM LIST: 1. Adenocarcinoma of the rectosigmoid stage IVA, T4b N2a M1a cancer involving with involvement of periaortic lymph node. Diagnosis was established in July of 2011. The patient underwent rather extensive surgery on both July 5th and August 08, 2009. There were multiple tumor nodules. Six out of 26 lymph nodes were involved along with adjacent small bowel. There was involvement of a periaortic lymph node. There was a close radial margin. There was tumor thrombus in a vein. There was extracapsular extension, lymphovascular and perineural invasion, invasion of tumor into the left external iliac artery and left ureter. KRAS mutation was not detected,  and thus the tumor is KRAS wild type. Surgery involved a colostomy and placement of a left ureteral stent. There was a question of whether this represented a de novo cancer or possibly recurrence from a previous sigmoid well-differentiated adenocarcinoma that was resected by Dr. Georganna Skeans on 03/15/2004. This tumor arose apparently in a polyp with 0/3 lymph nodes and was felt to be T1 stage I. The margin, however, was 0.5 mm. After the patient's surgery in July 2011, he  received 7 cycles of FOLFOX from 09/26/2009 through 12/24/2009. The patient received pelvic radiation with continuous infusion 5-fluorouracil from 01/07/2010 through 02/15/2009. The patient then received 3 cycles of 5-FU leucovorin and 5-FU by continuous infusion from 03/04/2010 through 04/01/2010. 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 PET scan carried out on 06/16/2011  showed no evidence of recurrent or metastatic disease.  2. Ileostomy, 12/20/2010.  3. Right leg weakness secondary to femoral nerve injuries following  the patient's surgery in July 2011. Currently resolved.  4. The patient underwent extensive surgery on December 20, 2010. This consisted of lysis of adhesions, laparoscopic-assisted splenic flexure mobilization, laparoscopic colostomy takedown, primary repair of bladder injury, diverting loop ileostomy in the right upper quadrant, and removal of the patient's Port-A-Cath.  5. Bilateral ureteral stents, most recently exchanged this month on 03/01/2013 by Dr. Diona Fanti.  6. 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  7. Progressive weight loss.  8. Anemia noted in January 2013 with a prior history of iron  deficiency anemia requiring intravenous Feraheme 510 mg  on 09/26/2009 and on 10/10/2009. Renal insufficiency is probably contributing to this patient's anemia at the present time.  9. Development of renal insufficiency noted in May 2013 with acute decompensation noted on 11/04/2011.  10. Bilateral percutaneous nephrostomies carried out on January 10, 2012 for  bilateral hydronephrosis. These tubes were removed in April 2014 but  needed to be reinserted on 06/15/2012 because of renal decompensation.   REVIEW OF SYSTEMS:   Constitutional: Denies fevers, chills or abnormal weight loss Eyes: Denies blurriness of vision Ears, nose, mouth, throat, and face: Denies mucositis or sore throat Respiratory: Denies cough, dyspnea or  wheezes Cardiovascular: Denies palpitation, chest discomfort or lower extremity swelling Gastrointestinal:  Denies nausea, heartburn or change in bowel habits Skin: Denies abnormal skin rashes Lymphatics: Denies new lymphadenopathy or easy bruising Neurological:Denies numbness, tingling or new weaknesses Behavioral/Psych: Mood is stable, no new changes  All other systems were reviewed with the patient and are negative.  PHYSICAL EXAMINATION: ECOG PERFORMANCE STATUS: 0 - Asymptomatic  Blood pressure 124/70, pulse 92, temperature 97.4 F (36.3 C), temperature source Oral, resp. rate 20, height $RemoveBe'5\' 9"'NbWIyhHDa$  (1.753 m), weight 154 lb 6.4 oz (70.035 kg), SpO2 100.00%.  GENERAL:alert, no distress and comfortable thin middle-aged male SKIN: skin color, texture, turgor are normal, no rashes or significant lesions EYES: normal, Conjunctiva are pink and non-injected, sclera clear OROPHARYNX:no exudate, no erythema and lips, buccal mucosa, and tongue normal  NECK: supple, thyroid normal size, non-tender, without nodularity LYMPH:  no palpable lymphadenopathy in the cervical, axillary or supraclavicular LUNGS: clear to auscultation and percussion with normal breathing effort HEART: regular rate & rhythm and no murmurs and one plus pretibial edema (left greater than right) ABDOMEN:abdomen soft, non-tender and normal bowel sounds; ileostomy bag in right mid abdomen; he does also have bilateral nephrostomy tubes in place. Also has a Foley catheter with urine that is clear. Musculoskeletal:no cyanosis of digits and no clubbing  NEURO: alert & oriented x 3 with  fluent speech, no focal motor/sensory deficits   LABORATORY DATA: Results for orders placed in visit on 03/02/13 (from the past 48 hour(s))  CBC & DIFF AND RETIC     Status: Abnormal   Collection Time    03/02/13 10:18 AM      Result Value Range   WBC 6.2  4.0 - 10.3 10e3/uL   NEUT# 4.8  1.5 - 6.5 10e3/uL   HGB 10.1 (*) 13.0 - 17.1 g/dL   HCT 30.8  (*) 38.4 - 49.9 %   Platelets 274  140 - 400 10e3/uL   MCV 90.9  79.3 - 98.0 fL   MCH 29.8  27.2 - 33.4 pg   MCHC 32.8  32.0 - 36.0 g/dL   RBC 3.39 (*) 4.20 - 5.82 10e6/uL   RDW 15.0 (*) 11.0 - 14.6 %   lymph# 0.4 (*) 0.9 - 3.3 10e3/uL   MONO# 0.6  0.1 - 0.9 10e3/uL   Eosinophils Absolute 0.3  0.0 - 0.5 10e3/uL   Basophils Absolute 0.0  0.0 - 0.1 10e3/uL   NEUT% 78.7 (*) 39.0 - 75.0 %   LYMPH% 6.7 (*) 14.0 - 49.0 %   MONO% 9.9  0.0 - 14.0 %   EOS% 4.4  0.0 - 7.0 %   BASO% 0.3  0.0 - 2.0 %   Retic % 1.10  0.80 - 1.80 %   Retic Ct Abs 37.29  34.80 - 93.90 10e3/uL   Immature Retic Fract 6.80  3.00 - 10.60 %  COMPREHENSIVE METABOLIC PANEL (HY86)     Status: Abnormal   Collection Time    03/02/13 10:18 AM      Result Value Range   Sodium 143  136 - 145 mEq/L   Potassium 4.5  3.5 - 5.1 mEq/L   Chloride 113 (*) 98 - 109 mEq/L   CO2 20 (*) 22 - 29 mEq/L   Glucose 104  70 - 140 mg/dl   BUN 55.1 (*) 7.0 - 26.0 mg/dL   Creatinine 3.3 (*) 0.7 - 1.3 mg/dL   Total Bilirubin 0.26  0.20 - 1.20 mg/dL   Alkaline Phosphatase 57  40 - 150 U/L   AST 11  5 - 34 U/L   ALT 13  0 - 55 U/L   Total Protein 6.9  6.4 - 8.3 g/dL   Albumin 3.5  3.5 - 5.0 g/dL   Calcium 9.8  8.4 - 10.4 mg/dL   Anion Gap 10  3 - 11 mEq/L  CEA     Status: None   Collection Time    03/02/13 10:18 AM      Result Value Range   CEA 1.7  0.0 - 5.0 ng/mL       Labs:  Lab Results  Component Value Date   WBC 6.2 03/02/2013   HGB 10.1* 03/02/2013   HCT 30.8* 03/02/2013   MCV 90.9 03/02/2013   PLT 274 03/02/2013   NEUTROABS 4.8 03/02/2013      Chemistry      Component Value Date/Time   NA 143 03/02/2013 1018   NA 145 02/18/2013 1124   K 4.5 03/02/2013 1018   K 4.3 02/18/2013 1124   CL 115* 02/18/2013 1124   CL 113* 07/01/2012 1427   CO2 20* 03/02/2013 1018   CO2 12* 06/15/2012 0925   BUN 55.1* 03/02/2013 1018   BUN 46* 02/18/2013 1124   CREATININE 3.3* 03/02/2013 1018   CREATININE 3.30* 02/18/2013 1124   CREATININE 2.04*  01/08/2011 1540  Component Value Date/Time   CALCIUM 9.8 03/02/2013 1018   CALCIUM 9.4 06/15/2012 0925   ALKPHOS 57 03/02/2013 1018   ALKPHOS 114 06/11/2012 1725   AST 11 03/02/2013 1018   AST 15 06/11/2012 1725   ALT 13 03/02/2013 1018   ALT 33 06/11/2012 1725   BILITOT 0.26 03/02/2013 1018   BILITOT 0.2* 06/11/2012 1725      Iron/TIBC/Ferritin    Component Value Date/Time   IRON 64 11/02/2012 1102   IRON 47 07/01/2012 1427   TIBC 306 11/02/2012 1102   TIBC 260 07/01/2012 1427   FERRITIN 67 11/02/2012 1102   FERRITIN 176 07/01/2012 1427   RADIOGRAPHIC STUDIES: Ir Nephrostomy Tube Change  10/26/2012   CLINICAL DATA:  colon carcinoma, ileal conduit, ureteral obstruction despite bilateral ureteral stents, requiring long-term percutaneous nephrostomy drainage.  EXAM: BILATERAL PERCUTANEOUS NEPHROSTOMY CATHETER EXCHANGE UNDER FLUOROSCOPY  TECHNIQUE: The procedure, risks (including but not limited to bleeding, infection, organ damage ), benefits, and alternatives were explained to the patient. Questions regarding the procedure were encouraged and answered. The patient understands and consents to the procedure. The nephrostomy tube and surrounding skin were prepped with Betadine, draped in usual sterile fashion.  A small amount of contrast was injected through the right nephrostomy catheter to opacify the renal collecting system. The catheter was cut and exchanged over a 0.035" angiographic wire for a new 10-French pigtail catheter, formed centrally within the collecting system under fluoroscopy. Contrast injection confirms appropriate positioning.  In a similar fashion, the left nephrostomy catheter was injected, cut, and exchanged for a new 10-French pigtail catheter, formed centrally within the left renal collecting system. Injection confirms appropriate positioning and patency. Both catheters were secured externally with 0 Prolene sutures. The patient tolerated the procedure well, with no immediate  complication.  FLUOROSCOPY TIME:  1 min 18 seconds  IMPRESSION: 1. Technically successful exchange of bilateral nephrostomy catheters under fluoroscopy   Electronically Signed   By: Oley Balm M.D.   On: 10/26/2012 17:11   Ir Nephrostomy Tube Change  10/26/2012   CLINICAL DATA:  colon carcinoma, ileal conduit, ureteral obstruction despite bilateral ureteral stents, requiring long-term percutaneous nephrostomy drainage.  EXAM: BILATERAL PERCUTANEOUS NEPHROSTOMY CATHETER EXCHANGE UNDER FLUOROSCOPY  TECHNIQUE: The procedure, risks (including but not limited to bleeding, infection, organ damage ), benefits, and alternatives were explained to the patient. Questions regarding the procedure were encouraged and answered. The patient understands and consents to the procedure. The nephrostomy tube and surrounding skin were prepped with Betadine, draped in usual sterile fashion.  A small amount of contrast was injected through the right nephrostomy catheter to opacify the renal collecting system. The catheter was cut and exchanged over a 0.035" angiographic wire for a new 10-French pigtail catheter, formed centrally within the collecting system under fluoroscopy. Contrast injection confirms appropriate positioning.  In a similar fashion, the left nephrostomy catheter was injected, cut, and exchanged for a new 10-French pigtail catheter, formed centrally within the left renal collecting system. Injection confirms appropriate positioning and patency. Both catheters were secured externally with 0 Prolene sutures. The patient tolerated the procedure well, with no immediate complication.  FLUOROSCOPY TIME:  1 min 18 seconds  IMPRESSION: 1. Technically successful exchange of bilateral nephrostomy catheters under fluoroscopy   Electronically Signed   By: Oley Balm M.D.   On: 10/26/2012 17:11   RADIOLOGY REPORT*  (07/01/2012)  Clinical Data: Colon cancer. No chest complaints. Previous smoker CHEST - 2 VIEW Comparison:  None. Findings: Mild hyperinflation with flattening  of hemidiaphragms suggest some degree of underlying COPD. A mild pectus deformity is  noted. Taking these findings into consideration, heart and  mediastinal contours are within normal limits. The lung fields  appear clear with no signs of focal infiltrate or congestive  failure. No pleural fluid or significant peribronchial cuffing is  seen. No parenchymal nodularity is identified to suggest pulmonary metastatic disease. Bony structures appear intact. The top of the patient's indwelling nephrostomy tubes are seen on the lateral view. IMPRESSION: Changes of mild underlying COPD. No acute or focal abnormality seen  ASSESSMENT: Eric Munoz 73 y.o. male with a history of History of colon cancer, stage IV, Stage 4, [phone] (Chem last Feb12/ Radiation-last Jan12) - Plan: CBC with Differential, Comprehensive metabolic panel (Cmet) - [facility], Ferritin, Iron and TIBC, CBC with Differential, Ferritin, Iron and TIBC, CEA  Rectosigmoid cancer, stage IV  Colourethral fistula    PLAN:  --Mr. Savino continues to do well without evidence of recurrent, mestastatic disease.  We reviewed his laboratories and scans including his PET scan done on 06/16/2011. He also review records concerning his colovesical fistula. He has sought out for a second opinion from [facility] clinic with concurrence of the opinion  of Dr. Terance Hart from [facility] concerning a cystectomy and a radical urinary diversion. However, the patient has been reluctant to undergo radical surgery.  We reviewed the chest x-ray obtained on his last visit that showed no evidence of acute disease. It did show changes of mild underlying COPD.  --Patient has bilateral nephrostomy tubes in place, bilateral urethral stents, ileostomy.  He will continue with bilateral nephrostomy tube exchanges every 6 weeks. Will continue to be managed with a Foley catheter, decompression of the bladder and bilateral  nephrostomies.  --Patient instructed to return to clinic in 4 months at which time we will check his complete blood count, chemistries, iron studies, and a CEA.  --Regarding his iron Deficiency anemia, hemoglobin is 10.1 today and he is without symptoms of anemia, i.e.fatigue.  We will check the iron studies as noted above.  All questions were answered. The patient knows to call the clinic with any problems, questions or concerns. We can certainly see the patient much sooner if necessary.  I spent 15 minutes counseling the patient face to face. The total time spent in the appointment was 25 minutes.    Erielle Gawronski, MD 03/03/2013 4:30 AM

## 2013-03-02 NOTE — Telephone Encounter (Signed)
[facility] PT APPT SCHEDULE FOR MARCH AND MAY. PER 1/28 POF LB IN 6 WKS AND 4 MOS AND F/U IN 4 MOS.

## 2013-03-03 ENCOUNTER — Telehealth: Payer: Self-pay | Admitting: Internal Medicine

## 2013-03-03 ENCOUNTER — Telehealth: Payer: Self-pay

## 2013-03-03 ENCOUNTER — Other Ambulatory Visit: Payer: Self-pay | Admitting: Internal Medicine

## 2013-03-03 DIAGNOSIS — C19 Malignant neoplasm of rectosigmoid junction: Secondary | ICD-10-CM

## 2013-03-03 NOTE — Telephone Encounter (Signed)
Patient aware of pet scan.  We will schedule it.

## 2013-03-03 NOTE — Telephone Encounter (Signed)
S/w shannon at Dr Estell Harpin office [facility]. Dr Penelope Coop is suggesting a PET with contrast. Pt is not suitable for colonoscopy or BE because of colovesicle fistula. Dr Juliann Mule is in agreement and will order the PET.

## 2013-03-13 ENCOUNTER — Encounter (HOSPITAL_BASED_OUTPATIENT_CLINIC_OR_DEPARTMENT_OTHER): Payer: Self-pay | Admitting: Urology

## 2013-03-15 ENCOUNTER — Telehealth: Payer: Self-pay | Admitting: Medical Oncology

## 2013-03-15 NOTE — Telephone Encounter (Signed)
Pt called and states he received a letter from his insurance company denying his PET scan. I informed him that I would speak with Vaughan Basta in managed care to see what we need to do. I spoke with Vaughan Basta and she states that Dr. Juliann Mule will have to do a peer to peer to get it approved. I will contact pt if this is approved.

## 2013-03-15 NOTE — Telephone Encounter (Signed)
I called pt to inform him that Dr. Juliann Mule was able to get approval for his PET. It will Friday 03/18/13 and he needs to arrive at 12:45 for a 1:00 PET. I instructed him not to eat or drink after midnight. He voiced understanding.

## 2013-03-18 ENCOUNTER — Telehealth: Payer: Self-pay | Admitting: Internal Medicine

## 2013-03-18 ENCOUNTER — Ambulatory Visit (HOSPITAL_COMMUNITY): Payer: BC Managed Care – PPO

## 2013-03-18 ENCOUNTER — Ambulatory Visit (HOSPITAL_COMMUNITY)
Admission: RE | Admit: 2013-03-18 | Discharge: 2013-03-18 | Disposition: A | Discharge status: Home / Self Care | Payer: BC Managed Care – PPO | Source: Ambulatory Visit | Attending: Internal Medicine | Admitting: Internal Medicine

## 2013-03-18 DIAGNOSIS — C189 Malignant neoplasm of colon, unspecified: Secondary | ICD-10-CM | POA: Diagnosis not present

## 2013-03-18 DIAGNOSIS — Z9049 Acquired absence of other specified parts of digestive tract: Secondary | ICD-10-CM | POA: Diagnosis not present

## 2013-03-18 DIAGNOSIS — C19 Malignant neoplasm of rectosigmoid junction: Secondary | ICD-10-CM

## 2013-03-18 DIAGNOSIS — Z932 Ileostomy status: Secondary | ICD-10-CM | POA: Diagnosis not present

## 2013-03-18 LAB — GLUCOSE, CAPILLARY: Glucose-Capillary: 69 mg/dL — ABNORMAL LOW (ref 70–99)

## 2013-03-18 MED ORDER — FLUDEOXYGLUCOSE F - 18 (FDG) INJECTION
8.4000 | Freq: Once | INTRAVENOUS | Status: AC | PRN
  Administered 2013-03-18: 8.4 via INTRAVENOUS

## 2013-03-18 NOTE — Telephone Encounter (Signed)
Patient aware that PET Scan from today showed no evidence of local recurrence or metastatic disease.

## 2013-03-21 DIAGNOSIS — N329 Bladder disorder, unspecified: Secondary | ICD-10-CM | POA: Diagnosis not present

## 2013-04-12 ENCOUNTER — Other Ambulatory Visit: Payer: Self-pay | Admitting: Urology

## 2013-04-12 ENCOUNTER — Ambulatory Visit (HOSPITAL_COMMUNITY)
Admission: RE | Admit: 2013-04-12 | Discharge: 2013-04-12 | Disposition: A | Discharge status: Home / Self Care | Payer: BC Managed Care – PPO | Source: Ambulatory Visit | Attending: Urology | Admitting: Urology

## 2013-04-12 DIAGNOSIS — N1330 Unspecified hydronephrosis: Secondary | ICD-10-CM

## 2013-04-12 DIAGNOSIS — C189 Malignant neoplasm of colon, unspecified: Secondary | ICD-10-CM | POA: Diagnosis not present

## 2013-04-12 DIAGNOSIS — Z436 Encounter for attention to other artificial openings of urinary tract: Secondary | ICD-10-CM | POA: Insufficient documentation

## 2013-04-12 MED ORDER — IOHEXOL 300 MG/ML  SOLN
20.0000 mL | Freq: Once | INTRAMUSCULAR | Status: AC | PRN
  Administered 2013-04-12: 20 mL

## 2013-04-12 MED ORDER — LIDOCAINE HCL 1 % IJ SOLN
INTRAMUSCULAR | Status: AC
  Filled 2013-04-12: qty 20

## 2013-04-12 NOTE — Procedures (Signed)
Bilat percutaneous nephrostomy exchange under fluoro No complication No blood loss. See complete dictation in [facility].

## 2013-04-13 ENCOUNTER — Other Ambulatory Visit (HOSPITAL_BASED_OUTPATIENT_CLINIC_OR_DEPARTMENT_OTHER): Payer: BC Managed Care – PPO

## 2013-04-13 DIAGNOSIS — Z85038 Personal history of other malignant neoplasm of large intestine: Secondary | ICD-10-CM

## 2013-04-13 DIAGNOSIS — C19 Malignant neoplasm of rectosigmoid junction: Secondary | ICD-10-CM

## 2013-04-13 DIAGNOSIS — D509 Iron deficiency anemia, unspecified: Secondary | ICD-10-CM | POA: Diagnosis not present

## 2013-04-13 LAB — CBC WITH DIFFERENTIAL/PLATELET
BASO%: 0.6 % (ref 0.0–2.0)
Basophils Absolute: 0 10*3/uL (ref 0.0–0.1)
EOS ABS: 0.2 10*3/uL (ref 0.0–0.5)
EOS%: 4.7 % (ref 0.0–7.0)
HCT: 32 % — ABNORMAL LOW (ref 38.4–49.9)
HGB: 10.5 g/dL — ABNORMAL LOW (ref 13.0–17.1)
LYMPH%: 8 % — AB (ref 14.0–49.0)
MCH: 30.7 pg (ref 27.2–33.4)
MCHC: 32.9 g/dL (ref 32.0–36.0)
MCV: 93.4 fL (ref 79.3–98.0)
MONO#: 0.5 10*3/uL (ref 0.1–0.9)
MONO%: 10.3 % (ref 0.0–14.0)
NEUT%: 76.4 % — ABNORMAL HIGH (ref 39.0–75.0)
NEUTROS ABS: 3.9 10*3/uL (ref 1.5–6.5)
PLATELETS: 265 10*3/uL (ref 140–400)
RBC: 3.42 10*6/uL — AB (ref 4.20–5.82)
RDW: 15.4 % — ABNORMAL HIGH (ref 11.0–14.6)
WBC: 5.1 10*3/uL (ref 4.0–10.3)
lymph#: 0.4 10*3/uL — ABNORMAL LOW (ref 0.9–3.3)

## 2013-04-13 LAB — FERRITIN CHCC: FERRITIN: 61 ng/mL (ref 22–316)

## 2013-04-13 LAB — IRON AND TIBC CHCC
%SAT: 18 % — AB (ref 20–55)
IRON: 53 ug/dL (ref 42–163)
TIBC: 296 ug/dL (ref 202–409)
UIBC: 243 ug/dL (ref 117–376)

## 2013-05-23 ENCOUNTER — Other Ambulatory Visit: Payer: Self-pay | Admitting: Radiology

## 2013-05-24 ENCOUNTER — Other Ambulatory Visit: Payer: Self-pay | Admitting: Urology

## 2013-05-24 ENCOUNTER — Ambulatory Visit (HOSPITAL_COMMUNITY)
Admission: RE | Admit: 2013-05-24 | Discharge: 2013-05-24 | Disposition: A | Discharge status: Home / Self Care | Payer: BC Managed Care – PPO | Source: Ambulatory Visit | Attending: Urology | Admitting: Urology

## 2013-05-24 DIAGNOSIS — C189 Malignant neoplasm of colon, unspecified: Secondary | ICD-10-CM | POA: Insufficient documentation

## 2013-05-24 DIAGNOSIS — Z436 Encounter for attention to other artificial openings of urinary tract: Secondary | ICD-10-CM | POA: Insufficient documentation

## 2013-05-24 DIAGNOSIS — N1330 Unspecified hydronephrosis: Secondary | ICD-10-CM

## 2013-05-24 MED ORDER — LIDOCAINE HCL 1 % IJ SOLN
INTRAMUSCULAR | Status: AC
  Filled 2013-05-24: qty 20

## 2013-05-24 MED ORDER — IOHEXOL 300 MG/ML  SOLN
10.0000 mL | Freq: Once | INTRAMUSCULAR | Status: AC | PRN
  Administered 2013-05-24: 15 mL

## 2013-06-29 ENCOUNTER — Telehealth: Payer: Self-pay | Admitting: Internal Medicine

## 2013-06-29 ENCOUNTER — Other Ambulatory Visit (HOSPITAL_BASED_OUTPATIENT_CLINIC_OR_DEPARTMENT_OTHER): Payer: BC Managed Care – PPO

## 2013-06-29 ENCOUNTER — Ambulatory Visit (HOSPITAL_BASED_OUTPATIENT_CLINIC_OR_DEPARTMENT_OTHER): Payer: BC Managed Care – PPO | Admitting: Internal Medicine

## 2013-06-29 VITALS — BP 132/67 | HR 62 | Temp 97.5°F | Resp 18 | Ht 69.0 in | Wt 158.5 lb

## 2013-06-29 DIAGNOSIS — D509 Iron deficiency anemia, unspecified: Secondary | ICD-10-CM

## 2013-06-29 DIAGNOSIS — C19 Malignant neoplasm of rectosigmoid junction: Secondary | ICD-10-CM

## 2013-06-29 DIAGNOSIS — N135 Crossing vessel and stricture of ureter without hydronephrosis: Secondary | ICD-10-CM | POA: Diagnosis not present

## 2013-06-29 DIAGNOSIS — Z85038 Personal history of other malignant neoplasm of large intestine: Secondary | ICD-10-CM

## 2013-06-29 LAB — CBC WITH DIFFERENTIAL/PLATELET
BASO%: 0.5 % (ref 0.0–2.0)
BASOS ABS: 0 10*3/uL (ref 0.0–0.1)
EOS%: 5.1 % (ref 0.0–7.0)
Eosinophils Absolute: 0.2 10*3/uL (ref 0.0–0.5)
HEMATOCRIT: 32.2 % — AB (ref 38.4–49.9)
HEMOGLOBIN: 10.7 g/dL — AB (ref 13.0–17.1)
LYMPH%: 10.7 % — ABNORMAL LOW (ref 14.0–49.0)
MCH: 30.4 pg (ref 27.2–33.4)
MCHC: 33.2 g/dL (ref 32.0–36.0)
MCV: 91.5 fL (ref 79.3–98.0)
MONO#: 0.3 10*3/uL (ref 0.1–0.9)
MONO%: 6.3 % (ref 0.0–14.0)
NEUT#: 3.3 10*3/uL (ref 1.5–6.5)
NEUT%: 77.4 % — AB (ref 39.0–75.0)
Platelets: 204 10*3/uL (ref 140–400)
RBC: 3.52 10*6/uL — ABNORMAL LOW (ref 4.20–5.82)
RDW: 14.4 % (ref 11.0–14.6)
WBC: 4.3 10*3/uL (ref 4.0–10.3)
lymph#: 0.5 10*3/uL — ABNORMAL LOW (ref 0.9–3.3)

## 2013-06-29 LAB — COMPREHENSIVE METABOLIC PANEL (CC13)
ALT: 15 U/L (ref 0–55)
AST: 14 U/L (ref 5–34)
Albumin: 3.6 g/dL (ref 3.5–5.0)
Alkaline Phosphatase: 59 U/L (ref 40–150)
Anion Gap: 10 mEq/L (ref 3–11)
BILIRUBIN TOTAL: 0.3 mg/dL (ref 0.20–1.20)
BUN: 45.7 mg/dL — ABNORMAL HIGH (ref 7.0–26.0)
CHLORIDE: 116 meq/L — AB (ref 98–109)
CO2: 19 mEq/L — ABNORMAL LOW (ref 22–29)
CREATININE: 2.9 mg/dL — AB (ref 0.7–1.3)
Calcium: 9.4 mg/dL (ref 8.4–10.4)
Glucose: 96 mg/dl (ref 70–140)
Potassium: 4 mEq/L (ref 3.5–5.1)
Sodium: 145 mEq/L (ref 136–145)
Total Protein: 6.5 g/dL (ref 6.4–8.3)

## 2013-06-29 LAB — IRON AND TIBC CHCC
%SAT: 18 % — AB (ref 20–55)
Iron: 54 ug/dL (ref 42–163)
TIBC: 294 ug/dL (ref 202–409)
UIBC: 240 ug/dL (ref 117–376)

## 2013-06-29 LAB — FERRITIN CHCC: Ferritin: 37 ng/ml (ref 22–316)

## 2013-06-29 LAB — CEA: CEA: 2 ng/mL (ref 0.0–5.0)

## 2013-06-29 NOTE — Telephone Encounter (Signed)
, °

## 2013-06-29 NOTE — Progress Notes (Signed)
[name], MD Eric Munoz [place] [address]  DIAGNOSIS: Rectosigmoid cancer, stage IV - Plan: CBC with Differential, Comprehensive metabolic panel (Cmet) - [facility], Lactate dehydrogenase (LDH) - [facility], CEA, Ferritin, Iron and TIBC [facility]  Ureteral stricture, left  Chief Complaint  Patient presents with  . Follow-up    CURRENT THERAPY:  Observation.  INTERVAL HISTORY: Eric Munoz 73 y.o. 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 He was last seen by me on 03/02/2013/2014. His last PET scan was on 03/18/2013 which showed no evidence of cancer.  Today he is without complaints.  Continues to have his nephrostomy tubes changed every 6 weeks (next change is due next week). Denies any recent hospitalizations or emergency room visits. He denies any fevers or chills or acute shortness of breath. He also denies trouble with fatigue, anorexia, dyspnea.  He has also seen his nephrologist without changes in his medications. He denies weight lost.   MEDICAL HISTORY: Past Medical History  Diagnosis Date  . Urinoma, s/p bladder repair   . History of acute renal failure nov 2012  . Colovesical fistula   . S/P ileostomy     12-20-2010  . Foley catheter in place   . Difficult intubation NOTED ASA IV W/ MAY 2013 AT [facility] MAIN OR--  INTUBATED OVER BOUGIE  BY DR FORTUNE    PT DID OK W/ LMA AT [facility] 10-24-2011  -- DR ROSE  . Nephrostomy status     CURRENTLY HAS BILATERAL TUBES  . History of colon cancer, stage IV     2007 w/ recurrence july 2011--  RECTOSIGMOID [id number] s/p left coloectomy/ lar--  chemo complete feb 2012;  rxt complete jan 2012----   . Iron deficiency anemia   . Rectosigmoid cancer     stage iv--  no recurrent mestastatic disease--  oncologist--  dr Eric Munoz ([facility])  .  Hydronephrosis, bilateral     Status post surgical intervention  W/ URETERAL STENTS  . Chronic kidney disease (CKD), stage III (moderate) SECONDARY UNCLEAR ETIOLOGY, POSSIBLE RELATED RADIATION THERAPY      NEPHROLOGIST,  DR Eric Munoz  . Acute-on-chronic kidney injury     SECONDARY TO OBSTRUCTIVE UROPATHY--  BILATERAL URETERAL STENTS, BILATERAL NEPHROSTOMY TUBES AND FOLEY CATHETER    INTERIM HISTORY: has History of adenomatous polyp of rectum s/p low LAR; History of colon cancer, stage IV, Stage 4, [id number] (Chem last Feb12/ Radiation-last Jan12); Ureteral stricture, left; Loop diverting ileostomy, [id number]; Colourethral fistula ; Renal insufficiency; Unspecified deficiency anemia; Tachycardia; UTI (lower urinary tract infection); Acute renal failure in the setting of indwelling ureteral stents and bilateral hydronephrosis; Hyperkalemia; Metabolic acidosis; Normocytic anemia; Rectosigmoid cancer, stage IV; Pyonephrosis; and Hypokalemia on his problem list.    ALLERGIES:  has No Known Allergies.  MEDICATIONS: has a current medication list which includes the following prescription(s): multivitamin with minerals and feeding supplement.  SURGICAL HISTORY:  Past Surgical History  Procedure Laterality Date  . Portacath placement  09-17-2009  . Colostomy takedown  12/20/2010    Procedure: LAPAROSCOPIC COLOSTOMY TAKEDOWN;  Surgeon: Adin Hector, MD;  Location: [facility];  Service: General;  Laterality: N/A;  Laparoscopy Colostomy Takedown With Ileostomy  . Port-a-cath removal  12/20/2010    Procedure: REMOVAL PORT-A-CATH;  Surgeon: Adin Hector, MD;  Location: [facility];  Service: General;  Laterality: Right;  Right  venous catheter no longer needed.  . Cystoscopy w/ ureteral stent placement  01/13/2011    Procedure: CYSTOSCOPY WITH RETROGRADE PYELOGRAM/URETERAL STENT PLACEMENT;  Surgeon: Franchot Gallo;  Location: [facility];  Service: Urology;  Laterality: Left;  intra-operative cystogram  .  Tonsillectomy  yrs ago  . Fracture surgery  1950'S    RIGHT ARM SURGERY FOR FX  . Examination under anesthesia  06/19/2011    Procedure: EXAM UNDER ANESTHESIA;  Surgeon: Adin Hector, MD;  Location: [facility];  Service: General;;  . Cystoscopy w/ ureteral stent placement  06/19/2011    Procedure: CYSTOSCOPY WITH RETROGRADE PYELOGRAM/URETERAL STENT PLACEMENT;  Surgeon: Franchot Gallo, MD;  Location: [facility];  Service: Urology;  Laterality: Bilateral;  Cystoscopy/Bilateral Double J Stent insertion , flexible cystoscopy   . Cystoscopy w/ ureteral stent placement  10/24/2011    Procedure: CYSTOSCOPY WITH STENT REPLACEMENT;  Surgeon: Franchot Gallo, MD;  Location[facility];  Service: Urology;  Laterality: Bilateral;  45 mins requested for this case   . Cystoscopy w/ retrogrades  10/24/2011    Procedure: CYSTOSCOPY WITH RETROGRADE PYELOGRAM;  Surgeon: Franchot Gallo, MD;  Location: [facility]  Service: Urology;  Laterality: Left;  . Transanal resection of rectal polyp and sigmoid colectomy  03/15/2004  . Left hydrocele surgery  03/16/2009  . Cystoscopy w/ ureteral stent placement Bilateral 04/09/2012    Procedure: CYSTOSCOPY WITH bilateral  STENT REPLACEMENT;  Surgeon: Franchot Gallo, MD;  Location: [facility]  Service: Urology;  Laterality: Bilateral;  . Cysto/ left retrograde pyelogram/ left stent placement  03-01-2010  &  09-13-2010  . Multiple nephrostomy tube changes  every six weeks  . Colon surgery  08/07/09    Extensive lysis adhesions/ Re-do Low Anterior Rescection, Reconstruction left external iliac artery/ Left ureterolysis and ureter disposition  . Cystoscopy w/ ureteral stent placement Bilateral 12/13/2012    Procedure: CYSTOSCOPY WITH STENT REPLACEMENT;  Surgeon: Franchot Gallo, MD;  Location: [facility];  Service: Urology;  Laterality: Bilateral;  . Cystoscopy with ureteroscopy and stent placement Bilateral  02/18/2013    Procedure: CYSTOSCOPY WITH URETEROSCOPY AND BILATERAL STENT EXCHANGE;  Surgeon: Franchot Gallo, MD;  Location: [facility];  Service: Urology;  Laterality: Bilateral;   PROBLEM LIST: 1. Adenocarcinoma of the rectosigmoid stage IVA, T4b N2a M1a cancer involving with involvement of periaortic lymph node. Diagnosis was established in July of 2011. The patient underwent rather extensive surgery on both July 5th and August 08, 2009. There were multiple tumor nodules. Six out of 26 lymph nodes were involved along with adjacent small bowel. There was involvement of a periaortic lymph node. There was a close radial margin. There was tumor thrombus in a vein. There was extracapsular extension, lymphovascular and perineural invasion, invasion of tumor into the left external iliac artery and left ureter. KRAS mutation was not detected, and thus the tumor is KRAS wild type. Surgery involved a colostomy and placement of a left ureteral stent. There was a question of whether this represented a de novo cancer or possibly recurrence from a previous sigmoid well-differentiated adenocarcinoma that was resected by Dr. Georganna Skeans on 03/15/2004. This tumor arose apparently in a polyp with 0/3 lymph nodes and was felt to be T1 stage I. The margin, however, was 0.5 mm. After the patient's surgery in July 2011, he received 7 cycles of FOLFOX from 09/26/2009 through 12/24/2009. The patient received pelvic radiation with continuous infusion  5-fluorouracil from 01/07/2010 through 02/15/2009. The patient then received 3 cycles of 5-FU leucovorin and 5-FU by continuous infusion from 03/04/2010 through 04/01/2010. 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 PET scan carried out on 06/16/2011 showed no evidence of recurrent or metastatic disease.  2. Ileostomy, 12/20/2010.  3. Right leg weakness secondary to femoral nerve injuries following  the patient's surgery in July 2011. Currently  resolved.  4. The patient underwent extensive surgery on December 20, 2010. This consisted of lysis of adhesions, laparoscopic-assisted splenic flexure mobilization, laparoscopic colostomy takedown, primary repair of bladder injury, diverting loop ileostomy in the right upper quadrant, and removal of the patient's Port-A-Cath.  5. Bilateral ureteral stents, most recently exchanged this month on 03/01/2013 by Dr. Diona Fanti.  6. 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  7. Progressive weight loss.  8. Anemia noted in January 2013 with a prior history of iron  deficiency anemia requiring intravenous Feraheme 510 mg  on 09/26/2009 and on 10/10/2009. Renal insufficiency is probably contributing to this patient's anemia at the present time.  9. Development of renal insufficiency noted in May 2013 with acute decompensation noted on 11/04/2011.  10. Bilateral percutaneous nephrostomies carried out on January 10, 2012 for  bilateral hydronephrosis. These tubes were removed in April 2014 but  needed to be reinserted on 06/15/2012 because of renal decompensation.   REVIEW OF SYSTEMS:   Constitutional: Denies fevers, chills or abnormal weight loss Eyes: Denies blurriness of vision Ears, nose, mouth, throat, and face: Denies mucositis or sore throat Respiratory: Denies cough, dyspnea or wheezes Cardiovascular: Denies palpitation, chest discomfort or lower extremity swelling Gastrointestinal:  Denies nausea, heartburn or change in bowel habits Skin: Denies abnormal skin rashes Lymphatics: Denies new lymphadenopathy or easy bruising Neurological:Denies numbness, tingling or new weaknesses Behavioral/Psych: Mood is stable, no new changes  All other systems were reviewed with the patient and are negative.  PHYSICAL EXAMINATION: ECOG PERFORMANCE STATUS: 0 - Asymptomatic  Blood pressure 132/67, pulse 62, temperature 97.5 F (36.4 C), temperature source Oral, resp. rate 18, height _0  (1.753  m), weight 158 lb 8 oz (71.895 kg).  GENERAL:alert, no distress and comfortable thin middle-aged male SKIN: skin color, texture, turgor are normal, no rashes or significant lesions EYES: normal, Conjunctiva are pink and non-injected, sclera clear OROPHARYNX:no exudate, no erythema and lips, buccal mucosa, and tongue normal  NECK: supple, thyroid normal size, non-tender, without nodularity LYMPH:  no palpable lymphadenopathy in the cervical, axillary or supraclavicular LUNGS: clear to auscultation and percussion with normal breathing effort HEART: regular rate & rhythm and no murmurs and one plus pretibial edema (left greater than right) ABDOMEN:abdomen soft, non-tender and normal bowel sounds; ileostomy bag in right mid abdomen; he does also have bilateral nephrostomy tubes in place. Also has a Foley catheter with urine that is clear. Musculoskeletal:no cyanosis of digits and no clubbing  NEURO: alert & oriented x 3 with fluent speech, no focal motor/sensory deficits   LABORATORY DATA: Results for orders placed in visit on 06/29/13 (from the past 48 hour(s))  COMPREHENSIVE METABOLIC PANEL (VE93)     Status: Abnormal   Collection Time    06/29/13 10:08 AM      Result Value Ref Range   Sodium 145  136 - 145 mEq/L   Potassium 4.0  3.5 - 5.1 mEq/L   Chloride 116 (*) 98 - 109 mEq/L   CO2 19 (*) 22 - 29 mEq/L   Glucose 96  70 - 140 mg/dl   BUN 45.7 (*)  7.0 - 26.0 mg/dL   Creatinine 2.9 (*) 0.7 - 1.3 mg/dL   Total Bilirubin 0.30  0.20 - 1.20 mg/dL   Alkaline Phosphatase 59  40 - 150 U/L   AST 14  5 - 34 U/L   ALT 15  0 - 55 U/L   Total Protein 6.5  6.4 - 8.3 g/dL   Albumin 3.6  3.5 - 5.0 g/dL   Calcium 9.4  8.4 - 10.4 mg/dL   Anion Gap 10  3 - 11 mEq/L  CBC WITH DIFFERENTIAL     Status: Abnormal   Collection Time    06/29/13 10:08 AM      Result Value Ref Range   WBC 4.3  4.0 - 10.3 10e3/uL   NEUT# 3.3  1.5 - 6.5 10e3/uL   HGB 10.7 (*) 13.0 - 17.1 g/dL   HCT 32.2 (*) 38.4 - 49.9 %    Platelets 204  140 - 400 10e3/uL   MCV 91.5  79.3 - 98.0 fL   MCH 30.4  27.2 - 33.4 pg   MCHC 33.2  32.0 - 36.0 g/dL   RBC 3.52 (*) 4.20 - 5.82 10e6/uL   RDW 14.4  11.0 - 14.6 %   lymph# 0.5 (*) 0.9 - 3.3 10e3/uL   MONO# 0.3  0.1 - 0.9 10e3/uL   Eosinophils Absolute 0.2  0.0 - 0.5 10e3/uL   Basophils Absolute 0.0  0.0 - 0.1 10e3/uL   NEUT% 77.4 (*) 39.0 - 75.0 %   LYMPH% 10.7 (*) 14.0 - 49.0 %   MONO% 6.3  0.0 - 14.0 %   EOS% 5.1  0.0 - 7.0 %   BASO% 0.5  0.0 - 2.0 %  FERRITIN [facility]     Status: None   Collection Time    06/29/13 10:08 AM      Result Value Ref Range   Ferritin 37  22 - 316 ng/ml  IRON AND TIBC [facility]     Status: Abnormal   Collection Time    06/29/13 10:08 AM      Result Value Ref Range   Iron 54  42 - 163 ug/dL   TIBC 294  202 - 409 ug/dL   UIBC 240  117 - 376 ug/dL   %SAT 18 (*) 20 - 55 %       Labs:  Lab Results  Component Value Date   WBC 4.3 06/29/2013   HGB 10.7* 06/29/2013   HCT 32.2* 06/29/2013   MCV 91.5 06/29/2013   PLT 204 06/29/2013   NEUTROABS 3.3 06/29/2013      Chemistry      Component Value Date/Time   NA 145 06/29/2013 1008   NA 145 02/18/2013 1124   K 4.0 06/29/2013 1008   K 4.3 02/18/2013 1124   CL 115* 02/18/2013 1124   CL 113* 07/01/2012 1427   CO2 19* 06/29/2013 1008   CO2 12* 06/15/2012 0925   BUN 45.7* 06/29/2013 1008   BUN 46* 02/18/2013 1124   CREATININE 2.9* 06/29/2013 1008   CREATININE 3.30* 02/18/2013 1124   CREATININE 2.04* 01/08/2011 1540      Component Value Date/Time   CALCIUM 9.4 06/29/2013 1008   CALCIUM 9.4 06/15/2012 0925   ALKPHOS 59 06/29/2013 1008   ALKPHOS 114 06/11/2012 1725   AST 14 06/29/2013 1008   AST 15 06/11/2012 1725   ALT 15 06/29/2013 1008   ALT 33 06/11/2012 1725   BILITOT 0.30 06/29/2013 1008   BILITOT 0.2* 06/11/2012 1725  Results for Eric Munoz, Eric Munoz (MRN [id number]) as of 06/29/2013 12:54  Ref. Range 03/05/2012 15:46 07/01/2012 14:27 11/02/2012 11:03 03/02/2013 10:18  CEA Latest Range: 0.0-5.0 ng/mL 1.3 1.4  1.2 1.7   Iron/TIBC/Ferritin    Component Value Date/Time   IRON 54 06/29/2013 1008   IRON 47 07/01/2012 1427   TIBC 294 06/29/2013 1008   TIBC 260 07/01/2012 1427   FERRITIN 37 06/29/2013 1008   FERRITIN 176 07/01/2012 1427   RADIOGRAPHIC STUDIES: PET 03/18/2013 NUCLEAR MEDICINE PET SKULL BASE TO THIGH FASTING BLOOD GLUCOSE: Value: 69 mg/dl  TECHNIQUE: 8.4 mCi F-18 FDG was injected intravenously. Full-ring PET imaging was performed from the skull base to thigh after the radiotracer. CT data was obtained and used for attenuation correction and anatomic localization. COMPARISON: SP US GUIDANCE dated 06/15/2012; NM PET IMAGE RESTAG  (PS) SKULL BASE TO THIGH dated 06/16/2011; NM PET IMAGE RESTAG (PS) SKULL BASE TO THIGH dated 11/29/2010 FINDINGS: NECK No hypermetabolic cervical lymph nodes are identified.There are no  lesions of the pharyngeal mucosal space. There is stable nodularity of the thyroid gland without associated abnormal metabolic activity. CHEST There are no hypermetabolic mediastinal, hilar or axillary lymph nodes. There is no hypermetabolic pulmonary activity. The lungs are clear. Mild emphysematous changes are present. ABDOMEN/PELVIS There is no hypermetabolic activity within the liver, adrenal  glands, spleen or pancreas. There is no hypermetabolic nodal activity. Patient is status post subtotal colectomy and ileostomy. There are bilateral percutaneous nephroureteral catheters. The distal ends of both catheters are within the bladder, although the distal left pigtail loop is unformed. The bladder is decompressed by a Foley catheter. There is activity within the ureters bilaterally which remain mildly dilated, but stable. The prostate gland appears grossly stable. The presacral soft tissue process has partially contracted and demonstrates no abnormal metabolic activity. SKELETON There is no hypermetabolic activity to suggest osseous metastatic disease. IMPRESSION: 1. No evidence of local  recurrence or metastatic disease. 2. There is no abnormal activity within the presacral fibrotic  process. 3. Bilateral nephroureteral catheters remain in place. The distal end of the left pigtail catheter is straightened within the bladder.  ASSESSMENT: Eric Munoz 73 y.o. male with a history of Rectosigmoid cancer, stage IV - Plan: CBC with Differential, Comprehensive metabolic panel (Cmet) - [facility], Lactate dehydrogenase (LDH) - [facility], CEA, Ferritin, Iron and TIBC [facility]  Ureteral stricture, left   PLAN:  --Eric Munoz continues to do well without evidence of recurrent, mestastatic disease.  We reviewed his laboratories and scans including his PET scan done on 03/19/2011.  There was no hypermetabolic activity to suggest local recurrence or metastatic disease.   --Patient has bilateral nephrostomy tubes in place, bilateral urethral stents, ileostomy.  He will continue with bilateral nephrostomy tube exchanges every 6 weeks. Will continue to be managed with a Foley catheter, decompression of the bladder and bilateral nephrostomies.  --Patient instructed to return to clinic in 4 months at which time we will check his complete blood count, chemistries, iron studies, and a CEA.  --Regarding his iron Deficiency anemia, hemoglobin is 10.7 today and he is without symptoms of anemia, i.e.fatigue.  Iron studies are stable with a ferritin of 37.  Might require iron next visit.   All questions were answered. The patient knows to call the clinic with any problems, questions or concerns. We can certainly see the patient much sooner if necessary.  I spent 15 minutes counseling the patient face to face. The total time spent in the appointment was  25 minutes.    Concha Norway, MD 06/29/2013 12:59 PM

## 2013-07-05 ENCOUNTER — Ambulatory Visit (HOSPITAL_COMMUNITY)
Admission: RE | Admit: 2013-07-05 | Discharge: 2013-07-05 | Disposition: A | Discharge status: Home / Self Care | Payer: BC Managed Care – PPO | Source: Ambulatory Visit | Attending: Urology | Admitting: Urology

## 2013-07-05 ENCOUNTER — Other Ambulatory Visit: Payer: Self-pay | Admitting: Urology

## 2013-07-05 DIAGNOSIS — N1330 Unspecified hydronephrosis: Secondary | ICD-10-CM

## 2013-07-05 DIAGNOSIS — Z436 Encounter for attention to other artificial openings of urinary tract: Secondary | ICD-10-CM | POA: Insufficient documentation

## 2013-07-05 MED ORDER — LIDOCAINE HCL 1 % IJ SOLN
INTRAMUSCULAR | Status: AC
  Filled 2013-07-05: qty 20

## 2013-07-05 MED ORDER — IOHEXOL 300 MG/ML  SOLN
20.0000 mL | Freq: Once | INTRAMUSCULAR | Status: AC | PRN
  Administered 2013-07-05: 20 mL

## 2013-07-05 NOTE — Procedures (Signed)
Successful bilateral PCN exchange.  No immediate complications.  

## 2013-08-16 ENCOUNTER — Other Ambulatory Visit (HOSPITAL_COMMUNITY): Payer: BC Managed Care – PPO

## 2013-08-18 ENCOUNTER — Other Ambulatory Visit: Payer: Self-pay | Admitting: Urology

## 2013-08-18 ENCOUNTER — Ambulatory Visit (HOSPITAL_COMMUNITY)
Admission: RE | Admit: 2013-08-18 | Discharge: 2013-08-18 | Disposition: A | Discharge status: Home / Self Care | Payer: BC Managed Care – PPO | Source: Ambulatory Visit | Attending: Urology | Admitting: Urology

## 2013-08-18 DIAGNOSIS — Z85038 Personal history of other malignant neoplasm of large intestine: Secondary | ICD-10-CM | POA: Insufficient documentation

## 2013-08-18 DIAGNOSIS — N139 Obstructive and reflux uropathy, unspecified: Secondary | ICD-10-CM | POA: Insufficient documentation

## 2013-08-18 DIAGNOSIS — N1330 Unspecified hydronephrosis: Secondary | ICD-10-CM

## 2013-08-18 DIAGNOSIS — Z436 Encounter for attention to other artificial openings of urinary tract: Secondary | ICD-10-CM | POA: Insufficient documentation

## 2013-08-18 DIAGNOSIS — N135 Crossing vessel and stricture of ureter without hydronephrosis: Secondary | ICD-10-CM | POA: Diagnosis not present

## 2013-08-18 MED ORDER — IOHEXOL 300 MG/ML  SOLN
10.0000 mL | Freq: Once | INTRAMUSCULAR | Status: AC | PRN
Start: 2013-08-18 — End: 2013-08-18
  Administered 2013-08-18: 10 mL

## 2013-08-18 MED ORDER — LIDOCAINE HCL 1 % IJ SOLN
INTRAMUSCULAR | Status: AC
  Filled 2013-08-18: qty 20

## 2013-08-18 NOTE — Procedures (Signed)
Interventional Radiology Procedure Note  Procedure: Routine exchange of bilateral 52Y PCNs Complications: None Recommendations: - Return to IR in 6 weeks for next check and change  Signed,  Criselda Peaches, MD [facility] Radiology

## 2013-09-28 ENCOUNTER — Other Ambulatory Visit: Payer: Self-pay | Admitting: Urology

## 2013-09-28 DIAGNOSIS — N1330 Unspecified hydronephrosis: Secondary | ICD-10-CM

## 2013-09-29 ENCOUNTER — Other Ambulatory Visit: Payer: Self-pay | Admitting: Urology

## 2013-09-29 ENCOUNTER — Ambulatory Visit (HOSPITAL_COMMUNITY)
Admission: RE | Admit: 2013-09-29 | Discharge: 2013-09-29 | Disposition: A | Discharge status: Home / Self Care | Payer: Medicare Other | Source: Ambulatory Visit | Attending: Urology | Admitting: Urology

## 2013-09-29 DIAGNOSIS — C189 Malignant neoplasm of colon, unspecified: Secondary | ICD-10-CM | POA: Insufficient documentation

## 2013-09-29 DIAGNOSIS — Z436 Encounter for attention to other artificial openings of urinary tract: Secondary | ICD-10-CM | POA: Insufficient documentation

## 2013-09-29 DIAGNOSIS — N1330 Unspecified hydronephrosis: Secondary | ICD-10-CM

## 2013-09-29 MED ORDER — IOHEXOL 300 MG/ML  SOLN
25.0000 mL | Freq: Once | INTRAMUSCULAR | Status: AC | PRN
  Administered 2013-09-29: 15 mL

## 2013-09-29 MED ORDER — LIDOCAINE HCL 1 % IJ SOLN
INTRAMUSCULAR | Status: AC
  Filled 2013-09-29: qty 20

## 2013-10-17 DIAGNOSIS — N329 Bladder disorder, unspecified: Secondary | ICD-10-CM | POA: Diagnosis not present

## 2013-10-20 DIAGNOSIS — H40019 Open angle with borderline findings, low risk, unspecified eye: Secondary | ICD-10-CM | POA: Diagnosis not present

## 2013-10-27 DIAGNOSIS — N2581 Secondary hyperparathyroidism of renal origin: Secondary | ICD-10-CM | POA: Diagnosis not present

## 2013-10-27 DIAGNOSIS — N139 Obstructive and reflux uropathy, unspecified: Secondary | ICD-10-CM | POA: Diagnosis not present

## 2013-10-27 DIAGNOSIS — D649 Anemia, unspecified: Secondary | ICD-10-CM | POA: Diagnosis not present

## 2013-10-27 DIAGNOSIS — N179 Acute kidney failure, unspecified: Secondary | ICD-10-CM | POA: Diagnosis not present

## 2013-10-31 ENCOUNTER — Telehealth: Payer: Self-pay | Admitting: Hematology

## 2013-10-31 ENCOUNTER — Encounter: Payer: Self-pay | Admitting: Hematology

## 2013-10-31 ENCOUNTER — Other Ambulatory Visit (HOSPITAL_BASED_OUTPATIENT_CLINIC_OR_DEPARTMENT_OTHER): Payer: Medicare Other

## 2013-10-31 ENCOUNTER — Ambulatory Visit (HOSPITAL_BASED_OUTPATIENT_CLINIC_OR_DEPARTMENT_OTHER): Payer: Medicare Other | Admitting: Hematology

## 2013-10-31 VITALS — BP 123/74 | HR 63 | Temp 98.0°F | Resp 18 | Ht 69.0 in | Wt 160.9 lb

## 2013-10-31 DIAGNOSIS — C19 Malignant neoplasm of rectosigmoid junction: Secondary | ICD-10-CM

## 2013-10-31 DIAGNOSIS — C189 Malignant neoplasm of colon, unspecified: Secondary | ICD-10-CM

## 2013-10-31 LAB — CBC WITH DIFFERENTIAL/PLATELET
BASO%: 0.8 % (ref 0.0–2.0)
Basophils Absolute: 0 10e3/uL (ref 0.0–0.1)
EOS%: 5.5 % (ref 0.0–7.0)
Eosinophils Absolute: 0.3 10e3/uL (ref 0.0–0.5)
HCT: 33.7 % — ABNORMAL LOW (ref 38.4–49.9)
HGB: 10.9 g/dL — ABNORMAL LOW (ref 13.0–17.1)
LYMPH%: 6.2 % — ABNORMAL LOW (ref 14.0–49.0)
MCH: 30 pg (ref 27.2–33.4)
MCHC: 32.3 g/dL (ref 32.0–36.0)
MCV: 92.9 fL (ref 79.3–98.0)
MONO#: 0.5 10e3/uL (ref 0.1–0.9)
MONO%: 9.6 % (ref 0.0–14.0)
NEUT#: 3.9 10e3/uL (ref 1.5–6.5)
NEUT%: 77.9 % — ABNORMAL HIGH (ref 39.0–75.0)
Platelets: 250 10e3/uL (ref 140–400)
RBC: 3.63 10e6/uL — ABNORMAL LOW (ref 4.20–5.82)
RDW: 15 % — ABNORMAL HIGH (ref 11.0–14.6)
WBC: 5 10e3/uL (ref 4.0–10.3)
lymph#: 0.3 10e3/uL — ABNORMAL LOW (ref 0.9–3.3)

## 2013-10-31 LAB — IRON AND TIBC CHCC
%SAT: 21 % (ref 20–55)
Iron: 61 ug/dL (ref 42–163)
TIBC: 293 ug/dL (ref 202–409)
UIBC: 232 ug/dL (ref 117–376)

## 2013-10-31 LAB — COMPREHENSIVE METABOLIC PANEL (CC13)
ALT: 12 U/L (ref 0–55)
AST: 14 U/L (ref 5–34)
Albumin: 3.5 g/dL (ref 3.5–5.0)
Alkaline Phosphatase: 55 U/L (ref 40–150)
Anion Gap: 8 meq/L (ref 3–11)
BUN: 47.2 mg/dL — ABNORMAL HIGH (ref 7.0–26.0)
CO2: 20 meq/L — ABNORMAL LOW (ref 22–29)
Calcium: 9.6 mg/dL (ref 8.4–10.4)
Chloride: 115 meq/L — ABNORMAL HIGH (ref 98–109)
Creatinine: 2.9 mg/dL — ABNORMAL HIGH (ref 0.7–1.3)
Glucose: 76 mg/dL (ref 70–140)
Potassium: 4.3 meq/L (ref 3.5–5.1)
Sodium: 143 meq/L (ref 136–145)
Total Bilirubin: 0.24 mg/dL (ref 0.20–1.20)
Total Protein: 6.8 g/dL (ref 6.4–8.3)

## 2013-10-31 LAB — FERRITIN CHCC: FERRITIN: 49 ng/mL (ref 22–316)

## 2013-10-31 LAB — LACTATE DEHYDROGENASE (CC13): LDH: 110 U/L — ABNORMAL LOW (ref 125–245)

## 2013-10-31 LAB — CEA: CEA: 1.4 ng/mL (ref 0.0–5.0)

## 2013-10-31 NOTE — Telephone Encounter (Signed)
gv pt appt schedule for march 2016 °

## 2013-10-31 NOTE — Progress Notes (Signed)
[facility]COL[facility] OFF[facility] PROGRESS NOTE DATE OF VISIT: 10/31/2013  Tawanna Solo, MD [place] [id number]  DIAGNOSIS: Colon cancer - Plan: CBC with Differential, Comprehensive metabolic panel (Cmet) - [facility], [facility]  Chief Complaint  Patient presents with  . Follow-up    CURRENT THERAPY:  Observation.  INTERVAL HISTORY:  KELCEY WICKSTROM 73 y.o. 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 He was last seen by Dr Juliann Mule on 06/29/13 and today is first encounter with me. His last PET scan was on 03/18/2013 which showed no evidence of cancer.  Today he is without complaints.  Continues to have his nephrostomy tubes changed every 6 weeks (next change is due October). Denies any recent hospitalizations or emergency room visits. He denies any fevers or chills or acute shortness of breath. He also denies trouble with fatigue, anorexia, dyspnea.  He has also seen his nephrologist without changes in his medications. He denies weight lost.   MEDICAL HISTORY: Past Medical History  Diagnosis Date  . Urinoma, s/p bladder repair   . History of acute renal failure nov 2012  . Colovesical fistula   . S/P ileostomy     12-20-2010  . Foley catheter in place   . Difficult intubation NOTED ASA IV W/ MAY 2013 AT [facility] MAIN OR--  INTUBATED OVER BOUGIE  BY DR FORTUNE    PT DID OK W/ LMA AT [facility] 10-24-2011  -- DR ROSE  . Nephrostomy status     CURRENTLY HAS BILATERAL TUBES  . History of colon cancer, stage IV     2007 w/ recurrence july 2011--  RECTOSIGMOID [id number] s/p left coloectomy/ lar--  chemo complete feb 2012;  rxt complete jan 2012----   . Iron deficiency anemia   . Rectosigmoid cancer     stage iv--  no recurrent mestastatic disease--  oncologist--  dr Shanon Brow chism ([facility])  . Hydronephrosis, bilateral     Status  post surgical intervention  W/ URETERAL STENTS  . Chronic kidney disease (CKD), stage III (moderate) SECONDARY UNCLEAR ETIOLOGY, POSSIBLE RELATED RADIATION THERAPY      NEPHROLOGIST,  DR Marval Regal  . Acute-on-chronic kidney injury     SECONDARY TO OBSTRUCTIVE UROPATHY--  BILATERAL URETERAL STENTS, BILATERAL NEPHROSTOMY TUBES AND FOLEY CATHETER    INTERIM HISTORY: has History of adenomatous polyp of rectum s/p low LAR; History of colon cancer, stage IV, Stage 4, [id number] (Chem last Feb12/ Radiation-last Jan12); Ureteral stricture, left; Loop diverting ileostomy, [id number]; Colourethral fistula ; Renal insufficiency; Unspecified deficiency anemia; Tachycardia; UTI (lower urinary tract infection); Acute renal failure in the setting of indwelling ureteral stents and bilateral hydronephrosis; Hyperkalemia; Metabolic acidosis; Normocytic anemia; Rectosigmoid cancer, stage IV; Pyonephrosis; and Hypokalemia on his problem list.    ALLERGIES:  has No Known Allergies.  MEDICATIONS: has a current medication list which includes the following prescription(s): multivitamin with minerals and feeding supplement.  SURGICAL HISTORY:  Past Surgical History  Procedure Laterality Date  . Portacath placement  09-17-2009  . Colostomy takedown  12/20/2010    Procedure: LAPAROSCOPIC COLOSTOMY TAKEDOWN;  Surgeon: Adin Hector, MD;  Location: [facility];  Service: General;  Laterality: N/A;  Laparoscopy Colostomy Takedown With Ileostomy  . Port-a-cath removal  12/20/2010    Procedure: REMOVAL PORT-A-CATH;  Surgeon: Adin Hector, MD;  Location: [facility];  Service: General;  Laterality: Right;  Right  venous catheter no longer needed.  . Cystoscopy w/ ureteral stent placement  01/13/2011    Procedure: CYSTOSCOPY WITH RETROGRADE PYELOGRAM/URETERAL STENT PLACEMENT;  Surgeon: Franchot Gallo;  Location: [facility];  Service: Urology;  Laterality: Left;  intra-operative cystogram  . Tonsillectomy  yrs ago  . Fracture surgery   1950'S    RIGHT ARM SURGERY FOR FX  . Examination under anesthesia  06/19/2011    Procedure: EXAM UNDER ANESTHESIA;  Surgeon: Adin Hector, MD;  Location: [facility];  Service: General;;  . Cystoscopy w/ ureteral stent placement  06/19/2011    Procedure: CYSTOSCOPY WITH RETROGRADE PYELOGRAM/URETERAL STENT PLACEMENT;  Surgeon: Franchot Gallo, MD;  Location: [facility];  Service: Urology;  Laterality: Bilateral;  Cystoscopy/Bilateral Double J Stent insertion , flexible cystoscopy   . Cystoscopy w/ ureteral stent placement  10/24/2011    Procedure: CYSTOSCOPY WITH STENT REPLACEMENT;  Surgeon: Franchot Gallo, MD;  Location[facility];  Service: Urology;  Laterality: Bilateral;  45 mins requested for this case   . Cystoscopy w/ retrogrades  10/24/2011    Procedure: CYSTOSCOPY WITH RETROGRADE PYELOGRAM;  Surgeon: Franchot Gallo, MD;  Location: [facility];  Service: Urology;  Laterality: Left;  . Transanal resection of rectal polyp and sigmoid colectomy  03/15/2004  . Left hydrocele surgery  03/16/2009  . Cystoscopy w/ ureteral stent placement Bilateral 04/09/2012    Procedure: CYSTOSCOPY WITH bilateral  STENT REPLACEMENT;  Surgeon: Franchot Gallo, MD;  Location: [facility];  Service: Urology;  Laterality: Bilateral;  . Cysto/ left retrograde pyelogram/ left stent placement  03-01-2010  &  09-13-2010  . Multiple nephrostomy tube changes  every six weeks  . Colon surgery  08/07/09    Extensive lysis adhesions/ Re-do Low Anterior Rescection, Reconstruction left external iliac artery/ Left ureterolysis and ureter disposition  . Cystoscopy w/ ureteral stent placement Bilateral 12/13/2012    Procedure: CYSTOSCOPY WITH STENT REPLACEMENT;  Surgeon: Franchot Gallo, MD;  Location: [facility];  Service: Urology;  Laterality: Bilateral;  . Cystoscopy with ureteroscopy and stent placement Bilateral 02/18/2013    Procedure: CYSTOSCOPY WITH  URETEROSCOPY AND BILATERAL STENT EXCHANGE;  Surgeon: Franchot Gallo, MD;  Location: [facility];  Service: Urology;  Laterality: Bilateral;   PROBLEM LIST: 1. Adenocarcinoma of the rectosigmoid stage IVA, T4b N2a M1a cancer involving with involvement of periaortic lymph node. Diagnosis was established in July of 2011. The patient underwent rather extensive surgery on both July 5th and August 08, 2009. There were multiple tumor nodules. Six out of 26 lymph nodes were involved along with adjacent small bowel. There was involvement of a periaortic lymph node. There was a close radial margin. There was tumor thrombus in a vein. There was extracapsular extension, lymphovascular and perineural invasion, invasion of tumor into the left external iliac artery and left ureter. KRAS mutation was not detected, and thus the tumor is KRAS wild type. Surgery involved a colostomy and placement of a left ureteral stent. There was a question of whether this represented a de novo cancer or possibly recurrence from a previous sigmoid well-differentiated adenocarcinoma that was resected by Dr. Georganna Skeans on 03/15/2004. This tumor arose apparently in a polyp with 0/3 lymph nodes and was felt to be T1 stage I. The margin, however, was 0.5 mm. After the patient's surgery in July 2011, he received 7 cycles of FOLFOX from 09/26/2009 through 12/24/2009. The patient received pelvic radiation with continuous infusion 5-fluorouracil from 01/07/2010  through 02/15/2009. The patient then received 3 cycles of 5-FU leucovorin and 5-FU by continuous infusion from 03/04/2010 through 04/01/2010. 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 PET scan carried out on 06/16/2011 showed no evidence of recurrent or metastatic disease.  2. Ileostomy, 12/20/2010.  3. Right leg weakness secondary to femoral nerve injuries following  the patient's surgery in July 2011. Currently resolved.  4. The patient underwent  extensive surgery on December 20, 2010. This consisted of lysis of adhesions, laparoscopic-assisted splenic flexure mobilization, laparoscopic colostomy takedown, primary repair of bladder injury, diverting loop ileostomy in the right upper quadrant, and removal of the patient's Port-A-Cath.  5. Bilateral ureteral stents, most recently exchanged this month on 03/01/2013 by Dr. Diona Fanti.  6. 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  7. Progressive weight loss.  8. Anemia noted in January 2013 with a prior history of iron  deficiency anemia requiring intravenous Feraheme 510 mg  on 09/26/2009 and on 10/10/2009. Renal insufficiency is probably contributing to this patient's anemia at the present time.  9. Development of renal insufficiency noted in May 2013 with acute decompensation noted on 11/04/2011.  10. Bilateral percutaneous nephrostomies carried out on January 10, 2012 for  bilateral hydronephrosis. These tubes were removed in April 2014 but  needed to be reinserted on 06/15/2012 because of renal decompensation.   REVIEW OF SYSTEMS:   Constitutional: Denies fevers, chills or abnormal weight loss Eyes: Denies blurriness of vision Ears, nose, mouth, throat, and face: Denies mucositis or sore throat Respiratory: Denies cough, dyspnea or wheezes Cardiovascular: Denies palpitation, chest discomfort or lower extremity swelling Gastrointestinal:  Denies nausea, heartburn or change in bowel habits Skin: Denies abnormal skin rashes Lymphatics: Denies new lymphadenopathy or easy bruising Neurological:Denies numbness, tingling or new weaknesses Behavioral/Psych: Mood is stable, no new changes  All other systems were reviewed with the patient and are negative.  PHYSICAL EXAMINATION: ECOG PERFORMANCE STATUS: 0-Asymptomatic  Blood pressure 123/74, pulse 63, temperature 98 F (36.7 C), temperature source Oral, resp. rate 18, height 5' 9"  (1.753 m), weight 160 lb 14.4 oz (72.984 kg), SpO2  99.00%.  GENERAL:alert, no distress and comfortable thin middle-aged male SKIN: skin color, texture, turgor are normal, no rashes or significant lesions EYES: normal, Conjunctiva are pink and non-injected, sclera clear OROPHARYNX:no exudate, no erythema and lips, buccal mucosa, and tongue normal  NECK: supple, thyroid normal size, non-tender, without nodularity LYMPH:  no palpable lymphadenopathy in the cervical, axillary or supraclavicular LUNGS: clear to auscultation and percussion with normal breathing effort HEART: regular rate & rhythm and no murmurs and one plus pretibial edema (left greater than right) ABDOMEN:abdomen soft, non-tender and normal bowel sounds; ileostomy bag in right mid abdomen; he does also have bilateral nephrostomy tubes in place. Also has a Foley catheter with urine that is clear. Musculoskeletal:no cyanosis of digits and no clubbing  NEURO: alert & oriented x 3 with fluent speech, no focal motor/sensory deficits   LABORATORY DATA: Results for orders placed in visit on 10/31/13 (from the past 48 hour(s))  CBC WITH DIFFERENTIAL     Status: Abnormal   Collection Time    10/31/13  9:55 AM      Result Value Ref Range   WBC 5.0  4.0 - 10.3 10e3/uL   NEUT# 3.9  1.5 - 6.5 10e3/uL   HGB 10.9 (*) 13.0 - 17.1 g/dL   HCT 33.7 (*) 38.4 - 49.9 %   Platelets 250  140 - 400 10e3/uL   MCV 92.9  79.3 -  98.0 fL   MCH 30.0  27.2 - 33.4 pg   MCHC 32.3  32.0 - 36.0 g/dL   RBC 3.63 (*) 4.20 - 5.82 10e6/uL   RDW 15.0 (*) 11.0 - 14.6 %   lymph# 0.3 (*) 0.9 - 3.3 10e3/uL   MONO# 0.5  0.1 - 0.9 10e3/uL   Eosinophils Absolute 0.3  0.0 - 0.5 10e3/uL   Basophils Absolute 0.0  0.0 - 0.1 10e3/uL   NEUT% 77.9 (*) 39.0 - 75.0 %   LYMPH% 6.2 (*) 14.0 - 49.0 %   MONO% 9.6  0.0 - 14.0 %   EOS% 5.5  0.0 - 7.0 %   BASO% 0.8  0.0 - 2.0 %  LACTATE DEHYDROGENASE (CC13)     Status: Abnormal   Collection Time    10/31/13  9:56 AM      Result Value Ref Range   LDH 110 (*) 125 - 245 U/L   COMPREHENSIVE METABOLIC PANEL (MI68)     Status: Abnormal   Collection Time    10/31/13  9:56 AM      Result Value Ref Range   Sodium 143  136 - 145 mEq/L   Potassium 4.3  3.5 - 5.1 mEq/L   Chloride 115 (*) 98 - 109 mEq/L   CO2 20 (*) 22 - 29 mEq/L   Glucose 76  70 - 140 mg/dl   BUN 47.2 (*) 7.0 - 26.0 mg/dL   Creatinine 2.9 (*) 0.7 - 1.3 mg/dL   Total Bilirubin 0.24  0.20 - 1.20 mg/dL   Alkaline Phosphatase 55  40 - 150 U/L   AST 14  5 - 34 U/L   ALT 12  0 - 55 U/L   Total Protein 6.8  6.4 - 8.3 g/dL   Albumin 3.5  3.5 - 5.0 g/dL   Calcium 9.6  8.4 - 10.4 mg/dL   Anion Gap 8  3 - 11 mEq/L        Chemistry      Component Value Date/Time   NA 143 10/31/2013 0956   NA 145 02/18/2013 1124   K 4.3 10/31/2013 0956   K 4.3 02/18/2013 1124   CL 115* 02/18/2013 1124   CL 113* 07/01/2012 1427   CO2 20* 10/31/2013 0956   CO2 12* 06/15/2012 0925   BUN 47.2* 10/31/2013 0956   BUN 46* 02/18/2013 1124   CREATININE 2.9* 10/31/2013 0956   CREATININE 3.30* 02/18/2013 1124   CREATININE 2.04* 01/08/2011 1540      Component Value Date/Time   CALCIUM 9.6 10/31/2013 0956   CALCIUM 9.4 06/15/2012 0925   ALKPHOS 55 10/31/2013 0956   ALKPHOS 114 06/11/2012 1725   AST 14 10/31/2013 0956   AST 15 06/11/2012 1725   ALT 12 10/31/2013 0956   ALT 33 06/11/2012 1725   BILITOT 0.24 10/31/2013 0956   BILITOT 0.2* 06/11/2012 1725     Results for GEROLD, SAR (MRN [id number]) as of 06/29/2013 12:54  Ref. Range 03/05/2012 15:46 07/01/2012 14:27 11/02/2012 11:03 03/02/2013 10:18  CEA Latest Range: 0.0-5.0 ng/mL 1.3 1.4 1.2 1.7   Iron/TIBC/Ferritin    Component Value Date/Time   IRON 54 06/29/2013 1008   IRON 47 07/01/2012 1427   TIBC 294 06/29/2013 1008   TIBC 260 07/01/2012 1427   FERRITIN 37 06/29/2013 1008   FERRITIN 176 07/01/2012 1427   RADIOGRAPHIC STUDIES: PET 03/18/2013 NUCLEAR MEDICINE PET SKULL BASE TO THIGH FASTING BLOOD GLUCOSE: Value: 69 mg/dl  TECHNIQUE: 8.4 mCi F-18 FDG was  injected intravenously. Full-ring  PET imaging was performed from the skull base to thigh after the radiotracer. CT data was obtained and used for attenuation correction and anatomic localization. COMPARISON: SP US GUIDANCE dated 06/15/2012; NM PET IMAGE RESTAG  (PS) SKULL BASE TO THIGH dated 06/16/2011; NM PET IMAGE RESTAG (PS) SKULL BASE TO THIGH dated 11/29/2010 FINDINGS: NECK No hypermetabolic cervical lymph nodes are identified.There are no  lesions of the pharyngeal mucosal space. There is stable nodularity of the thyroid gland without associated abnormal metabolic activity. CHEST There are no hypermetabolic mediastinal, hilar or axillary lymph nodes. There is no hypermetabolic pulmonary activity. The lungs are clear. Mild emphysematous changes are present. ABDOMEN/PELVIS There is no hypermetabolic activity within the liver, adrenal  glands, spleen or pancreas. There is no hypermetabolic nodal activity. Patient is status post subtotal colectomy and ileostomy. There are bilateral percutaneous nephroureteral catheters. The distal ends of both catheters are within the bladder, although the distal left pigtail loop is unformed. The bladder is decompressed by a Foley catheter. There is activity within the ureters bilaterally which remain mildly dilated, but stable. The prostate gland appears grossly stable. The presacral soft tissue process has partially contracted and demonstrates no abnormal metabolic activity. SKELETON There is no hypermetabolic activity to suggest osseous metastatic disease. IMPRESSION: 1. No evidence of local recurrence or metastatic disease. 2. There is no abnormal activity within the presacral fibrotic process. 3. Bilateral nephroureteral catheters remain in place. The distal end of the left pigtail catheter is straightened within the bladder.  ASSESSMENT: DEWARD SEBEK 73 y.o. male with a history of stage 4 colon cancer in remission. Labs reviewed and are normal. Hemoglobin is better, renal function stable and LFTs are  normal as well.  PLAN:  --Mr. Aderhold continues to do well without evidence of recurrent, mestastatic disease.  We reviewed his laboratories and scans including his PET scan done on 03/19/2011.  There was no hypermetabolic activity to suggest local recurrence or metastatic disease.   --Patient has bilateral nephrostomy tubes in place, bilateral urethral stents, ileostomy.  He will continue with bilateral nephrostomy tube exchanges every 6 weeks. Will continue to be managed with a Foley catheter, decompression of the bladder and bilateral nephrostomies.  --Patient instructed to return to clinic in 6 months at which time we will check his complete blood count, chemistries, iron studies, and a CEA. We will call him with CEA results.  --Regarding his iron Deficiency anemia, hemoglobin is 10.9 today and he is without symptoms of anemia, i.e.fatigue.    All questions were answered. The patient knows to call the clinic with any problems, questions or concerns. We can certainly see the patient much sooner if necessary.  I spent 15 minutes counseling the patient face to face. The total time spent in the appointment was 20 minutes.    Bernadene Bell, MD Medical Hematologist/Oncologist [place] Pager: [phone] Office No: [phone]

## 2013-11-06 ENCOUNTER — Encounter: Payer: Self-pay | Admitting: Hematology

## 2013-11-08 ENCOUNTER — Encounter (HOSPITAL_COMMUNITY): Payer: Self-pay | Admitting: Pharmacy Technician

## 2013-11-10 ENCOUNTER — Other Ambulatory Visit: Payer: Self-pay | Admitting: Urology

## 2013-11-10 ENCOUNTER — Ambulatory Visit (HOSPITAL_COMMUNITY)
Admission: RE | Admit: 2013-11-10 | Discharge: 2013-11-10 | Disposition: A | Discharge status: Home / Self Care | Payer: Medicare Other | Source: Ambulatory Visit | Attending: Urology | Admitting: Urology

## 2013-11-10 DIAGNOSIS — N1330 Unspecified hydronephrosis: Secondary | ICD-10-CM

## 2013-11-10 DIAGNOSIS — Z85038 Personal history of other malignant neoplasm of large intestine: Secondary | ICD-10-CM | POA: Insufficient documentation

## 2013-11-10 DIAGNOSIS — N135 Crossing vessel and stricture of ureter without hydronephrosis: Secondary | ICD-10-CM | POA: Diagnosis not present

## 2013-11-10 DIAGNOSIS — Z436 Encounter for attention to other artificial openings of urinary tract: Secondary | ICD-10-CM | POA: Insufficient documentation

## 2013-11-10 DIAGNOSIS — N360 Urethral fistula: Secondary | ICD-10-CM | POA: Insufficient documentation

## 2013-11-10 MED ORDER — LIDOCAINE HCL 1 % IJ SOLN
INTRAMUSCULAR | Status: AC
  Filled 2013-11-10: qty 20

## 2013-11-10 MED ORDER — IOHEXOL 300 MG/ML  SOLN
10.0000 mL | Freq: Once | INTRAMUSCULAR | Status: AC | PRN
  Administered 2013-11-10: 10 mL

## 2013-11-16 IMAGING — XA IR BILIARY CATHETER EXCHANGE
1 series · 4 of 4 positions shown · non-contrast
Comparison: none

CLINICAL DATA: colon carcinoma.    Ileal conduit. Ureteral
obstruction despite presence of   ureteral stents, requiring long-
term percutaneous nephrostomy drainage.

[Series 300: tube placements · 4 of 4 slices shown]
[im 1/4]
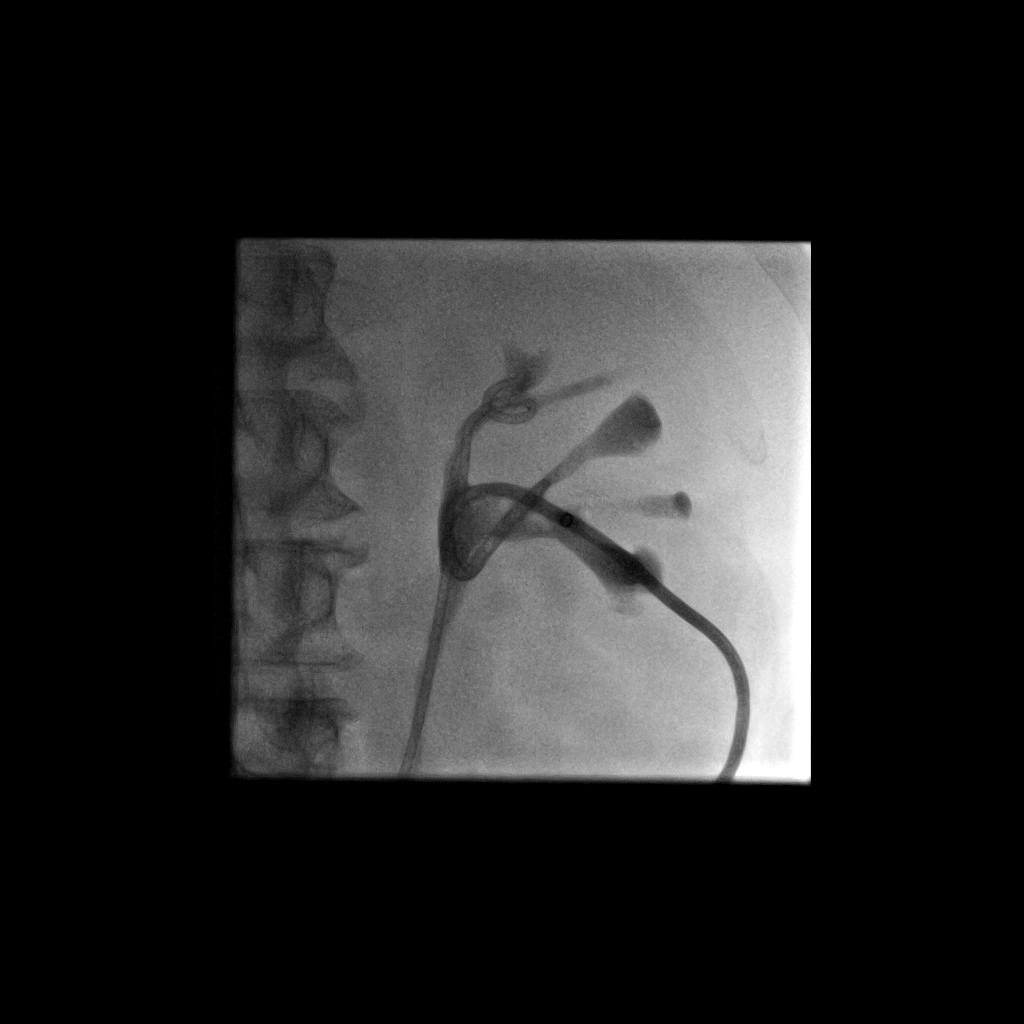
[im 2/4]
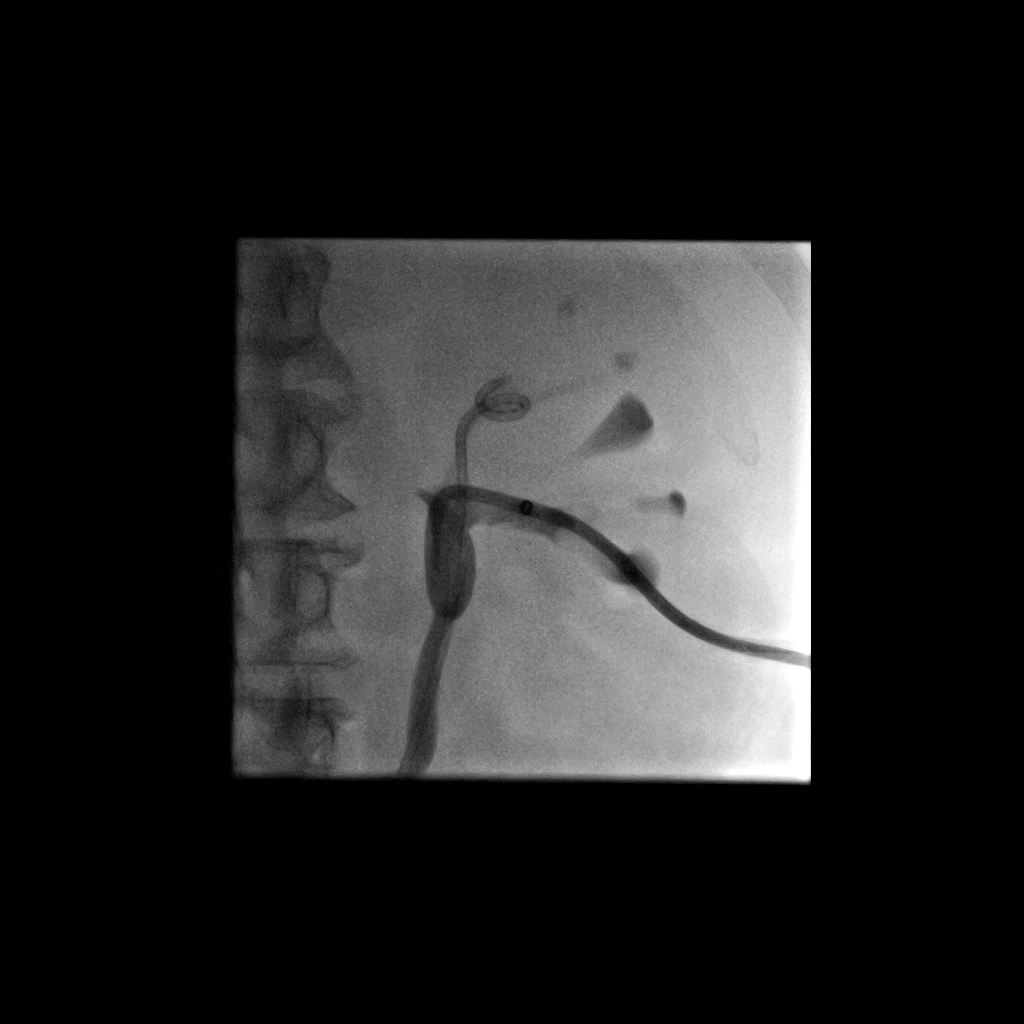
[im 3/4]
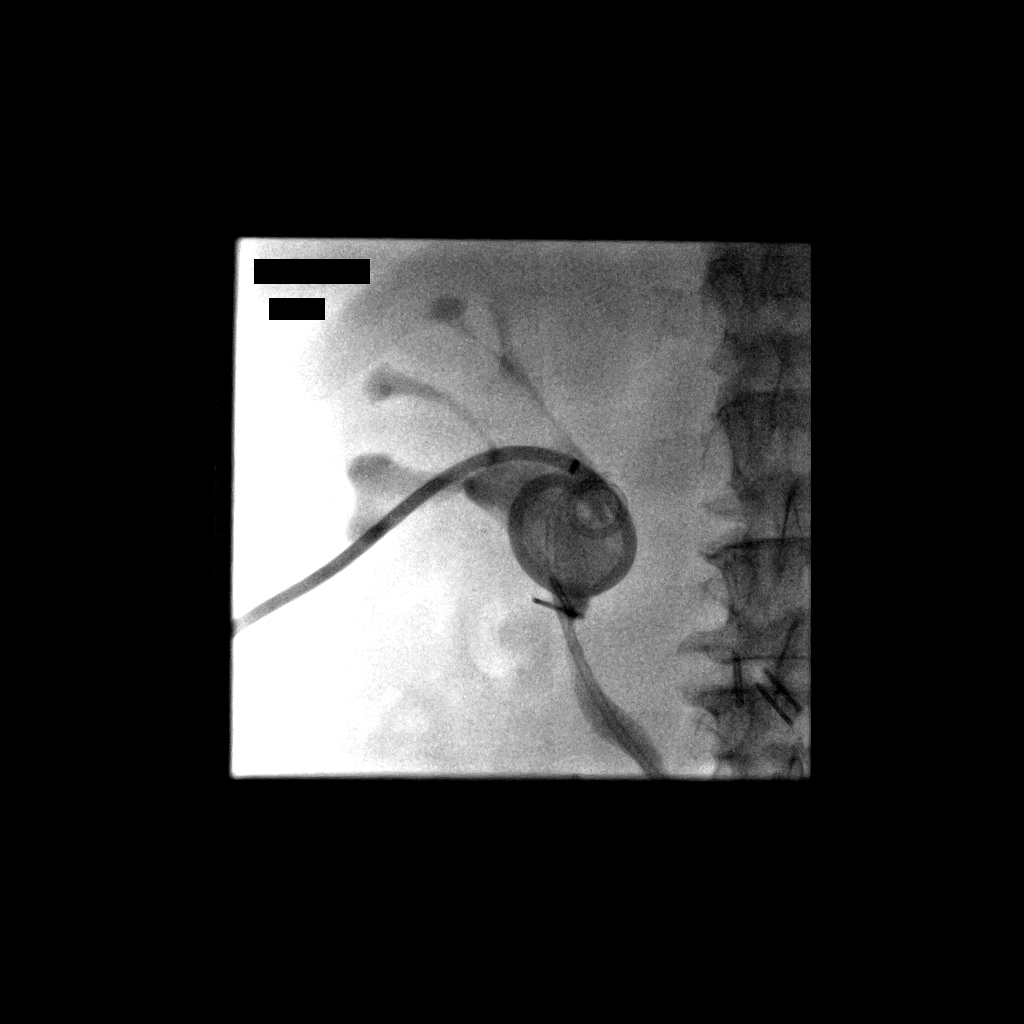
[im 4/4]
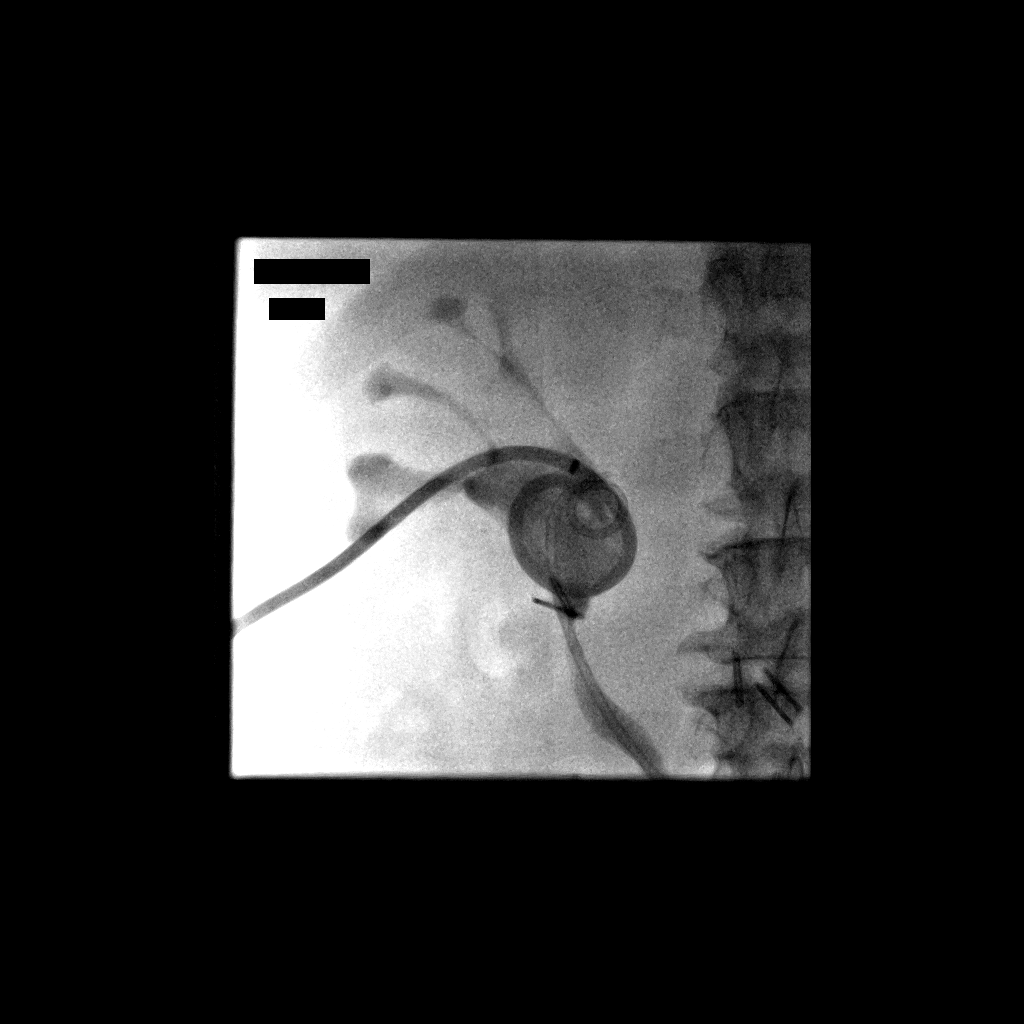

[4 of 4 positions shown; findings below may reference images not displayed]

BILATERAL PERCUTANEOUS NEPHROSTOMY CATHETER EXCHANGE UNDER
FLUOROSCOPY

Technique and findings:  The procedure, risks (including but not
limited to bleeding, infection, organ damage), benefits, and
alternatives were explained to the patient.  Questions regarding
the procedure were encouraged and answered.  The patient
understands and consents to the procedure. The nephrostomy tubes
and surrounding skin were prepped with Betadine, draped in usual
sterile fashion.

A small amount of contrast was injected through the right
nephrostomy catheter to opacify the renal collecting system.  The
catheter was cut and exchanged over a 0.035" angiographic wire for
a new 10-French pigtail catheter, formed centrally within the
collecting system under fluoroscopy.  Contrast injection confirms
appropriate positioning.

In a similar fashion, the left nephrostomy catheter was injected,
cut, and exchanged for a new 10-French pigtail catheter, formed
centrally within the left renal collecting system.  Injection
confirms appropriate positioning and patency.  Both catheters were
secured externally with  Statlock devices and   0- Prolene sutures.
The patient tolerated the procedure well, with no immediate
complication.

Fluoroscopy time: 48 seconds

IMPRESSION
1.  Technically successful exchange of bilateral nephrostomy
catheters under fluoroscopy.
Consider future use of Kassam catheter on the right due to
small central renal collecting system.

## 2013-12-02 DIAGNOSIS — D649 Anemia, unspecified: Secondary | ICD-10-CM | POA: Diagnosis not present

## 2013-12-02 DIAGNOSIS — N179 Acute kidney failure, unspecified: Secondary | ICD-10-CM | POA: Diagnosis not present

## 2013-12-02 DIAGNOSIS — N139 Obstructive and reflux uropathy, unspecified: Secondary | ICD-10-CM | POA: Diagnosis not present

## 2013-12-02 DIAGNOSIS — N2581 Secondary hyperparathyroidism of renal origin: Secondary | ICD-10-CM | POA: Diagnosis not present

## 2013-12-05 DIAGNOSIS — R339 Retention of urine, unspecified: Secondary | ICD-10-CM | POA: Diagnosis not present

## 2013-12-07 DIAGNOSIS — D631 Anemia in chronic kidney disease: Secondary | ICD-10-CM | POA: Diagnosis not present

## 2013-12-07 DIAGNOSIS — N184 Chronic kidney disease, stage 4 (severe): Secondary | ICD-10-CM | POA: Diagnosis not present

## 2013-12-07 DIAGNOSIS — N179 Acute kidney failure, unspecified: Secondary | ICD-10-CM | POA: Diagnosis not present

## 2013-12-07 DIAGNOSIS — N139 Obstructive and reflux uropathy, unspecified: Secondary | ICD-10-CM | POA: Diagnosis not present

## 2013-12-16 ENCOUNTER — Other Ambulatory Visit: Payer: Self-pay | Admitting: Urology

## 2014-01-05 ENCOUNTER — Other Ambulatory Visit: Payer: Self-pay | Admitting: Urology

## 2014-01-05 ENCOUNTER — Ambulatory Visit (HOSPITAL_COMMUNITY)
Admission: RE | Admit: 2014-01-05 | Discharge: 2014-01-05 | Disposition: A | Discharge status: Home / Self Care | Payer: Medicare Other | Source: Ambulatory Visit | Attending: Urology | Admitting: Urology

## 2014-01-05 DIAGNOSIS — Z436 Encounter for attention to other artificial openings of urinary tract: Secondary | ICD-10-CM | POA: Insufficient documentation

## 2014-01-05 DIAGNOSIS — Z85038 Personal history of other malignant neoplasm of large intestine: Secondary | ICD-10-CM | POA: Insufficient documentation

## 2014-01-05 DIAGNOSIS — N1330 Unspecified hydronephrosis: Secondary | ICD-10-CM

## 2014-01-05 DIAGNOSIS — N360 Urethral fistula: Secondary | ICD-10-CM | POA: Insufficient documentation

## 2014-01-05 DIAGNOSIS — N135 Crossing vessel and stricture of ureter without hydronephrosis: Secondary | ICD-10-CM | POA: Diagnosis not present

## 2014-01-05 MED ORDER — IOHEXOL 300 MG/ML  SOLN
10.0000 mL | Freq: Once | INTRAMUSCULAR | Status: AC | PRN
  Administered 2014-01-05: 20 mL

## 2014-01-05 NOTE — Procedures (Signed)
Successful exchange of bilateral nephrostomy tubes.  No immediate complication.  See Radiology report.

## 2014-01-09 ENCOUNTER — Encounter (HOSPITAL_BASED_OUTPATIENT_CLINIC_OR_DEPARTMENT_OTHER): Payer: Self-pay | Admitting: *Deleted

## 2014-01-10 ENCOUNTER — Encounter (HOSPITAL_BASED_OUTPATIENT_CLINIC_OR_DEPARTMENT_OTHER): Payer: Self-pay | Admitting: *Deleted

## 2014-01-10 NOTE — Progress Notes (Signed)
NPO AFTER MN. ARRIVE AT 1015. NEEDS ISTAT 8.

## 2014-01-13 ENCOUNTER — Encounter (HOSPITAL_BASED_OUTPATIENT_CLINIC_OR_DEPARTMENT_OTHER): Payer: Self-pay | Admitting: *Deleted

## 2014-01-13 ENCOUNTER — Encounter (HOSPITAL_BASED_OUTPATIENT_CLINIC_OR_DEPARTMENT_OTHER)
Admission: RE | Disposition: A | Discharge status: Home / Self Care | Payer: Self-pay | Source: Ambulatory Visit | Attending: Urology

## 2014-01-13 ENCOUNTER — Ambulatory Visit (HOSPITAL_BASED_OUTPATIENT_CLINIC_OR_DEPARTMENT_OTHER)
Admission: RE | Admit: 2014-01-13 | Discharge: 2014-01-13 | Disposition: A | Discharge status: Home / Self Care | Payer: Medicare Other | Source: Ambulatory Visit | Attending: Urology | Admitting: Urology

## 2014-01-13 ENCOUNTER — Ambulatory Visit (HOSPITAL_BASED_OUTPATIENT_CLINIC_OR_DEPARTMENT_OTHER): Payer: Medicare Other | Admitting: Anesthesiology

## 2014-01-13 DIAGNOSIS — Z936 Other artificial openings of urinary tract status: Secondary | ICD-10-CM | POA: Diagnosis not present

## 2014-01-13 DIAGNOSIS — Z87891 Personal history of nicotine dependence: Secondary | ICD-10-CM | POA: Insufficient documentation

## 2014-01-13 DIAGNOSIS — N183 Chronic kidney disease, stage 3 (moderate): Secondary | ICD-10-CM | POA: Diagnosis not present

## 2014-01-13 DIAGNOSIS — Z85038 Personal history of other malignant neoplasm of large intestine: Secondary | ICD-10-CM | POA: Insufficient documentation

## 2014-01-13 DIAGNOSIS — D509 Iron deficiency anemia, unspecified: Secondary | ICD-10-CM | POA: Insufficient documentation

## 2014-01-13 DIAGNOSIS — Z823 Family history of stroke: Secondary | ICD-10-CM | POA: Insufficient documentation

## 2014-01-13 DIAGNOSIS — N289 Disorder of kidney and ureter, unspecified: Secondary | ICD-10-CM | POA: Diagnosis not present

## 2014-01-13 DIAGNOSIS — N1330 Unspecified hydronephrosis: Secondary | ICD-10-CM | POA: Diagnosis not present

## 2014-01-13 DIAGNOSIS — N135 Crossing vessel and stricture of ureter without hydronephrosis: Secondary | ICD-10-CM | POA: Diagnosis not present

## 2014-01-13 DIAGNOSIS — Z932 Ileostomy status: Secondary | ICD-10-CM

## 2014-01-13 DIAGNOSIS — N201 Calculus of ureter: Secondary | ICD-10-CM | POA: Diagnosis not present

## 2014-01-13 HISTORY — DX: Crossing vessel and stricture of ureter without hydronephrosis: N13.5

## 2014-01-13 HISTORY — PX: CYSTOSCOPY W/ URETERAL STENT PLACEMENT: SHX1429

## 2014-01-13 LAB — POCT I-STAT, CHEM 8
BUN: 56 mg/dL — ABNORMAL HIGH (ref 6–23)
CHLORIDE: 117 meq/L — AB (ref 96–112)
Calcium, Ion: 1.41 mmol/L — ABNORMAL HIGH (ref 1.13–1.30)
Creatinine, Ser: 3.6 mg/dL — ABNORMAL HIGH (ref 0.50–1.35)
Glucose, Bld: 86 mg/dL (ref 70–99)
HCT: 32 % — ABNORMAL LOW (ref 39.0–52.0)
HEMOGLOBIN: 10.9 g/dL — AB (ref 13.0–17.0)
Potassium: 4.5 mEq/L (ref 3.7–5.3)
SODIUM: 145 meq/L (ref 137–147)
TCO2: 14 mmol/L (ref 0–100)

## 2014-01-13 SURGERY — CYSTOSCOPY, FLEXIBLE, WITH STENT REPLACEMENT
Anesthesia: General | Site: Ureter | Laterality: Bilateral

## 2014-01-13 MED ORDER — FENTANYL CITRATE 0.05 MG/ML IJ SOLN
INTRAMUSCULAR | Status: DC | PRN
  Administered 2014-01-13: 50 ug via INTRAVENOUS

## 2014-01-13 MED ORDER — FENTANYL CITRATE 0.05 MG/ML IJ SOLN
25.0000 ug | INTRAMUSCULAR | Status: DC | PRN
Start: 2014-01-13 — End: 2014-01-13
  Filled 2014-01-13: qty 1

## 2014-01-13 MED ORDER — FENTANYL CITRATE 0.05 MG/ML IJ SOLN
INTRAMUSCULAR | Status: AC
  Filled 2014-01-13: qty 4

## 2014-01-13 MED ORDER — DEXTROSE 5 % IV SOLN
5.0000 mg/kg | INTRAVENOUS | Status: DC
  Filled 2014-01-13: qty 9.25

## 2014-01-13 MED ORDER — SODIUM CHLORIDE 0.9 % IV SOLN
INTRAVENOUS | Status: DC
  Administered 2014-01-13: 11:00:00 via INTRAVENOUS
  Filled 2014-01-13: qty 1000

## 2014-01-13 MED ORDER — GENTAMICIN SULFATE 40 MG/ML IJ SOLN
340.0000 mg | INTRAMUSCULAR | Status: DC | PRN
  Administered 2014-01-13: 102 mg via INTRAVENOUS

## 2014-01-13 MED ORDER — SODIUM CHLORIDE 0.9 % IR SOLN
Status: DC | PRN
  Administered 2014-01-13: 3000 mL

## 2014-01-13 MED ORDER — LACTATED RINGERS IV SOLN
INTRAVENOUS | Status: DC
  Filled 2014-01-13: qty 1000

## 2014-01-13 MED ORDER — CEPHALEXIN 500 MG PO CAPS: 500.0000 mg | ORAL_CAPSULE | Freq: Four times a day (QID) | ORAL | Status: DC

## 2014-01-13 MED ORDER — LIDOCAINE HCL (CARDIAC) 20 MG/ML IV SOLN
INTRAVENOUS | Status: DC | PRN
  Administered 2014-01-13: 60 mg via INTRAVENOUS

## 2014-01-13 MED ORDER — PROPOFOL 10 MG/ML IV BOLUS
INTRAVENOUS | Status: DC | PRN
  Administered 2014-01-13: 130 mg via INTRAVENOUS
  Administered 2014-01-13: 50 mg via INTRAVENOUS

## 2014-01-13 SURGICAL SUPPLY — 13 items
BAG DRAIN URO-CYSTO SKYTR STRL (DRAIN) ×2 IMPLANT
BAG DRN UROCATH (DRAIN) ×1
CANISTER SUCT LVC 12 LTR MEDI- (MISCELLANEOUS) ×1 IMPLANT
CLOTH BEACON ORANGE TIMEOUT ST (SAFETY) ×2 IMPLANT
DRAPE CAMERA CLOSED 9X96 (DRAPES) ×2 IMPLANT
GLOVE BIO SURGEON STRL SZ8 (GLOVE) ×2 IMPLANT
GLOVE BIOGEL M 6.5 STRL (GLOVE) ×1 IMPLANT
GOWN STRL REUS W/TWL LRG LVL3 (GOWN DISPOSABLE) ×1 IMPLANT
GOWN STRL REUS W/TWL XL LVL3 (GOWN DISPOSABLE) ×1 IMPLANT
GUIDEWIRE STR DUAL SENSOR (WIRE) ×1 IMPLANT
NS IRRIG 500ML POUR BTL (IV SOLUTION) ×1 IMPLANT
PACK CYSTO (CUSTOM PROCEDURE TRAY) ×2 IMPLANT
STENT CONTOUR 7FRX24 (STENTS) ×2 IMPLANT

## 2014-01-13 NOTE — Op Note (Signed)
PATIENT:  Eric Munoz  PRE-OPERATIVE DIAGNOSIS: Bilateral ureteral strictures  POST-OPERATIVE DIAGNOSIS: Same  PROCEDURE: Cystoscopy, exchange of bilateral double-J stents (24 cm x 7 [place] without tether)  SURGEON:  Lillette Boxer. Abundio Teuscher, M.D.  ANESTHESIA:  General  EBL:  Minimal  DRAINS: None  LOCAL MEDICATIONS USED:  None  SPECIMEN:  None  INDICATION: Eric Munoz is a 73 year old male with recurrent/persistent bilateral ureteral strictures. He is here today for double-J stent exchange.  Description of procedure: The patient was properly identified and marked (if applicable) in the holding area. They were then  taken to the operating room and placed on the table in a supine position. General anesthesia was then administered. Once fully anesthetized the patient was moved to the dorsolithotomy position and the genitalia and perineum were sterilely prepped and draped in standard fashion. An official timeout was then performed.  A 22 French panendoscope was passed into his bladder which was inspected and found to be normal except for inflammation around the ureteral orifices. 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 The right double-J stent was grasped and brought through the urethral meatus. A guidewire was advanced through the stent and up in the right renal pelvis using fluoroscopic guidance. The stent was then removed over the guidewire. I then placed a 24 cm x 7 French double-J stent over top of the guidewire using fluoroscopic guidance. The guidewire was removed and good proximal and distal curls were seen. Same procedure was done for the left ureteral stent. Following placement, adequate placement was verified fluoroscopically and cystoscopically. The bladder was drained and the procedure terminated. Patient tolerated procedure well.    PLAN OF CARE: Discharge to home after PACU  PATIENT DISPOSITION:  PACU - hemodynamically stable.

## 2014-01-13 NOTE — Anesthesia Procedure Notes (Signed)
Procedure Name: LMA Insertion Date/Time: 01/13/2014 11:50 AM Performed by: Bethena Roys T Pre-anesthesia Checklist: Patient identified, Timeout performed, Emergency Drugs available, Suction available and Patient being monitored Patient Re-evaluated:Patient Re-evaluated prior to inductionOxygen Delivery Method: Circle system utilized Preoxygenation: Pre-oxygenation with 100% oxygen Intubation Type: IV induction Ventilation: Mask ventilation without difficulty LMA: LMA inserted LMA Size: 4.0 Number of attempts: 1 Placement Confirmation: breath sounds checked- equal and bilateral and positive ETCO2 Tube secured with: Tape Dental Injury: Teeth and Oropharynx as per pre-operative assessment

## 2014-01-13 NOTE — Transfer of Care (Signed)
Immediate Anesthesia Transfer of Care Note  Patient: Eric Munoz  Procedure(s) Performed: Procedure(s): CYSTOSCOPY WITH STENT REPLACEMENT (Bilateral)  Patient Location: PACU  Anesthesia Type:General  Level of Consciousness: awake, alert  and oriented  Airway & Oxygen Therapy: Patient Spontanous Breathing and Patient connected to nasal cannula oxygen  Post-op Assessment: Report given to PACU RN  Post vital signs: Reviewed and stable  Complications: No apparent anesthesia complications

## 2014-01-13 NOTE — Anesthesia Postprocedure Evaluation (Signed)
  Anesthesia Post-op Note  Patient: Eric Munoz  Procedure(s) Performed: Procedure(s) (LRB): CYSTOSCOPY WITH STENT REPLACEMENT (Bilateral)  Patient Location: PACU  Anesthesia Type: General  Level of Consciousness: awake and alert   Airway and Oxygen Therapy: Patient Spontanous Breathing  Post-op Pain: mild  Post-op Assessment: Post-op Vital signs reviewed, Patient's Cardiovascular Status Stable, Respiratory Function Stable, Patent Airway and No signs of Nausea or vomiting  Last Vitals:  Filed Vitals:   01/13/14 1024  BP: 134/75  Pulse: 91  Temp: 36.6 C  Resp: 16    Post-op Vital Signs: stable   Complications: No apparent anesthesia complications

## 2014-01-13 NOTE — Discharge Instructions (Signed)
1. You may see some blood in the urine and may have some burning with urination for 48-72 hours. You also may notice that you have to urinate more frequently or urgently after your procedure which is normal.  °2. You should call should you develop an inability urinate, fever > 101, persistent nausea and vomiting that prevents you from eating or drinking to stay hydrated.  °3. If you have a stent, you will likely urinate more frequently and urgently until the stent is removed and you may experience some discomfort/pain in the lower abdomen and flank especially when urinating. You may take pain medication prescribed to you if needed for pain. You may also intermittently have blood in the urine until the stent is removed. °4. If you have a catheter, you will be taught how to take care of the catheter by the nursing staff prior to discharge from the hospital.  You may periodically feel a strong urge to void with the catheter in place.  This is a bladder spasm and most often can occur when having a bowel movement or moving around. It is typically self-limited and usually will stop after a few minutes.  You may use some Vaseline or Neosporin around the tip of the catheter to reduce friction at the tip of the penis. You may also see some blood in the urine.  A very small amount of blood can make the urine look quite red.  As long as the catheter is draining well, there usually is not a problem.  However, if the catheter is not draining well and is bloody, you should call the office ([phone]) to notify us. ° ° °Post Anesthesia Home Care Instructions ° °Activity: °Get plenty of rest for the remainder of the day. A responsible adult should stay with you for 24 hours following the procedure.  °For the next 24 hours, DO NOT: °-Drive a car °-Operate machinery °-Drink alcoholic beverages °-Take any medication unless instructed by your physician °-Make any legal decisions or sign important papers. ° °Meals: °Start with liquid  foods such as gelatin or soup. Progress to regular foods as tolerated. Avoid greasy, spicy, heavy foods. If nausea and/or vomiting occur, drink only clear liquids until the nausea and/or vomiting subsides. Call your physician if vomiting continues. ° °Special Instructions/Symptoms: °Your throat may feel dry or sore from the anesthesia or the breathing tube placed in your throat during surgery. If this causes discomfort, gargle with warm salt water. The discomfort should disappear within 24 hours. ° ° °

## 2014-01-13 NOTE — H&P (Signed)
Urology History and Physical Exam  CC: Hydronephrosis, bilateral  HPI: 73 year old male presents for double-J stent exchange. 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 He has bilateral percutaneous nephrostomy tubes in, but desires to keep his stents for possible eventual surgical intervention.  PMH: Past Medical History  Diagnosis Date  . Urinoma, s/p bladder repair   . History of acute renal failure nov 2012  . Colovesical fistula   . S/P ileostomy     12-20-2010  . Foley catheter in place   . Nephrostomy status     CURRENTLY HAS BILATERAL TUBES w/ changes every 6 weeks  . History of colon cancer, stage IV     2007 w/ recurrence july 2011--  RECTOSIGMOID [id number] s/p left coloectomy/ lar--  chemo complete feb 2012;  rxt complete jan 2012----   . Iron deficiency anemia   . Ureteral stricture     bilateral--  bilateral ureteral stents w/  changes every 6 months approx.  . Chronic kidney disease (CKD), stage III (moderate) SECONDARY UNCLEAR ETIOLOGY, POSSIBLE RELATED RADIATION THERAPY      NEPHROLOGIST,  DR Marval Regal  . Acute-on-chronic kidney injury     SECONDARY TO OBSTRUCTIVE UROPATHY--  BILATERAL URETERAL STENTS, BILATERAL NEPHROSTOMY TUBES AND FOLEY CATHETER  . Rectosigmoid cancer     stage iv--  no recurrent mestastatic disease--  oncologist--  dr Shanon Brow chism/ dr Bernadene Bell ([facility])  . Difficult intubation NOTED ASA IV W/ MAY 2013 AT [facility] MAIN OR--  INTUBATED OVER BOUGIE  BY DR FORTUNE    PT DID OK W/ LMA AT [facility] 10-24-2011  -- DR ROSE &  dr fortune at [facility] 02-18-2013    [place]: Past Surgical History  Procedure Laterality Date  . Portacath placement  09-17-2009  . Colostomy takedown  12/20/2010    Procedure: LAPAROSCOPIC COLOSTOMY TAKEDOWN;  Surgeon:  Adin Hector, MD;  Location: [facility];  Service: General;  Laterality: N/A;  Laparoscopy Colostomy Takedown With Ileostomy  . Port-a-cath removal  12/20/2010    Procedure: REMOVAL PORT-A-CATH;  Surgeon: Adin Hector, MD;  Location: [facility];  Service: General;  Laterality: Right;  Right  venous catheter no longer needed.  . Cystoscopy w/ ureteral stent placement  01/13/2011    Procedure: CYSTOSCOPY WITH RETROGRADE PYELOGRAM/URETERAL STENT PLACEMENT;  Surgeon: Franchot Gallo;  Location: [facility];  Service: Urology;  Laterality: Left;  intra-operative cystogram  . Tonsillectomy  yrs ago  . Fracture surgery  1950'S    RIGHT ARM SURGERY FOR FX  . Examination under anesthesia  06/19/2011    Procedure: EXAM UNDER ANESTHESIA;  Surgeon: Adin Hector, MD;  Location: [facility];  Service: General;;  . Cystoscopy w/ ureteral stent placement  06/19/2011    Procedure: CYSTOSCOPY WITH RETROGRADE PYELOGRAM/URETERAL STENT PLACEMENT;  Surgeon: Franchot Gallo, MD;  Location: [facility];  Service: Urology;  Laterality: Bilateral;  Cystoscopy/Bilateral Double J Stent insertion , flexible cystoscopy   . Cystoscopy w/ ureteral stent placement  10/24/2011    Procedure: CYSTOSCOPY WITH STENT REPLACEMENT;  Surgeon: Franchot Gallo, MD;  Location: [facility];  Service: Urology;  Laterality: Bilateral;  45 mins requested for this case   . Cystoscopy w/ retrogrades  10/24/2011    Procedure: CYSTOSCOPY WITH RETROGRADE PYELOGRAM;  Surgeon: Franchot Gallo, MD;  Location: [facility];  Service: Urology;  Laterality: Left;  . Transanal resection of rectal polyp and sigmoid colectomy  03/15/2004  . Left hydrocele surgery  03/16/2009  . Cystoscopy w/ ureteral stent placement Bilateral 04/09/2012    Procedure: CYSTOSCOPY WITH bilateral  STENT REPLACEMENT;  Surgeon: Franchot Gallo, MD;  Location: [facility];  Service: Urology;  Laterality: Bilateral;  . Cysto/ left retrograde  pyelogram/ left stent placement  03-01-2010  &  09-13-2010  . Multiple nephrostomy tube changes  every six weeks  . Colon surgery  08/07/09    Extensive lysis adhesions/ Re-do Low Anterior Rescection, Reconstruction left external iliac artery/ Left ureterolysis and ureter disposition  . Cystoscopy w/ ureteral stent placement Bilateral 12/13/2012    Procedure: CYSTOSCOPY WITH STENT REPLACEMENT;  Surgeon: Franchot Gallo, MD;  Location: [facility];  Service: Urology;  Laterality: Bilateral;  . Cystoscopy with ureteroscopy and stent placement Bilateral 02/18/2013    Procedure: CYSTOSCOPY WITH URETEROSCOPY AND BILATERAL STENT EXCHANGE;  Surgeon: Franchot Gallo, MD;  Location[facility];  Service: Urology;  Laterality: Bilateral;    Allergies: No Known Allergies  Medications: Prescriptions prior to admission  Medication Sig Dispense Refill Last Dose  . Multiple Vitamin (MULTIVITAMIN WITH MINERALS) TABS Take 1 tablet by mouth daily.   01/12/2014 at Unknown time     Social History: History   Social History  . Marital Status: Married    Spouse Name: N/A    Number of Children: N/A  . Years of Education: N/A   Occupational History  . Not on file.   Social History Main Topics  . Smoking status: Former Smoker -- 1.00 packs/day for 10 years    Quit date: 02/03/1974  . Smokeless tobacco: Never Used  . Alcohol Use: No  . Drug Use: No  . Sexual Activity: Not on file   Other Topics Concern  . Not on file   Social History Narrative   Married. Lives with wife. Independent of ADLs.    Family History: Family History  Problem Relation Age of Onset  . Stroke Father 29  . Cancer Father     colon  . Lupus Daughter     Review of Systems: Positive: Not applicableNegative: .  A further 10 point review of systems was negative except what is listed in the HPI.                  Physical Exam: @VITALS2 @ General: No acute distress.   Awake. Head:  Normocephalic.  Atraumatic. ENT:  EOMI.  Mucous membranes moist Neck:  Supple.  No lymphadenopathy. CV:  S1 present. S2 present. Regular rate. Pulmonary: Equal effort bilaterally.  Clear to auscultation bilaterally. Abdomen: Soft.  Non-tender to palpation. Skin:  Normal turgor.  No visible rash. Extremity: No gross deformity of bilateral upper extremities.  No gross deformity of                             lower extremities. Neurologic: Alert. Appropriate mood.   Studies:  Recent Labs     01/13/14  1047  HGB  10.9*    Recent Labs     01/13/14  1047  NA  145  K  4.5  CL  117*  BUN  56*  CREATININE  3.60*     No results for input(s): INR, APTT in the last 72 hours.  Invalid input(s): PT   Invalid input(s): ABG  Assessment:  Bilateral ureteral strictures  Plan: Bilateral J2 stent exchanges

## 2014-01-13 NOTE — Anesthesia Preprocedure Evaluation (Addendum)
Anesthesia Evaluation  Patient identified by MRN, date of birth, ID band Patient awake    Reviewed: Allergy & Precautions, H&P , NPO status , Patient's Chart, lab work & pertinent test results  History of Anesthesia Complications (+) DIFFICULT AIRWAY  Airway Mallampati: IV  TM Distance: <3 FB Neck ROM: Full  Mouth opening: Limited Mouth Opening  Dental no notable dental hx. (+) Teeth Intact, Dental Advisory Given   Pulmonary neg pulmonary ROS, former smoker,  breath sounds clear to auscultation  Pulmonary exam normal       Cardiovascular Exercise Tolerance: Good negative cardio ROS  Rhythm:Regular Rate:Normal     Neuro/Psych negative neurological ROS  negative psych ROS   GI/Hepatic Neg liver ROS, Colon cancer 2007, recurrent [id number]   History of colon cancer, stage IV, Stage 4, [id number] (Chem last Feb12/ Radiation-last Jan12   Endo/Other  negative endocrine ROS  Renal/GU Renal Insufficiency and CRFRenal diseaseCRT 3.8 BUN 53  negative genitourinary   Musculoskeletal negative musculoskeletal ROS (+)   Abdominal   Peds  Hematology negative hematology ROS (+) anemia ,   Anesthesia Other Findings Marked overbite with diminished oral radius.  Reproductive/Obstetrics negative OB ROS                          Anesthesia Physical Anesthesia Plan  ASA: III  Anesthesia Plan: General   Post-op Pain Management:    Induction: Intravenous  Airway Management Planned: LMA  Additional Equipment:   Intra-op Plan:   Post-operative Plan:   Informed Consent: I have reviewed the patients History and Physical, chart, labs and discussed the procedure including the risks, benefits and alternatives for the proposed anesthesia with the patient or authorized representative who has indicated his/her understanding and acceptance.   Dental Advisory Given  Plan Discussed with: CRNA and  Surgeon  Anesthesia Plan Comments:         Anesthesia Quick Evaluation

## 2014-01-16 ENCOUNTER — Encounter (HOSPITAL_BASED_OUTPATIENT_CLINIC_OR_DEPARTMENT_OTHER): Payer: Self-pay | Admitting: Urology

## 2014-02-16 ENCOUNTER — Other Ambulatory Visit (HOSPITAL_COMMUNITY): Payer: Managed Care, Other (non HMO)

## 2014-03-02 ENCOUNTER — Other Ambulatory Visit: Payer: Self-pay | Admitting: Urology

## 2014-03-02 ENCOUNTER — Ambulatory Visit (HOSPITAL_COMMUNITY)
Admission: RE | Admit: 2014-03-02 | Discharge: 2014-03-02 | Disposition: A | Discharge status: Home / Self Care | Payer: Medicare Other | Source: Ambulatory Visit | Attending: Urology | Admitting: Urology

## 2014-03-02 DIAGNOSIS — Z85038 Personal history of other malignant neoplasm of large intestine: Secondary | ICD-10-CM | POA: Insufficient documentation

## 2014-03-02 DIAGNOSIS — Z436 Encounter for attention to other artificial openings of urinary tract: Secondary | ICD-10-CM | POA: Insufficient documentation

## 2014-03-02 DIAGNOSIS — N1330 Unspecified hydronephrosis: Secondary | ICD-10-CM

## 2014-03-02 DIAGNOSIS — N135 Crossing vessel and stricture of ureter without hydronephrosis: Secondary | ICD-10-CM | POA: Diagnosis not present

## 2014-03-02 MED ORDER — IOHEXOL 300 MG/ML  SOLN
20.0000 mL | Freq: Once | INTRAMUSCULAR | Status: AC | PRN
  Administered 2014-03-02: 10 mL

## 2014-03-02 NOTE — Procedures (Signed)
Procedure:  Bilateral PCN exchange Findings:  New 10 Fr PCN's placed and formed in renal pelvis.  Attached to gravity bags.

## 2014-03-07 DIAGNOSIS — R339 Retention of urine, unspecified: Secondary | ICD-10-CM | POA: Diagnosis not present

## 2014-03-22 ENCOUNTER — Telehealth: Payer: Self-pay | Admitting: Hematology

## 2014-04-14 DIAGNOSIS — D649 Anemia, unspecified: Secondary | ICD-10-CM | POA: Diagnosis not present

## 2014-04-14 DIAGNOSIS — N184 Chronic kidney disease, stage 4 (severe): Secondary | ICD-10-CM | POA: Diagnosis not present

## 2014-04-14 DIAGNOSIS — N2581 Secondary hyperparathyroidism of renal origin: Secondary | ICD-10-CM | POA: Diagnosis not present

## 2014-04-20 DIAGNOSIS — N179 Acute kidney failure, unspecified: Secondary | ICD-10-CM | POA: Diagnosis not present

## 2014-04-20 DIAGNOSIS — N184 Chronic kidney disease, stage 4 (severe): Secondary | ICD-10-CM | POA: Diagnosis not present

## 2014-04-20 DIAGNOSIS — D631 Anemia in chronic kidney disease: Secondary | ICD-10-CM | POA: Diagnosis not present

## 2014-04-20 DIAGNOSIS — N139 Obstructive and reflux uropathy, unspecified: Secondary | ICD-10-CM | POA: Diagnosis not present

## 2014-04-21 DIAGNOSIS — H3531 Nonexudative age-related macular degeneration: Secondary | ICD-10-CM | POA: Diagnosis not present

## 2014-04-27 ENCOUNTER — Ambulatory Visit (HOSPITAL_COMMUNITY)
Admission: RE | Admit: 2014-04-27 | Discharge: 2014-04-27 | Disposition: A | Discharge status: Home / Self Care | Payer: Medicare Other | Source: Ambulatory Visit | Attending: Interventional Radiology | Admitting: Interventional Radiology

## 2014-04-27 ENCOUNTER — Other Ambulatory Visit: Payer: Self-pay | Admitting: Urology

## 2014-04-27 ENCOUNTER — Encounter (HOSPITAL_COMMUNITY): Payer: PRIVATE HEALTH INSURANCE

## 2014-04-27 DIAGNOSIS — C189 Malignant neoplasm of colon, unspecified: Secondary | ICD-10-CM | POA: Diagnosis not present

## 2014-04-27 DIAGNOSIS — Z436 Encounter for attention to other artificial openings of urinary tract: Secondary | ICD-10-CM | POA: Diagnosis not present

## 2014-04-27 DIAGNOSIS — N135 Crossing vessel and stricture of ureter without hydronephrosis: Secondary | ICD-10-CM | POA: Insufficient documentation

## 2014-04-27 DIAGNOSIS — N1330 Unspecified hydronephrosis: Secondary | ICD-10-CM

## 2014-04-27 MED ORDER — IOHEXOL 300 MG/ML  SOLN
20.0000 mL | Freq: Once | INTRAMUSCULAR | Status: AC | PRN
  Administered 2014-04-27: 10 mL

## 2014-04-27 NOTE — Procedures (Signed)
percutaneous nephrostomy exchange under fluoro No complication No blood loss. See complete dictation in [facility].

## 2014-05-01 ENCOUNTER — Other Ambulatory Visit: Payer: Managed Care, Other (non HMO)

## 2014-05-01 ENCOUNTER — Ambulatory Visit: Payer: Managed Care, Other (non HMO)

## 2014-05-02 ENCOUNTER — Other Ambulatory Visit (HOSPITAL_COMMUNITY): Payer: Self-pay | Admitting: *Deleted

## 2014-05-02 DIAGNOSIS — N135 Crossing vessel and stricture of ureter without hydronephrosis: Secondary | ICD-10-CM | POA: Diagnosis not present

## 2014-05-02 DIAGNOSIS — R339 Retention of urine, unspecified: Secondary | ICD-10-CM | POA: Diagnosis not present

## 2014-05-03 ENCOUNTER — Inpatient Hospital Stay (HOSPITAL_COMMUNITY)
Admission: RE | Admit: 2014-05-03 | Discharge: 2014-05-03 | Disposition: A | Discharge status: Home / Self Care | Payer: PRIVATE HEALTH INSURANCE | Source: Ambulatory Visit | Attending: Nephrology | Admitting: Nephrology

## 2014-05-03 MED ORDER — SODIUM CHLORIDE 0.9 % IV SOLN
510.0000 mg | INTRAVENOUS | Status: DC
  Administered 2014-05-03: 510 mg via INTRAVENOUS
  Filled 2014-05-03: qty 17

## 2014-05-03 NOTE — Discharge Instructions (Signed)

## 2014-05-05 ENCOUNTER — Other Ambulatory Visit: Payer: Self-pay | Admitting: *Deleted

## 2014-05-05 DIAGNOSIS — C19 Malignant neoplasm of rectosigmoid junction: Secondary | ICD-10-CM

## 2014-05-08 ENCOUNTER — Telehealth: Payer: Self-pay | Admitting: Hematology

## 2014-05-08 ENCOUNTER — Ambulatory Visit (HOSPITAL_BASED_OUTPATIENT_CLINIC_OR_DEPARTMENT_OTHER): Payer: Medicare Other | Admitting: Hematology

## 2014-05-08 ENCOUNTER — Other Ambulatory Visit (HOSPITAL_BASED_OUTPATIENT_CLINIC_OR_DEPARTMENT_OTHER): Payer: Medicare Other

## 2014-05-08 VITALS — BP 130/70 | HR 70 | Temp 97.9°F | Resp 18 | Ht 69.0 in | Wt 158.8 lb

## 2014-05-08 DIAGNOSIS — C19 Malignant neoplasm of rectosigmoid junction: Secondary | ICD-10-CM | POA: Diagnosis not present

## 2014-05-08 DIAGNOSIS — D631 Anemia in chronic kidney disease: Secondary | ICD-10-CM

## 2014-05-08 DIAGNOSIS — C772 Secondary and unspecified malignant neoplasm of intra-abdominal lymph nodes: Secondary | ICD-10-CM

## 2014-05-08 DIAGNOSIS — N184 Chronic kidney disease, stage 4 (severe): Secondary | ICD-10-CM | POA: Diagnosis not present

## 2014-05-08 LAB — CBC WITH DIFFERENTIAL/PLATELET
BASO%: 0.7 % (ref 0.0–2.0)
BASOS ABS: 0 10*3/uL (ref 0.0–0.1)
EOS ABS: 0.2 10*3/uL (ref 0.0–0.5)
EOS%: 4.8 % (ref 0.0–7.0)
HCT: 33.3 % — ABNORMAL LOW (ref 38.4–49.9)
HEMOGLOBIN: 10.8 g/dL — AB (ref 13.0–17.1)
LYMPH#: 0.4 10*3/uL — AB (ref 0.9–3.3)
LYMPH%: 7.9 % — ABNORMAL LOW (ref 14.0–49.0)
MCH: 29.9 pg (ref 27.2–33.4)
MCHC: 32.3 g/dL (ref 32.0–36.0)
MCV: 92.7 fL (ref 79.3–98.0)
MONO#: 0.5 10*3/uL (ref 0.1–0.9)
MONO%: 9.9 % (ref 0.0–14.0)
NEUT%: 76.7 % — ABNORMAL HIGH (ref 39.0–75.0)
NEUTROS ABS: 3.6 10*3/uL (ref 1.5–6.5)
Platelets: 256 10*3/uL (ref 140–400)
RBC: 3.6 10*6/uL — ABNORMAL LOW (ref 4.20–5.82)
RDW: 14.2 % (ref 11.0–14.6)
WBC: 4.6 10*3/uL (ref 4.0–10.3)

## 2014-05-08 LAB — COMPREHENSIVE METABOLIC PANEL (CC13)
ALBUMIN: 3.5 g/dL (ref 3.5–5.0)
ALT: 14 U/L (ref 0–55)
AST: 14 U/L (ref 5–34)
Alkaline Phosphatase: 57 U/L (ref 40–150)
Anion Gap: 11 mEq/L (ref 3–11)
BUN: 45.6 mg/dL — AB (ref 7.0–26.0)
CALCIUM: 9.2 mg/dL (ref 8.4–10.4)
CO2: 18 mEq/L — ABNORMAL LOW (ref 22–29)
Chloride: 116 mEq/L — ABNORMAL HIGH (ref 98–109)
Creatinine: 3.3 mg/dL (ref 0.7–1.3)
EGFR: 18 mL/min/{1.73_m2} — AB (ref >90)
GLUCOSE: 87 mg/dL (ref 70–140)
Potassium: 4.3 mEq/L (ref 3.5–5.1)
Sodium: 145 mEq/L (ref 136–145)
Total Bilirubin: 0.21 mg/dL (ref 0.20–1.20)
Total Protein: 6.6 g/dL (ref 6.4–8.3)

## 2014-05-08 NOTE — Progress Notes (Signed)
[facility] ONCOLOGY OFFICE PROGRESS NOTE DATE OF VISIT: 10/31/2013  Tawanna Solo, MD [place] [address]  DIAGNOSIS: Rectosigmoid cancer, stage IV - Plan: NM PET Image Initial (PI) Whole Body, CBC & Diff and Retic, Comprehensive metabolic panel (Cmet) - [facility], [facility]  Chief Complaint  Patient presents with  . Follow-up    Colon Ca    CURRENT THERAPY:  Observation.  INTERVAL HISTORY:  Eric Munoz 74 y.o. 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 He was last seen by Dr Lona Kettle 6 month ago. He is clinically doing very well. He denies any pain, abdominal discomfort, nausea, change of bowel habit, or any other new symptoms. He has good energy level and appetite. His weight is stable.   MEDICAL HISTORY: Past Medical History  Diagnosis Date  . Urinoma, s/p bladder repair   . History of acute renal failure nov 2012  . Colovesical fistula   . S/P ileostomy     12-20-2010  . Foley catheter in place   . Nephrostomy status     CURRENTLY HAS BILATERAL TUBES w/ changes every 6 weeks  . History of colon cancer, stage IV     2007 w/ recurrence july 2011--  RECTOSIGMOID [id number] s/p left coloectomy/ lar--  chemo complete feb 2012;  rxt complete jan 2012----   . Iron deficiency anemia   . Ureteral stricture     bilateral--  bilateral ureteral stents w/  changes every 6 months approx.  . Chronic kidney disease (CKD), stage III (moderate) SECONDARY UNCLEAR ETIOLOGY, POSSIBLE RELATED RADIATION THERAPY      NEPHROLOGIST,  DR Marval Regal  . Acute-on-chronic kidney injury     SECONDARY TO OBSTRUCTIVE UROPATHY--  BILATERAL URETERAL STENTS, BILATERAL NEPHROSTOMY TUBES AND FOLEY CATHETER  . Rectosigmoid cancer     stage iv--  no recurrent mestastatic disease--  oncologist--  dr Shanon Brow chism/ dr Bernadene Bell ([facility])  . Difficult intubation NOTED ASA IV W/ MAY 2013 AT [facility] MAIN OR--  INTUBATED OVER BOUGIE  BY DR FORTUNE    PT DID OK W/ LMA AT [facility] 10-24-2011  -- DR ROSE &  dr fortune at [facility] 02-18-2013    INTERIM HISTORY: has History of adenomatous polyp of rectum s/p low LAR; History of colon cancer, stage IV, Stage 4, [id number] (Chem last Feb12/ Radiation-last Jan12); Ureteral stricture, left; Loop diverting ileostomy, [id number]; Colourethral fistula ; Renal insufficiency; Unspecified deficiency anemia; Tachycardia; UTI (lower urinary tract infection); Acute renal failure in the setting of indwelling ureteral stents and bilateral hydronephrosis; Hyperkalemia; Metabolic acidosis; Normocytic anemia; Rectosigmoid cancer, stage IV; Pyonephrosis; and Hypokalemia on his problem list.    ALLERGIES:  has No Known Allergies.  MEDICATIONS: has a current medication list which includes the following prescription(s): multivitamin with minerals.  SURGICAL HISTORY:  Past Surgical History  Procedure Laterality Date  . Portacath placement  09-17-2009  . Colostomy takedown  12/20/2010    Procedure: LAPAROSCOPIC COLOSTOMY TAKEDOWN;  Surgeon: Adin Hector, MD;  Location: [facility];  Service: General;  Laterality: N/A;  Laparoscopy Colostomy Takedown With Ileostomy  . Port-a-cath removal  12/20/2010    Procedure: REMOVAL PORT-A-CATH;  Surgeon: Adin Hector, MD;  Location: [facility];  Service: General;  Laterality: Right;  Right  venous catheter no longer needed.  . Cystoscopy w/ ureteral stent placement  01/13/2011    Procedure: CYSTOSCOPY WITH RETROGRADE PYELOGRAM/URETERAL STENT PLACEMENT;  Surgeon: Franchot Gallo;  Location: [facility];  Service: Urology;  Laterality: Left;  intra-operative cystogram  . Tonsillectomy  yrs ago  . Fracture surgery  1950'S    RIGHT ARM SURGERY FOR FX  . Examination under anesthesia  06/19/2011    Procedure: EXAM UNDER ANESTHESIA;  Surgeon: Adin Hector, MD;  Location: [facility];   Service: General;;  . Cystoscopy w/ ureteral stent placement  06/19/2011    Procedure: CYSTOSCOPY WITH RETROGRADE PYELOGRAM/URETERAL STENT PLACEMENT;  Surgeon: Franchot Gallo, MD;  Location: [facility];  Service: Urology;  Laterality: Bilateral;  Cystoscopy/Bilateral Double J Stent insertion , flexible cystoscopy   . Cystoscopy w/ ureteral stent placement  10/24/2011    Procedure: CYSTOSCOPY WITH STENT REPLACEMENT;  Surgeon: Franchot Gallo, MD;  Location: [facility];  Service: Urology;  Laterality: Bilateral;  45 mins requested for this case   . Cystoscopy w/ retrogrades  10/24/2011    Procedure: CYSTOSCOPY WITH RETROGRADE PYELOGRAM;  Surgeon: Franchot Gallo, MD;  Location: [facility];  Service: Urology;  Laterality: Left;  . Transanal resection of rectal polyp and sigmoid colectomy  03/15/2004  . Left hydrocele surgery  03/16/2009  . Cystoscopy w/ ureteral stent placement Bilateral 04/09/2012    Procedure: CYSTOSCOPY WITH bilateral  STENT REPLACEMENT;  Surgeon: Franchot Gallo, MD;  Location: [facility];  Service: Urology;  Laterality: Bilateral;  . Cysto/ left retrograde pyelogram/ left stent placement  03-01-2010  &  09-13-2010  . Multiple nephrostomy tube changes  every six weeks  . Colon surgery  08/07/09    Extensive lysis adhesions/ Re-do Low Anterior Rescection, Reconstruction left external iliac artery/ Left ureterolysis and ureter disposition  . Cystoscopy w/ ureteral stent placement Bilateral 12/13/2012    Procedure: CYSTOSCOPY WITH STENT REPLACEMENT;  Surgeon: Franchot Gallo, MD;  Location: [facility];  Service: Urology;  Laterality: Bilateral;  . Cystoscopy with ureteroscopy and stent placement Bilateral 02/18/2013    Procedure: CYSTOSCOPY WITH URETEROSCOPY AND BILATERAL STENT EXCHANGE;  Surgeon: Franchot Gallo, MD;  Location: [facility];  Service: Urology;  Laterality: Bilateral;  . Cystoscopy w/  ureteral stent placement Bilateral 01/13/2014    Procedure: CYSTOSCOPY WITH STENT REPLACEMENT;  Surgeon: Jorja Loa, MD;  Location: [facility];  Service: Urology;  Laterality: Bilateral;   PROBLEM LIST: 1. Adenocarcinoma of the rectosigmoid stage IVA, T4b N2a M1a cancer involving with involvement of periaortic lymph node. Diagnosis was established in July of 2011. The patient underwent rather extensive surgery on both July 5th and August 08, 2009. There were multiple tumor nodules. Six out of 26 lymph nodes were involved along with adjacent small bowel. There was involvement of a periaortic lymph node. There was a close radial margin. There was tumor thrombus in a vein. There was extracapsular extension, lymphovascular and perineural invasion, invasion of tumor into the left external iliac artery and left ureter. KRAS mutation was not detected, and thus the tumor is KRAS wild type. Surgery involved a colostomy and placement of a left ureteral stent. There was a question of whether this represented a de novo cancer or possibly recurrence from a previous sigmoid well-differentiated adenocarcinoma that was resected by Dr. Georganna Skeans on 03/15/2004. This tumor arose apparently in a polyp with 0/3 lymph nodes and was felt to be T1 stage I. The margin, however, was 0.5 mm. After the patient's surgery in July 2011, he received 7 cycles  of FOLFOX from 09/26/2009 through 12/24/2009. The patient received pelvic radiation with continuous infusion 5-fluorouracil from 01/07/2010 through 02/15/2009. The patient then received 3 cycles of 5-FU leucovorin and 5-FU by continuous infusion from 03/04/2010 through 04/01/2010. 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 PET scan carried out on 06/16/2011 showed no evidence of recurrent or metastatic disease.  2. Ileostomy, 12/20/2010.  3. Right leg weakness secondary to femoral nerve injuries following  the patient's surgery in July  2011. Currently resolved.  4. The patient underwent extensive surgery on December 20, 2010. This consisted of lysis of adhesions, laparoscopic-assisted splenic flexure mobilization, laparoscopic colostomy takedown, primary repair of bladder injury, diverting loop ileostomy in the right upper quadrant, and removal of the patient's Port-A-Cath.  5. Bilateral ureteral stents, most recently exchanged this month on 03/01/2013 by Dr. Diona Fanti.  6. 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  7. Progressive weight loss.  8. Anemia noted in January 2013 with a prior history of iron  deficiency anemia requiring intravenous Feraheme 510 mg  on 09/26/2009 and on 10/10/2009. Renal insufficiency is probably contributing to this patient's anemia at the present time.  9. Development of renal insufficiency noted in May 2013 with acute decompensation noted on 11/04/2011.  10. Bilateral percutaneous nephrostomies carried out on January 10, 2012 for  bilateral hydronephrosis. These tubes were removed in April 2014 but  needed to be reinserted on 06/15/2012 because of renal decompensation.   REVIEW OF SYSTEMS:   Constitutional: Denies fevers, chills or abnormal weight loss Eyes: Denies blurriness of vision Ears, nose, mouth, throat, and face: Denies mucositis or sore throat Respiratory: Denies cough, dyspnea or wheezes Cardiovascular: Denies palpitation, chest discomfort or lower extremity swelling Gastrointestinal:  Denies nausea, heartburn or change in bowel habits Skin: Denies abnormal skin rashes Lymphatics: Denies new lymphadenopathy or easy bruising Neurological:Denies numbness, tingling or new weaknesses Behavioral/Psych: Mood is stable, no new changes  All other systems were reviewed with the patient and are negative.  PHYSICAL EXAMINATION: ECOG PERFORMANCE STATUS: 0-Asymptomatic  Blood pressure 130/70, pulse 70, temperature 97.9 F (36.6 C), temperature source Oral, resp. rate 18, height  5' 9"  (1.753 m), weight 158 lb 12.8 oz (72.031 kg).  GENERAL:alert, no distress and comfortable thin middle-aged male SKIN: skin color, texture, turgor are normal, no rashes or significant lesions EYES: normal, Conjunctiva are pink and non-injected, sclera clear OROPHARYNX:no exudate, no erythema and lips, buccal mucosa, and tongue normal  NECK: supple, thyroid normal size, non-tender, without nodularity LYMPH:  no palpable lymphadenopathy in the cervical, axillary or supraclavicular LUNGS: clear to auscultation and percussion with normal breathing effort HEART: regular rate & rhythm and no murmurs and one plus pretibial edema (left greater than right) ABDOMEN:abdomen soft, non-tender and normal bowel sounds; ileostomy bag in right mid abdomen; he does also have bilateral nephrostomy tubes in place. Also has a Foley catheter with urine that is clear. Musculoskeletal:no cyanosis of digits and no clubbing  NEURO: alert & oriented x 3 with fluent speech, no focal motor/sensory deficits   LABORATORY DATA: CBC Latest Ref Rng 05/08/2014 01/13/2014 10/31/2013  WBC 4.0 - 10.3 10e3/uL 4.6 - 5.0  Hemoglobin 13.0 - 17.1 g/dL 10.8(L) 10.9(L) 10.9(L)  Hematocrit 38.4 - 49.9 % 33.3(L) 32.0(L) 33.7(L)  Platelets 140 - 400 10e3/uL 256 - 250    CMP Latest Ref Rng 05/08/2014 01/13/2014 10/31/2013  Glucose 70 - 140 mg/dl 87 86 76  BUN 7.0 - 26.0 mg/dL 45.6(H) 56(H) 47.2(H)  Creatinine 0.7 - 1.3 mg/dL 3.3(HH) 3.60(H) 2.9(H)  Sodium 136 - 145 mEq/L 145 145 143  Potassium 3.5 - 5.1 mEq/L 4.3 4.5 4.3  Chloride 96 - 112 mEq/L - 117(H) -  CO2 22 - 29 mEq/L 18(L) - 20(L)  Calcium 8.4 - 10.4 mg/dL 9.2 - 9.6  Total Protein 6.4 - 8.3 g/dL 6.6 - 6.8  Total Bilirubin 0.20 - 1.20 mg/dL 0.21 - 0.24  Alkaline Phos 40 - 150 U/L 57 - 55  AST 5 - 34 U/L 14 - 14  ALT 0 - 55 U/L 14 - 12    CEA (Order [id number])      CEA  Status: Finalresult Visible to patient:  Not Released Nextappt: 05/23/2014 at 09:00 AM in  Radiology ([facility] PET) Dx:  Rectosigmoid cancer, stage IV           Ref Range 5d ago  75moago  128mogo  [id number]     CEA 0.0 - 5.0 ng/mL 2.2 1.4 2.0 1.7         RADIOGRAPHIC STUDIES: PET 03/18/2013 NUCLEAR MEDICINE PET SKULL BASE TO THIGH FASTING BLOOD GLUCOSE: Value: 69 mg/dl  TECHNIQUE: 8.4 mCi F-18 FDG was injected intravenously. Full-ring PET imaging was performed from the skull base to thigh after the radiotracer. CT data was obtained and used for attenuation correction and anatomic localization. COMPARISON: SP [place] dated 06/15/2012; NM PET IMAGE RESTAG  (PS) SKULL BASE TO THIGH dated 06/16/2011; NM PET IMAGE RESTAG (PS) SKULL BASE TO THIGH dated 11/29/2010 FINDINGS: NECK No hypermetabolic cervical lymph nodes are identified.There are no  lesions of the pharyngeal mucosal space. There is stable nodularity of the thyroid gland without associated abnormal metabolic activity. CHEST There are no hypermetabolic mediastinal, hilar or axillary lymph nodes. There is no hypermetabolic pulmonary activity. The lungs are clear. Mild emphysematous changes are present. ABDOMEN/PELVIS There is no hypermetabolic activity within the liver, adrenal  glands, spleen or pancreas. There is no hypermetabolic nodal activity. Patient is status post subtotal colectomy and ileostomy. There are bilateral percutaneous nephroureteral catheters. The distal ends of both catheters are within the bladder, although the distal left pigtail loop is unformed. The bladder is decompressed by a Foley catheter. There is activity within the ureters bilaterally which remain mildly dilated, but stable. The prostate gland appears grossly stable. The presacral soft tissue process has partially contracted and demonstrates no abnormal metabolic activity. SKELETON There is no hypermetabolic activity to suggest osseous metastatic disease. IMPRESSION: 1. No evidence of local recurrence or metastatic disease. 2. There is no  abnormal activity within the presacral fibrotic process. 3. Bilateral nephroureteral catheters remain in place. The distal end of the left pigtail catheter is straightened within the bladder.  ASSESSMENT: Eric Munoz 72o. male with a history of stage 4 colon cancer in remission.   1. Stage IV rectosigmoid adenocarcinoma with periaortic lymph nodes metastasis, in remission -He is clinically doing very well without any symptoms. His physical exam today was normal. -His last PET scan was in February 2015, I recommend to have a restaging PET in the next few weeks. -His CEA continues to be normal -I'll call him after his PET scan result returns. -Continue surveillance if no evidence of recurrence on PET scan.  2. Anemia, secondary to CKD  -His anemia is stable, hemoglobin 10.8 today. -We'll continue monitoring.  3. CKD, stage IV -He will continue to follow-up with his urologist and have nephrostomy tube exchange periodically.  Follow-up return to clinic In 6 months if his restaging PET scan is  negative.  I spent 20 minutes counseling the patient face to face. The total time spent in the appointment was 25 minutes.  Truitt Merle  05/08/2014

## 2014-05-08 NOTE — Telephone Encounter (Signed)
gave and printed appt sched and avs for pt for April May and OCT

## 2014-05-09 ENCOUNTER — Other Ambulatory Visit (HOSPITAL_COMMUNITY): Payer: Self-pay | Admitting: *Deleted

## 2014-05-09 LAB — CEA: CEA: 2.2 ng/mL (ref 0.0–5.0)

## 2014-05-10 ENCOUNTER — Encounter (HOSPITAL_COMMUNITY)
Admission: RE | Admit: 2014-05-10 | Discharge: 2014-05-10 | Disposition: A | Discharge status: Home / Self Care | Payer: Medicare Other | Source: Ambulatory Visit | Attending: Nephrology | Admitting: Nephrology

## 2014-05-10 DIAGNOSIS — D509 Iron deficiency anemia, unspecified: Secondary | ICD-10-CM | POA: Diagnosis not present

## 2014-05-10 IMAGING — XA IR BILIARY CATHETER EXCHANGE
1 series · 6 of 6 positions shown · non-contrast
Comparison: none

CLINICAL DATA: Bilateral ureteral obstruction and need for
replacement of bilateral percutaneous nephrostomy tubes.

[Series 300: tube placements · 6 of 6 slices shown]
[im 1/6]
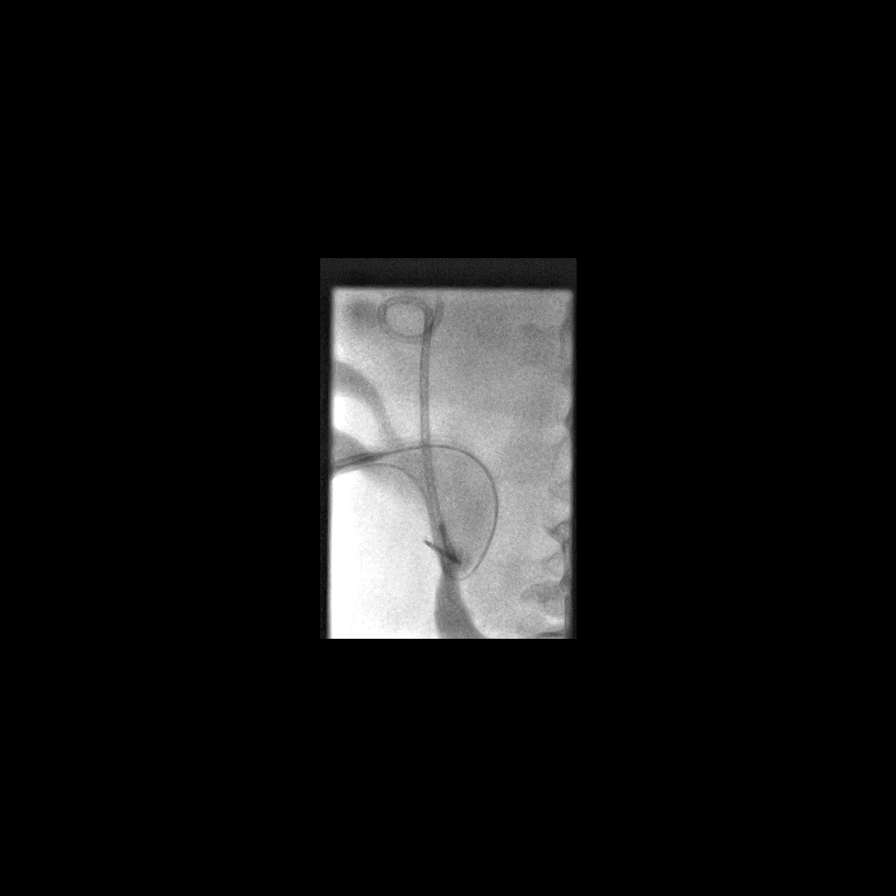
[im 2/6]
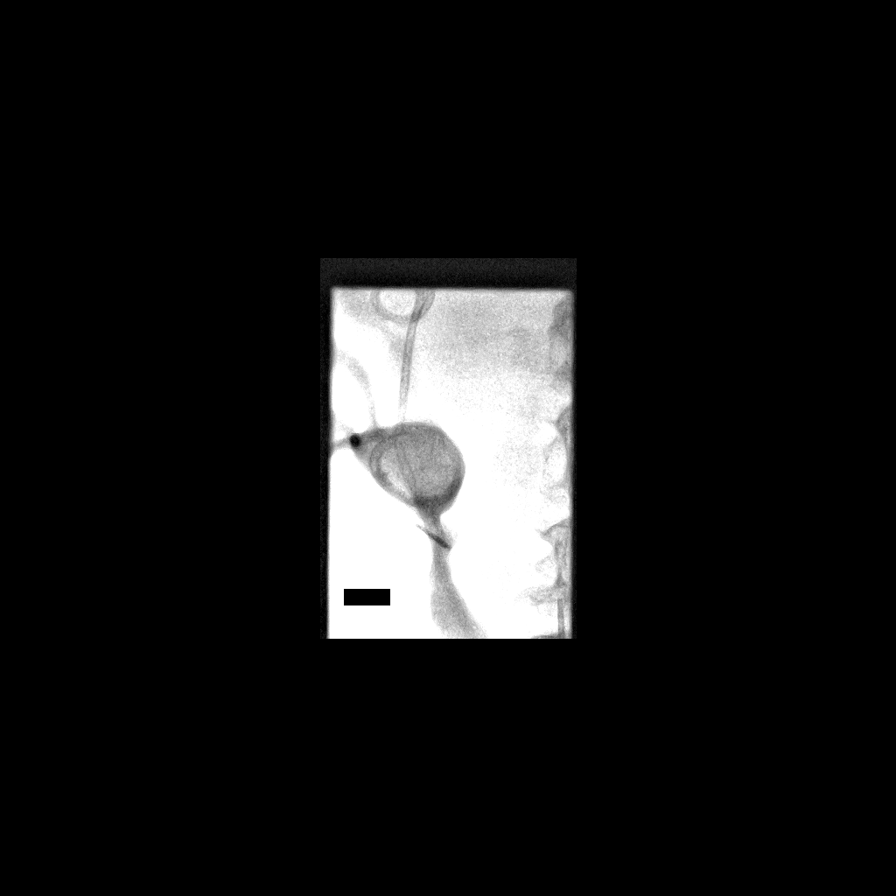
[im 3/6]
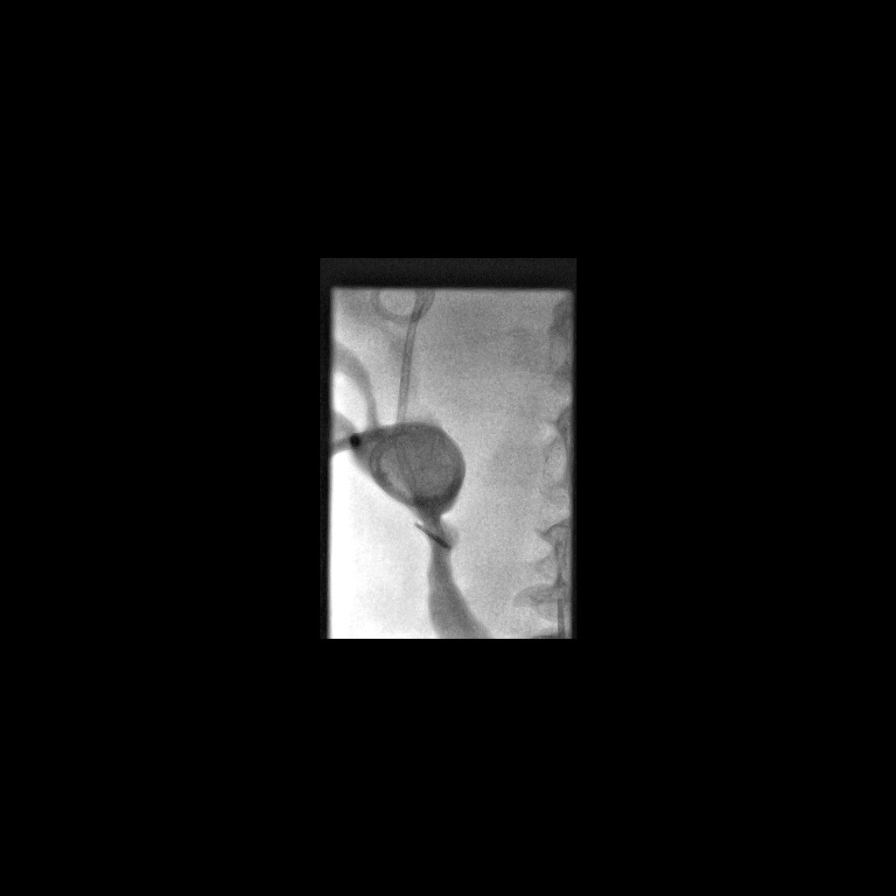
[im 4/6]
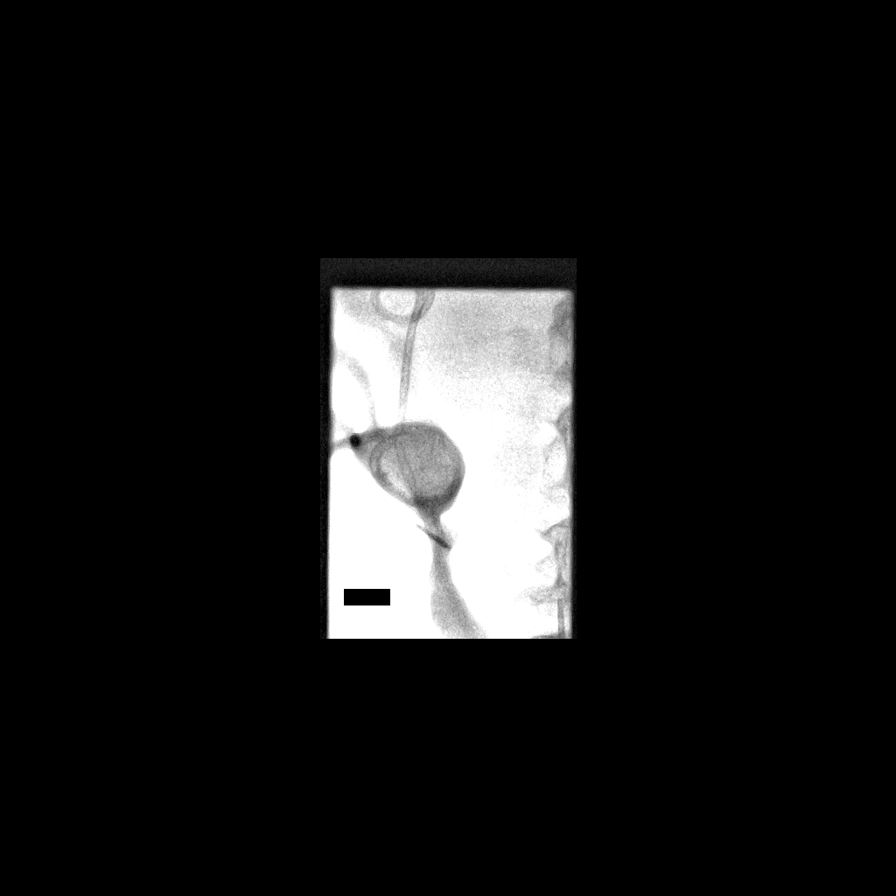
[im 5/6]
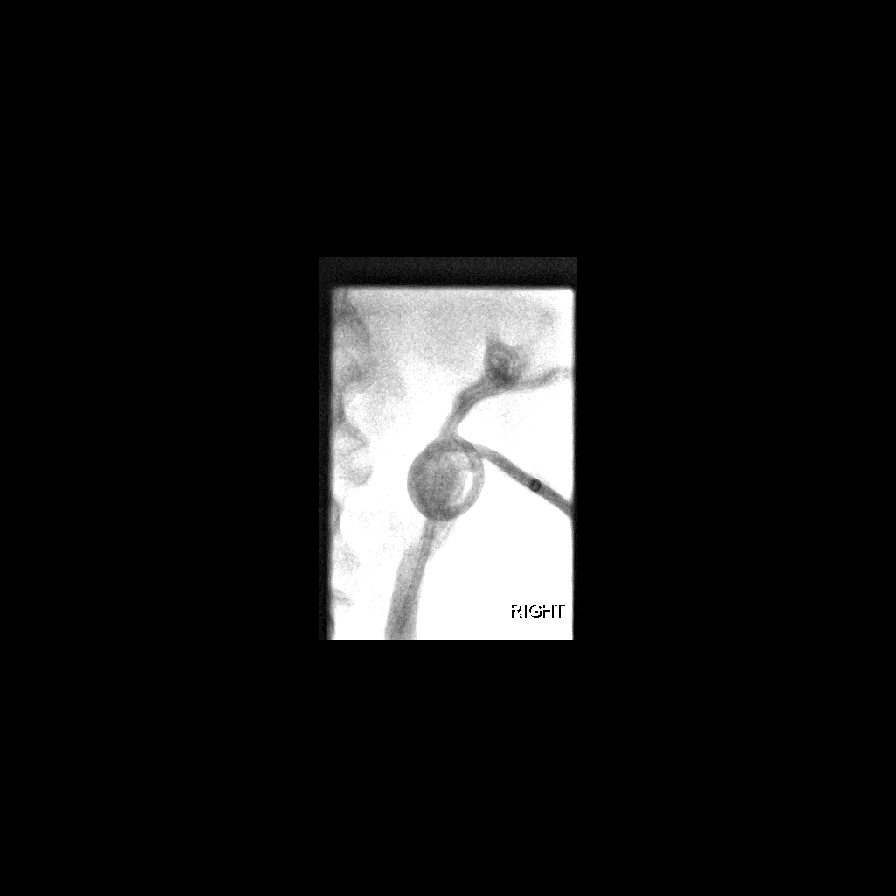
[im 6/6]
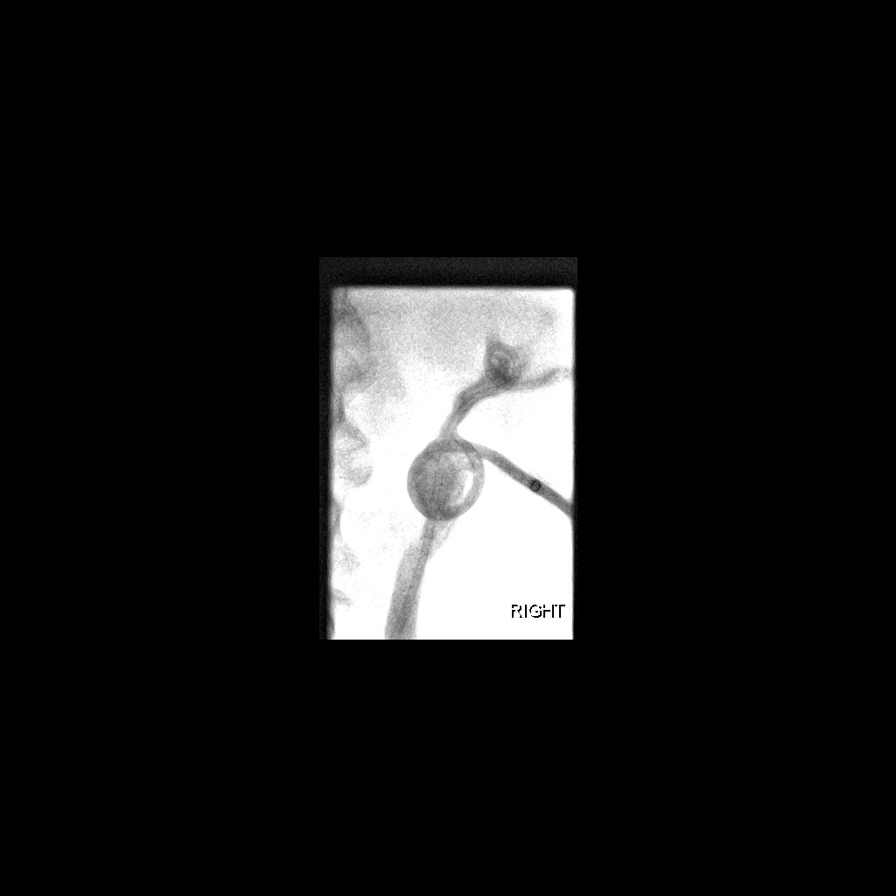

[6 of 6 positions shown; findings below may reference images not displayed]

EXAM:
BILATERAL NEPHROSTOMY TUBE EXCHANGE

CONTRAST:  10 ml [id number]

FLUOROSCOPY TIME:  2 min and 48 seconds.

PROCEDURE:
The procedure, risks, benefits, and alternatives were explained to
the patient. Questions regarding the procedure were encouraged and
answered. The patient understands and consents to the procedure.

Both flank regions and pre-existing nephrostomy tubes were prepped
with Betadine in a sterile fashion, and a sterile drape was applied
covering the operative field. sterile gown and sterile gloves were
used for the procedure.

The preexisting catheters were injected with contrast material under
fluoroscopy. Both were then removed over guidewires. New 10 French
nephrostomy tubes were then advanced over guidewires bilaterally and
formed.

Final catheter position was confirmed with fluoroscopic spot images
obtained after injection of contrast. Both tubes were secured with
Prolene retention sutures.

COMPLICATIONS:
None.
FINDINGS: Bilateral nephrostomy tubes were formed at the level of the renal
pelvis. Bilateral ureteral stents are present.
IMPRESSION: Exchange of pre-existing bilateral 10 French nephrostomy tubes.

## 2014-05-10 MED ORDER — FERUMOXYTOL INJECTION 510 MG/17 ML
510.0000 mg | INTRAVENOUS | Status: DC
  Administered 2014-05-10: 510 mg via INTRAVENOUS
  Filled 2014-05-10: qty 17

## 2014-05-13 ENCOUNTER — Encounter: Payer: Self-pay | Admitting: Hematology

## 2014-05-23 ENCOUNTER — Ambulatory Visit (HOSPITAL_COMMUNITY)
Admission: RE | Admit: 2014-05-23 | Discharge: 2014-05-23 | Disposition: A | Discharge status: Home / Self Care | Payer: Medicare Other | Source: Ambulatory Visit | Attending: Hematology | Admitting: Hematology

## 2014-05-23 DIAGNOSIS — C19 Malignant neoplasm of rectosigmoid junction: Secondary | ICD-10-CM

## 2014-05-23 DIAGNOSIS — C189 Malignant neoplasm of colon, unspecified: Secondary | ICD-10-CM | POA: Diagnosis not present

## 2014-05-23 DIAGNOSIS — Z79899 Other long term (current) drug therapy: Secondary | ICD-10-CM | POA: Diagnosis not present

## 2014-05-23 DIAGNOSIS — I700 Atherosclerosis of aorta: Secondary | ICD-10-CM | POA: Diagnosis not present

## 2014-05-23 LAB — GLUCOSE, CAPILLARY: Glucose-Capillary: 78 mg/dL (ref 70–99)

## 2014-05-23 MED ORDER — FLUDEOXYGLUCOSE F - 18 (FDG) INJECTION
7.9000 | Freq: Once | INTRAVENOUS | Status: AC | PRN
  Administered 2014-05-23: 7.9 via INTRAVENOUS

## 2014-06-04 ENCOUNTER — Telehealth: Payer: Self-pay | Admitting: Hematology

## 2014-06-04 NOTE — Telephone Encounter (Signed)
I called pt and left a message regarding his PET on 05/23/14 which was negative.  Eric Munoz  06/04/2014

## 2014-06-22 ENCOUNTER — Ambulatory Visit (HOSPITAL_COMMUNITY)
Admission: RE | Admit: 2014-06-22 | Discharge: 2014-06-22 | Disposition: A | Discharge status: Home / Self Care | Payer: Medicare Other | Source: Ambulatory Visit | Attending: Urology | Admitting: Urology

## 2014-06-22 ENCOUNTER — Other Ambulatory Visit: Payer: Self-pay | Admitting: Urology

## 2014-06-22 DIAGNOSIS — N360 Urethral fistula: Secondary | ICD-10-CM | POA: Insufficient documentation

## 2014-06-22 DIAGNOSIS — Z85038 Personal history of other malignant neoplasm of large intestine: Secondary | ICD-10-CM | POA: Diagnosis not present

## 2014-06-22 DIAGNOSIS — N135 Crossing vessel and stricture of ureter without hydronephrosis: Secondary | ICD-10-CM | POA: Insufficient documentation

## 2014-06-22 DIAGNOSIS — Z960 Presence of urogenital implants: Secondary | ICD-10-CM | POA: Insufficient documentation

## 2014-06-22 DIAGNOSIS — N1330 Unspecified hydronephrosis: Secondary | ICD-10-CM

## 2014-06-22 DIAGNOSIS — Z436 Encounter for attention to other artificial openings of urinary tract: Secondary | ICD-10-CM | POA: Diagnosis not present

## 2014-06-22 MED ORDER — IOHEXOL 300 MG/ML  SOLN
20.0000 mL | Freq: Once | INTRAMUSCULAR | Status: AC | PRN
  Administered 2014-06-22: 20 mL

## 2014-06-27 DIAGNOSIS — N289 Disorder of kidney and ureter, unspecified: Secondary | ICD-10-CM | POA: Diagnosis not present

## 2014-06-27 DIAGNOSIS — N1330 Unspecified hydronephrosis: Secondary | ICD-10-CM | POA: Diagnosis not present

## 2014-07-31 ENCOUNTER — Other Ambulatory Visit: Payer: Self-pay

## 2014-08-02 DIAGNOSIS — N184 Chronic kidney disease, stage 4 (severe): Secondary | ICD-10-CM | POA: Diagnosis not present

## 2014-08-02 DIAGNOSIS — N2581 Secondary hyperparathyroidism of renal origin: Secondary | ICD-10-CM | POA: Diagnosis not present

## 2014-08-02 DIAGNOSIS — D649 Anemia, unspecified: Secondary | ICD-10-CM | POA: Diagnosis not present

## 2014-08-17 ENCOUNTER — Ambulatory Visit (HOSPITAL_COMMUNITY)
Admission: RE | Admit: 2014-08-17 | Discharge: 2014-08-17 | Disposition: A | Discharge status: Home / Self Care | Payer: Medicare Other | Source: Ambulatory Visit | Attending: Urology | Admitting: Urology

## 2014-08-17 ENCOUNTER — Other Ambulatory Visit: Payer: Self-pay | Admitting: Urology

## 2014-08-17 DIAGNOSIS — N360 Urethral fistula: Secondary | ICD-10-CM | POA: Insufficient documentation

## 2014-08-17 DIAGNOSIS — Z85038 Personal history of other malignant neoplasm of large intestine: Secondary | ICD-10-CM | POA: Insufficient documentation

## 2014-08-17 DIAGNOSIS — Z436 Encounter for attention to other artificial openings of urinary tract: Secondary | ICD-10-CM | POA: Diagnosis not present

## 2014-08-17 DIAGNOSIS — N1330 Unspecified hydronephrosis: Secondary | ICD-10-CM

## 2014-08-17 MED ORDER — IOHEXOL 300 MG/ML  SOLN
20.0000 mL | Freq: Once | INTRAMUSCULAR | Status: AC | PRN
  Administered 2014-08-17: 20 mL

## 2014-08-22 DIAGNOSIS — R339 Retention of urine, unspecified: Secondary | ICD-10-CM | POA: Diagnosis not present

## 2014-08-24 DIAGNOSIS — N179 Acute kidney failure, unspecified: Secondary | ICD-10-CM | POA: Diagnosis not present

## 2014-08-24 DIAGNOSIS — N189 Chronic kidney disease, unspecified: Secondary | ICD-10-CM | POA: Diagnosis not present

## 2014-08-24 DIAGNOSIS — N184 Chronic kidney disease, stage 4 (severe): Secondary | ICD-10-CM | POA: Diagnosis not present

## 2014-08-24 DIAGNOSIS — N139 Obstructive and reflux uropathy, unspecified: Secondary | ICD-10-CM | POA: Diagnosis not present

## 2014-08-24 DIAGNOSIS — D631 Anemia in chronic kidney disease: Secondary | ICD-10-CM | POA: Diagnosis not present

## 2014-08-30 DIAGNOSIS — N1330 Unspecified hydronephrosis: Secondary | ICD-10-CM | POA: Diagnosis not present

## 2014-08-30 DIAGNOSIS — N289 Disorder of kidney and ureter, unspecified: Secondary | ICD-10-CM | POA: Diagnosis not present

## 2014-08-31 ENCOUNTER — Other Ambulatory Visit: Payer: Self-pay | Admitting: Urology

## 2014-09-28 ENCOUNTER — Other Ambulatory Visit (HOSPITAL_COMMUNITY): Payer: PRIVATE HEALTH INSURANCE

## 2014-10-20 DIAGNOSIS — H3531 Nonexudative age-related macular degeneration: Secondary | ICD-10-CM | POA: Diagnosis not present

## 2014-10-26 ENCOUNTER — Encounter (HOSPITAL_BASED_OUTPATIENT_CLINIC_OR_DEPARTMENT_OTHER): Payer: Self-pay | Admitting: *Deleted

## 2014-10-26 ENCOUNTER — Other Ambulatory Visit: Payer: Self-pay | Admitting: Urology

## 2014-10-26 ENCOUNTER — Ambulatory Visit (HOSPITAL_COMMUNITY)
Admission: RE | Admit: 2014-10-26 | Discharge: 2014-10-26 | Disposition: A | Discharge status: Home / Self Care | Payer: Medicare Other | Source: Ambulatory Visit | Attending: Urology | Admitting: Urology

## 2014-10-26 ENCOUNTER — Telehealth (HOSPITAL_COMMUNITY): Payer: Self-pay | Admitting: *Deleted

## 2014-10-26 DIAGNOSIS — N360 Urethral fistula: Secondary | ICD-10-CM | POA: Insufficient documentation

## 2014-10-26 DIAGNOSIS — Z436 Encounter for attention to other artificial openings of urinary tract: Secondary | ICD-10-CM | POA: Insufficient documentation

## 2014-10-26 DIAGNOSIS — N1330 Unspecified hydronephrosis: Secondary | ICD-10-CM

## 2014-10-26 DIAGNOSIS — N135 Crossing vessel and stricture of ureter without hydronephrosis: Secondary | ICD-10-CM | POA: Diagnosis not present

## 2014-10-26 DIAGNOSIS — Z85038 Personal history of other malignant neoplasm of large intestine: Secondary | ICD-10-CM | POA: Insufficient documentation

## 2014-10-26 MED ORDER — LIDOCAINE HCL 1 % IJ SOLN
INTRAMUSCULAR | Status: AC
  Filled 2014-10-26: qty 20

## 2014-10-26 MED ORDER — IOHEXOL 300 MG/ML  SOLN
10.0000 mL | Freq: Once | INTRAMUSCULAR | Status: AC | PRN
  Administered 2014-10-26: 10 mL

## 2014-10-26 NOTE — Procedures (Signed)
Successful exchange of bilateral nephrostomy tubes.  No immediate complication.  No blood loss.  

## 2014-10-26 NOTE — Progress Notes (Signed)
To [facility] at 1200-Hg,Ekg on arrival.Instructed Npo after Mn of solids-clear liquids only until 0600 then Npo.

## 2014-11-03 ENCOUNTER — Encounter (HOSPITAL_BASED_OUTPATIENT_CLINIC_OR_DEPARTMENT_OTHER): Payer: Self-pay | Admitting: *Deleted

## 2014-11-03 ENCOUNTER — Other Ambulatory Visit: Payer: Self-pay

## 2014-11-03 ENCOUNTER — Encounter (HOSPITAL_BASED_OUTPATIENT_CLINIC_OR_DEPARTMENT_OTHER)
Admission: RE | Disposition: A | Discharge status: Home / Self Care | Payer: Self-pay | Source: Ambulatory Visit | Attending: Urology

## 2014-11-03 ENCOUNTER — Ambulatory Visit (HOSPITAL_BASED_OUTPATIENT_CLINIC_OR_DEPARTMENT_OTHER): Payer: Medicare Other | Admitting: Anesthesiology

## 2014-11-03 ENCOUNTER — Ambulatory Visit (HOSPITAL_BASED_OUTPATIENT_CLINIC_OR_DEPARTMENT_OTHER)
Admission: RE | Admit: 2014-11-03 | Discharge: 2014-11-03 | Disposition: A | Discharge status: Home / Self Care | Payer: Medicare Other | Source: Ambulatory Visit | Attending: Urology | Admitting: Urology

## 2014-11-03 DIAGNOSIS — N131 Hydronephrosis with ureteral stricture, not elsewhere classified: Secondary | ICD-10-CM | POA: Insufficient documentation

## 2014-11-03 DIAGNOSIS — Z980 Intestinal bypass and anastomosis status: Secondary | ICD-10-CM | POA: Diagnosis not present

## 2014-11-03 DIAGNOSIS — Z85038 Personal history of other malignant neoplasm of large intestine: Secondary | ICD-10-CM | POA: Insufficient documentation

## 2014-11-03 DIAGNOSIS — Z932 Ileostomy status: Secondary | ICD-10-CM | POA: Diagnosis not present

## 2014-11-03 DIAGNOSIS — N360 Urethral fistula: Secondary | ICD-10-CM | POA: Insufficient documentation

## 2014-11-03 DIAGNOSIS — N183 Chronic kidney disease, stage 3 (moderate): Secondary | ICD-10-CM | POA: Insufficient documentation

## 2014-11-03 DIAGNOSIS — N135 Crossing vessel and stricture of ureter without hydronephrosis: Secondary | ICD-10-CM | POA: Diagnosis present

## 2014-11-03 DIAGNOSIS — N189 Chronic kidney disease, unspecified: Secondary | ICD-10-CM | POA: Diagnosis not present

## 2014-11-03 DIAGNOSIS — N1330 Unspecified hydronephrosis: Secondary | ICD-10-CM | POA: Diagnosis not present

## 2014-11-03 DIAGNOSIS — Z87891 Personal history of nicotine dependence: Secondary | ICD-10-CM | POA: Diagnosis not present

## 2014-11-03 DIAGNOSIS — Z936 Other artificial openings of urinary tract status: Secondary | ICD-10-CM | POA: Insufficient documentation

## 2014-11-03 DIAGNOSIS — D649 Anemia, unspecified: Secondary | ICD-10-CM | POA: Diagnosis not present

## 2014-11-03 DIAGNOSIS — N359 Urethral stricture, unspecified: Secondary | ICD-10-CM | POA: Diagnosis not present

## 2014-11-03 HISTORY — PX: CYSTOSCOPY W/ URETERAL STENT PLACEMENT: SHX1429

## 2014-11-03 LAB — POCT HEMOGLOBIN-HEMACUE: Hemoglobin: 8.6 g/dL — ABNORMAL LOW (ref 13.0–17.0)

## 2014-11-03 SURGERY — CYSTOSCOPY, FLEXIBLE, WITH STENT REPLACEMENT
Anesthesia: General | Site: Renal | Laterality: Bilateral

## 2014-11-03 MED ORDER — SULFAMETHOXAZOLE-TRIMETHOPRIM 800-160 MG PO TABS: 1.0000 | ORAL_TABLET | Freq: Two times a day (BID) | ORAL | Status: DC

## 2014-11-03 MED ORDER — BELLADONNA ALKALOIDS-OPIUM 16.2-60 MG RE SUPP
RECTAL | Status: AC
  Filled 2014-11-03: qty 1

## 2014-11-03 MED ORDER — CEFAZOLIN SODIUM 1-5 GM-% IV SOLN
1.0000 g | INTRAVENOUS | Status: DC
  Filled 2014-11-03: qty 50

## 2014-11-03 MED ORDER — LACTATED RINGERS IV SOLN
INTRAVENOUS | Status: DC
  Administered 2014-11-03: 13:00:00 via INTRAVENOUS
  Filled 2014-11-03: qty 1000

## 2014-11-03 MED ORDER — STERILE WATER FOR IRRIGATION IR SOLN
Status: DC | PRN
  Administered 2014-11-03: 3000 mL

## 2014-11-03 MED ORDER — CEFAZOLIN SODIUM-DEXTROSE 2-3 GM-% IV SOLR
INTRAVENOUS | Status: AC
  Filled 2014-11-03: qty 50

## 2014-11-03 MED ORDER — FENTANYL CITRATE (PF) 100 MCG/2ML IJ SOLN
INTRAMUSCULAR | Status: AC
  Filled 2014-11-03: qty 4

## 2014-11-03 MED ORDER — PROPOFOL 10 MG/ML IV BOLUS
INTRAVENOUS | Status: DC | PRN
  Administered 2014-11-03: 120 mg via INTRAVENOUS
  Administered 2014-11-03: 40 mg via INTRAVENOUS

## 2014-11-03 MED ORDER — LIDOCAINE HCL (CARDIAC) 20 MG/ML IV SOLN
INTRAVENOUS | Status: DC | PRN
  Administered 2014-11-03: 50 mg via INTRAVENOUS

## 2014-11-03 MED ORDER — FENTANYL CITRATE (PF) 100 MCG/2ML IJ SOLN
INTRAMUSCULAR | Status: DC | PRN
  Administered 2014-11-03 (×2): 25 ug via INTRAVENOUS

## 2014-11-03 MED ORDER — CEFAZOLIN SODIUM-DEXTROSE 2-3 GM-% IV SOLR
2.0000 g | INTRAVENOUS | Status: AC
  Administered 2014-11-03: 2 g via INTRAVENOUS
  Filled 2014-11-03: qty 50

## 2014-11-03 SURGICAL SUPPLY — 25 items
ADAPTER CATH URET PLST 4-6FR (CATHETERS) IMPLANT
ADPR CATH URET STRL DISP 4-6FR (CATHETERS)
BAG DRAIN URO-CYSTO SKYTR STRL (DRAIN) ×3 IMPLANT
BAG DRN UROCATH (DRAIN) ×1
CANISTER SUCT LVC 12 LTR MEDI- (MISCELLANEOUS) IMPLANT
CATH INTERMIT  6FR 70CM (CATHETERS) IMPLANT
CLOTH BEACON ORANGE TIMEOUT ST (SAFETY) ×3 IMPLANT
GLOVE BIO SURGEON STRL SZ 6.5 (GLOVE) ×1 IMPLANT
GLOVE BIO SURGEON STRL SZ8 (GLOVE) ×3 IMPLANT
GLOVE BIO SURGEONS STRL SZ 6.5 (GLOVE) ×1
GLOVE BIOGEL PI IND STRL 6.5 (GLOVE) IMPLANT
GLOVE BIOGEL PI INDICATOR 6.5 (GLOVE) ×4
GOWN STRL REUS W/ TWL LRG LVL3 (GOWN DISPOSABLE) ×1 IMPLANT
GOWN STRL REUS W/ TWL XL LVL3 (GOWN DISPOSABLE) ×1 IMPLANT
GOWN STRL REUS W/TWL LRG LVL3 (GOWN DISPOSABLE) ×3
GOWN STRL REUS W/TWL XL LVL3 (GOWN DISPOSABLE) ×3
GUIDEWIRE 0.038 PTFE COATED (WIRE) IMPLANT
GUIDEWIRE ANG ZIPWIRE 038X150 (WIRE) IMPLANT
GUIDEWIRE STR DUAL SENSOR (WIRE) IMPLANT
MANIFOLD NEPTUNE II (INSTRUMENTS) ×2 IMPLANT
NS IRRIG 500ML POUR BTL (IV SOLUTION) IMPLANT
PACK CYSTO (CUSTOM PROCEDURE TRAY) ×3 IMPLANT
STENT CONTOUR 7FRX24 (STENTS) ×2 IMPLANT
STENT CONTOUR 7FRX26 (STENTS) ×2 IMPLANT
WATER STERILE IRR 3000ML UROMA (IV SOLUTION) ×2 IMPLANT

## 2014-11-03 NOTE — H&P (Signed)
Urology History and Physical Exam  CC: bilateral ureteral obstruction  HPI: 74 year old male presents for bilateral double-J stent exchange.  His history is as follows:  His history is as follows:    He had left ureterolysis on 08/07/2009. This was for a large colon cancer that was resected by Dr. Johney Maine. He has had a stent in since that time. He is having no difficulty with the stent. The patient underwent attempted colostomy takedown and reanastomosis. Patient did have a low rectal anastomosis, buT at the time of this procedure, there was a small bladder laceration. He also had new onset right hydronephrosis.    The hydronephrosis was treated with a stent that was placed antegrade. 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 That has been treated with Foley catheter drainage. In February, 2013, he underwent cystogram both by conventional means and with CT that revealed no evidence leakage from his bladder. This catheter was removed, but he had passage of urine through the rectum within a day.     On 06/19/2011, he had cystoscopy, double-J stent exchange, and an aborted fistula repair. Following rectal dilatation, he had his fistula open up significantly. Both Dr. Johney Maine and I felt that, because of this further fistula repair would best be performed by physicians more adept/experienced.    He has seen Dr. Fredderick Phenix as well as a general surgeon at [facility], as well as specialists at the [facility] and [facility]. They have all suggested cystectomy and diversion. He does not want an extirpative procedure at the present time.    He underwent repeat cystoscopy and double-J stent exchange bilaterally on 10/24/2011. That procedure went without difficulty. In followup, he was found to have had an increased creatinine.    The patient ended up having his nephrostomy tubes removed in April/May, 2014. Unfortunately, his creatinine bumped, and he had his  nephrostomy tubes replaced.     On 02/18/2013 he underwent cystoscopy, retrieval of a migrated left double-J stent (pulled out of place during her nephrostomy tube change), and double-J stent replacement. His last double-J stent placement last change was in December 2015.    Recently, he has been found to have creatinine elevation above his baseline. He does get from ostomy tube changes on a regular basis, last time about 2 weeks ago. He has no problems with drainage of the urine from his nephrostomy tubes, although he sometimes has urine in his catheter bag from his bladder    PMH: Past Medical History  Diagnosis Date  . Urinoma, s/p bladder repair   . History of acute renal failure nov 2012  . Colovesical fistula   . S/P ileostomy     12-20-2010  . Foley catheter in place   . Nephrostomy status changed -10/26/2014    CURRENTLY HAS BILATERAL TUBES w/ changes every 6 weeks  . History of colon cancer, stage IV     2007 w/ recurrence july 2011--  RECTOSIGMOID [id number] s/p left coloectomy/ lar--  chemo complete feb 2012;  rxt complete jan 2012----   . Iron deficiency anemia   . Ureteral stricture     bilateral--  bilateral ureteral stents w/  changes every 6 months approx.  . Chronic kidney disease (CKD), stage III (moderate) SECONDARY UNCLEAR ETIOLOGY, POSSIBLE RELATED RADIATION THERAPY      NEPHROLOGIST,  DR Marval Regal  . Acute-on-chronic kidney injury     SECONDARY TO OBSTRUCTIVE UROPATHY--  BILATERAL URETERAL STENTS, BILATERAL NEPHROSTOMY TUBES AND FOLEY  CATHETER  . Rectosigmoid cancer     stage iv--  no recurrent mestastatic disease--  oncologist--  dr Shanon Brow chism/ dr Bernadene Bell ([facility])  . Difficult intubation  ASA IV W/ MAY 2013 AT [facility] MAIN OR--  INTUBATED OVER BOUGIE  BY DR FORTUNE    PT DID OK W/ LMA AT [facility] '13,'14,'15 -DR ROSE &  dr fortune at [facility] 02-18-2013    [place]: Past Surgical History  Procedure Laterality Date  . Portacath placement  09-17-2009  .  Colostomy takedown  12/20/2010    Procedure: LAPAROSCOPIC COLOSTOMY TAKEDOWN;  Surgeon: Adin Hector, MD;  Location: [facility];  Service: General;  Laterality: N/A;  Laparoscopy Colostomy Takedown With Ileostomy  . Port-a-cath removal  12/20/2010    Procedure: REMOVAL PORT-A-CATH;  Surgeon: Adin Hector, MD;  Location: [facility];  Service: General;  Laterality: Right;  Right  venous catheter no longer needed.  . Cystoscopy w/ ureteral stent placement  01/13/2011    Procedure: CYSTOSCOPY WITH RETROGRADE PYELOGRAM/URETERAL STENT PLACEMENT;  Surgeon: Franchot Gallo;  Location: [facility];  Service: Urology;  Laterality: Left;  intra-operative cystogram  . Tonsillectomy  yrs ago  . Fracture surgery  1950'S    RIGHT ARM SURGERY FOR FX  . Examination under anesthesia  06/19/2011    Procedure: EXAM UNDER ANESTHESIA;  Surgeon: Adin Hector, MD;  Location: [facility];  Service: General;;  . Cystoscopy w/ ureteral stent placement  06/19/2011    Procedure: CYSTOSCOPY WITH RETROGRADE PYELOGRAM/URETERAL STENT PLACEMENT;  Surgeon: Franchot Gallo, MD;  Location: [facility];  Service: Urology;  Laterality: Bilateral;  Cystoscopy/Bilateral Double J Stent insertion , flexible cystoscopy   . Cystoscopy w/ ureteral stent placement  10/24/2011    Procedure: CYSTOSCOPY WITH STENT REPLACEMENT;  Surgeon: Franchot Gallo, MD;  Location[facility];  Service: Urology;  Laterality: Bilateral;  45 mins requested for this case   . Cystoscopy w/ retrogrades  10/24/2011    Procedure: CYSTOSCOPY WITH RETROGRADE PYELOGRAM;  Surgeon: Franchot Gallo, MD;  Location: [facility];  Service: Urology;  Laterality: Left;  . Transanal resection of rectal polyp and sigmoid colectomy  03/15/2004  . Left hydrocele surgery  03/16/2009  . Cystoscopy w/ ureteral stent placement Bilateral 04/09/2012    Procedure: CYSTOSCOPY WITH bilateral  STENT REPLACEMENT;  Surgeon: Franchot Gallo, MD;  Location: [facility];  Service: Urology;  Laterality: Bilateral;  . Cysto/ left retrograde pyelogram/ left stent placement  03-01-2010  &  09-13-2010  . Multiple nephrostomy tube changes  every six weeks  . Colon surgery  08/07/09    Extensive lysis adhesions/ Re-do Low Anterior Rescection, Reconstruction left external iliac artery/ Left ureterolysis and ureter disposition  . Cystoscopy w/ ureteral stent placement Bilateral 12/13/2012    Procedure: CYSTOSCOPY WITH STENT REPLACEMENT;  Surgeon: Franchot Gallo, MD;  Location: [facility];  Service: Urology;  Laterality: Bilateral;  . Cystoscopy with ureteroscopy and stent placement Bilateral 02/18/2013    Procedure: CYSTOSCOPY WITH URETEROSCOPY AND BILATERAL STENT EXCHANGE;  Surgeon: Franchot Gallo, MD;  Location: [facility];  Service: Urology;  Laterality: Bilateral;  . Cystoscopy w/ ureteral stent placement Bilateral 01/13/2014    Procedure: CYSTOSCOPY WITH STENT REPLACEMENT;  Surgeon: Jorja Loa, MD;  Location: [facility];  Service: Urology;  Laterality: Bilateral;    Allergies: No Known Allergies  Medications: Prescriptions prior to admission  Medication Sig Dispense Refill Last Dose  . Multiple Vitamin (MULTIVITAMIN WITH  MINERALS) TABS Take 1 tablet by mouth daily.   11/02/2014 at Unknown time     Social History: Social History   Social History  . Marital Status: Married    Spouse Name: N/A  . Number of Children: N/A  . Years of Education: N/A   Occupational History  . Not on file.   Social History Main Topics  . Smoking status: Former Smoker -- 1.00 packs/day for 10 years    Quit date: 02/03/1974  . Smokeless tobacco: Never Used  . Alcohol Use: No  . Drug Use: No  . Sexual Activity: Not on file   Other Topics Concern  . Not on file   Social History Narrative   Married. Lives with wife. Independent of ADLs.    Family History: Family History  Problem Relation Age  of Onset  . Stroke Father 42  . Cancer Father     colon  . Lupus Daughter     Review of Systems: Positive: N/A Negative:   A further 10 point review of systems was negative except what is listed in the HPI.                  Physical Exam: @VITALS2 @ General: No acute distress.  Awake. Head:  Normocephalic.  Atraumatic. ENT:  EOMI.  Mucous membranes moist Neck:  Supple.  No lymphadenopathy. CV:  S1 present. S2 present. Regular rate. Pulmonary: Equal effort bilaterally.  Clear to auscultation bilaterally. Abdomen: Soft. none tender to palpation.ostomy in place Skin:  Normal turgor.  No visible rash. Extremity: No gross deformity of bilateral upper extremities.  No gross deformity of                             lower extremities. Neurologic: Alert. Appropriate mood.    Studies:  Recent Labs     11/03/14  1307  HGB  8.6*    No results for input(s): NA, K, CL, CO2, BUN, CREATININE, CALCIUM, GFRNONAA, GFRAA in the last 72 hours.  Invalid input(s): MAGNESIUM   No results for input(s): INR, APTT in the last 72 hours.  Invalid input(s): PT   Invalid input(s): ABG    Assessment:  1. Bilateral hydronephrosis with stents in bilaterally, as well as bilateral nephrostomy tubes    2. renal insufficiency, acute on chronic, worsening. He has bilateral nephrostomy tubes.    3. Rectovesical fistula, with fecal diversion and urethral catheter in place   Plan: Cystoscopy, double-J stent exchange

## 2014-11-03 NOTE — Anesthesia Procedure Notes (Signed)
Procedure Name: LMA Insertion Date/Time: 11/03/2014 1:41 PM Performed by: Bethena Roys T Pre-anesthesia Checklist: Patient identified, Emergency Drugs available, Suction available and Patient being monitored Patient Re-evaluated:Patient Re-evaluated prior to inductionOxygen Delivery Method: Circle System Utilized Preoxygenation: Pre-oxygenation with 100% oxygen Intubation Type: IV induction Ventilation: Mask ventilation without difficulty LMA: LMA inserted LMA Size: 4.0 Number of attempts: 1 Airway Equipment and Method: Bite block Placement Confirmation: positive ETCO2 Tube secured with: Tape Dental Injury: Teeth and Oropharynx as per pre-operative assessment

## 2014-11-03 NOTE — Discharge Instructions (Signed)
1. You may see some blood in the urine and may have some burning with urination for 48-72 hours. You also may notice that you have to urinate more frequently or urgently after your procedure which is normal.  2. You should call should you develop an inability urinate, fever > 101, persistent nausea and vomiting that prevents you from eating or drinking to stay hydrated.  3. If you have a stent, you will likely urinate more frequently and urgently until the stent is removed and you may experience some discomfort/pain in the lower abdomen and flank especially when urinating. You may take pain medication prescribed to you if needed for pain. You may also intermittently have blood in the urine until the stent is removed. 4. If you have a catheter, you will be taught how to take care of the catheter by the nursing staff prior to discharge from the hospital.  You may periodically feel a strong urge to void with the catheter in place.  This is a bladder spasm and most often can occur when having a bowel movement or moving around. It is typically self-limited and usually will stop after a few minutes.  You may use some Vaseline or Neosporin around the tip of the catheter to reduce friction at the tip of the penis. You may also see some blood in the urine.  A very small amount of blood can make the urine look quite red.  As long as the catheter is draining well, there usually is not a problem.  However, if the catheter is not draining well and is bloody, you should call the office [phone]) to notify us.   [facility] [phone] Post Ureteroscopy With or Without Stent Instructions  Definitions:  Ureter: The duct that transports urine from the kidney to the bladder. Stent:   A plastic hollow tube that is placed into the ureter, from the kidney to the                 bladder to prevent the ureter from swelling shut.  GENERAL INSTRUCTIONS:  Despite the fact that no skin incisions were  used, the area around the ureter and bladder is raw and irritated. The stent is a foreign body which will further irritate the bladder wall. This irritation is manifested by increased frequency of urination, both day and night, and by an increase in the urge to urinate. In some, the urge to urinate is present almost always. Sometimes the urge is strong enough that you may not be able to stop yourself from urinating. The only real cure is to remove the stent and then give time for the bladder wall to heal which can't be done until the danger of the ureter swelling shut has passed, which varies.  You may see some blood in your urine while the stent is in place and a few days afterwards. Do not be alarmed, even if the urine was clear for a while. Get off your feet and drink lots of fluids until clearing occurs. If you start to pass clots or don't improve, call us.  DIET: You may return to your normal diet immediately. Because of the raw surface of your bladder, alcohol, spicy foods, acid type foods and drinks with caffeine may cause irritation or frequency and should be used in moderation. To keep your urine flowing freely and to avoid constipation, drink plenty of fluids during the day ( 8-10 glasses ). Tip: Avoid cranberry juice because it is very acidic.  ACTIVITY: Your physical activity doesn't need to be restricted. However, if you are very active, you may see some blood in your urine. We suggest that you reduce your activity under these circumstances until the bleeding has stopped.  BOWELS: It is important to keep your bowels regular during the postoperative period. Straining with bowel movements can cause bleeding. A bowel movement every other day is reasonable. Use a mild laxative if needed, such as Milk of Magnesia 2-3 tablespoons, or 2 Dulcolax tablets. Call if you continue to have problems. If you have been taking narcotics for pain, before, during or after your surgery, you may be constipated.  Take a laxative if necessary.   MEDICATION: You should resume your pre-surgery medications unless told not to. In addition you will often be given an antibiotic to prevent infection. These should be taken as prescribed until the bottles are finished unless you are having an unusual reaction to one of the drugs.  PROBLEMS YOU SHOULD REPORT TO [place]:  Fevers over 100.5 Fahrenheit.  Heavy bleeding, or clots ( See above notes about blood in urine ).  Inability to urinate.  Drug reactions ( hives, rash, nausea, vomiting, diarrhea ).  Severe burning or pain with urination that is not improving.  FOLLOW-UP: You will need a follow-up appointment to monitor your progress. Call for this appointment at the number listed above. Usually the first appointment will be about three to fourteen days after your surgery.       Post Anesthesia Home Care Instructions  Activity: Get plenty of rest for the remainder of the day. A responsible adult should stay with you for 24 hours following the procedure.  For the next 24 hours, DO NOT: -Drive a car -Paediatric nurse -Drink alcoholic beverages -Take any medication unless instructed by your physician -Make any legal decisions or sign important papers.  Meals: Start with liquid foods such as gelatin or soup. Progress to regular foods as tolerated. Avoid greasy, spicy, heavy foods. If nausea and/or vomiting occur, drink only clear liquids until the nausea and/or vomiting subsides. Call your physician if vomiting continues.  Special Instructions/Symptoms: Your throat may feel dry or sore from the anesthesia or the breathing tube placed in your throat during surgery. If this causes discomfort, gargle with warm salt water. The discomfort should disappear within 24 hours.  If you had a scopolamine patch placed behind your ear for the management of post- operative nausea and/or vomiting:  1. The medication in the patch is effective for 72 hours, after  which it should be removed.  Wrap patch in a tissue and discard in the trash. Wash hands thoroughly with soap and water. 2. You may remove the patch earlier than 72 hours if you experience unpleasant side effects which may include dry mouth, dizziness or visual disturbances. 3. Avoid touching the patch. Wash your hands with soap and water after contact with the patch.

## 2014-11-03 NOTE — Transfer of Care (Signed)
Immediate Anesthesia Transfer of Care Note  Patient: Eric Munoz  Procedure(s) Performed: Procedure(s): CYSTOSCOPY WITH STENT REPLACEMENT (Bilateral)  Patient Location: PACU  Anesthesia Type:General  Level of Consciousness: awake and oriented  Airway & Oxygen Therapy: Patient Spontanous Breathing and Patient connected to nasal cannula oxygen  Post-op Assessment: Report given to RN  Post vital signs: Reviewed and stable  Last Vitals:  Filed Vitals:   11/03/14 1205  BP: 120/65  Pulse: 67  Temp: 36.4 C  Resp: 16    Complications: No apparent anesthesia complications

## 2014-11-03 NOTE — Op Note (Signed)
Preoperative diagnosis: Bilateral ureteral strictures with hydronephrosis, treated with both ureteral catheters and nephrostomy tubes  Postoperative diagnosis: Same  Procedure: Cystoscopy, bilateral double-J stent exchange-on right, 7 French by 24 cm contour without string. On the left, 7 [place] by 26 cm contour stent without string  Surgeon: Dahlstedt  Anesthesia: Gen. 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 He presents at this time for stent change-she has had these long-term.  Procedure: The patient was properly identified in the holding area. He received preoperative IV antibiotic. He was taken to the operating room where general anesthesia with LMA was administered. He was placed in the dorsolithotomy position. Genitalia and perineum were prepped and draped. Proper timeout was performed.  A 23 French panendoscope was advanced and was bladder. Urethra was normal except for a small fistula in the posterior prostatic urethra consistent with prior urethrorectal fistula. This was easily navigated passed per the bladder was entered. Bilateral double-J stents were present at his orifice ease. No other bladder lesions were noted. I grasped the right double-J stent and brought out through the urethral meatus. I then navigated a 0.038 inch sensor-tip guidewire through this. The sensor-tip wire was easily passed through the stent and up into the right renal collecting system. I then removed the stent over top of the guidewire. The guidewire was backloaded into the cystoscope, and cystoscopically the 7 French by 24 cm double-J stent was placed. Fluoroscopic guidance was used for positioning. Following adequate positioning the guidewire was removed and good  proximal distal curls were seen.  The same exact procedure was carried out with a left ureteral stent. The stent was barely visible at the ureteral orifice and was somewhat difficult to grasp it because of this, a longer stent was used, as dictated above.  Following this, with adequate positioning seen in both stents, I placed a 16 French coud-tip catheter. The lumen was filled with 10 mL of water and hooked to dependent drainage.  The patient tolerated procedure well. He was awakened and taken to the PACU in stable condition.

## 2014-11-03 NOTE — Anesthesia Preprocedure Evaluation (Addendum)
Anesthesia Evaluation  Patient identified by MRN, date of birth, ID band Patient awake    Reviewed: Allergy & Precautions, H&P , NPO status , Patient's Chart, lab work & pertinent test results  History of Anesthesia Complications History of anesthetic complications: intubated over a bougie in 2012.   Airway Mallampati: IV  TM Distance: <3 FB Neck ROM: Full  Mouth opening: Limited Mouth Opening  Dental no notable dental hx. (+) Teeth Intact, Dental Advisory Given   Pulmonary neg pulmonary ROS, former smoker,    Pulmonary exam normal breath sounds clear to auscultation       Cardiovascular Exercise Tolerance: Good negative cardio ROS Normal cardiovascular exam Rhythm:Regular Rate:Normal     Neuro/Psych negative neurological ROS  negative psych ROS   GI/Hepatic Neg liver ROS, Colon cancer 2007, recurrent [id number]   History of colon cancer, stage IV, Stage 4, [id number] (Chem last Feb12/ Radiation-last Jan12   Endo/Other  negative endocrine ROS  Renal/GU Renal Insufficiency and CRFRenal disease  negative genitourinary   Musculoskeletal negative musculoskeletal ROS (+)   Abdominal   Peds  Hematology negative hematology ROS (+) anemia ,   Anesthesia Other Findings Marked overbite with diminished oral radius.  Reproductive/Obstetrics negative OB ROS                           Anesthesia Physical  Anesthesia Plan  ASA: III  Anesthesia Plan: General   Post-op Pain Management:    Induction: Intravenous  Airway Management Planned: LMA  Additional Equipment:   Intra-op Plan:   Post-operative Plan:   Informed Consent: I have reviewed the patients History and Physical, chart, labs and discussed the procedure including the risks, benefits and alternatives for the proposed anesthesia with the patient or authorized representative who has indicated his/her understanding and acceptance.    Dental Advisory Given  Plan Discussed with: CRNA and Surgeon  Anesthesia Plan Comments:         Anesthesia Quick Evaluation

## 2014-11-06 ENCOUNTER — Encounter: Payer: Self-pay | Admitting: Hematology

## 2014-11-06 ENCOUNTER — Telehealth: Payer: Self-pay | Admitting: Hematology

## 2014-11-06 ENCOUNTER — Ambulatory Visit (HOSPITAL_BASED_OUTPATIENT_CLINIC_OR_DEPARTMENT_OTHER): Payer: Medicare Other | Admitting: Hematology

## 2014-11-06 ENCOUNTER — Other Ambulatory Visit (HOSPITAL_BASED_OUTPATIENT_CLINIC_OR_DEPARTMENT_OTHER): Payer: Medicare Other

## 2014-11-06 VITALS — BP 127/60 | HR 67 | Temp 98.0°F | Resp 18 | Ht 69.0 in | Wt 151.2 lb

## 2014-11-06 DIAGNOSIS — C19 Malignant neoplasm of rectosigmoid junction: Secondary | ICD-10-CM | POA: Diagnosis not present

## 2014-11-06 DIAGNOSIS — Z85048 Personal history of other malignant neoplasm of rectum, rectosigmoid junction, and anus: Secondary | ICD-10-CM

## 2014-11-06 DIAGNOSIS — N184 Chronic kidney disease, stage 4 (severe): Secondary | ICD-10-CM | POA: Diagnosis not present

## 2014-11-06 DIAGNOSIS — D631 Anemia in chronic kidney disease: Secondary | ICD-10-CM

## 2014-11-06 LAB — CBC & DIFF AND RETIC
BASO%: 0.2 % (ref 0.0–2.0)
Basophils Absolute: 0 10*3/uL (ref 0.0–0.1)
EOS%: 2.7 % (ref 0.0–7.0)
Eosinophils Absolute: 0.2 10*3/uL (ref 0.0–0.5)
HCT: 29.6 % — ABNORMAL LOW (ref 38.4–49.9)
HGB: 10.1 g/dL — ABNORMAL LOW (ref 13.0–17.1)
IMMATURE RETIC FRACT: 6 % (ref 3.00–10.60)
LYMPH%: 6.8 % — AB (ref 14.0–49.0)
MCH: 32.3 pg (ref 27.2–33.4)
MCHC: 34.1 g/dL (ref 32.0–36.0)
MCV: 94.6 fL (ref 79.3–98.0)
MONO#: 0.5 10*3/uL (ref 0.1–0.9)
MONO%: 8.4 % (ref 0.0–14.0)
NEUT#: 5.2 10*3/uL (ref 1.5–6.5)
NEUT%: 81.9 % — AB (ref 39.0–75.0)
PLATELETS: 241 10*3/uL (ref 140–400)
RBC: 3.13 10*6/uL — AB (ref 4.20–5.82)
RDW: 14.6 % (ref 11.0–14.6)
Retic %: 0.92 % (ref 0.80–1.80)
Retic Ct Abs: 28.8 10*3/uL — ABNORMAL LOW (ref 34.80–93.90)
WBC: 6.3 10*3/uL (ref 4.0–10.3)
lymph#: 0.4 10*3/uL — ABNORMAL LOW (ref 0.9–3.3)

## 2014-11-06 LAB — COMPREHENSIVE METABOLIC PANEL (CC13)
ALK PHOS: 50 U/L (ref 40–150)
ALT: <9 U/L (ref 0–55)
ANION GAP: 9 meq/L (ref 3–11)
AST: 14 U/L (ref 5–34)
Albumin: 3.4 g/dL — ABNORMAL LOW (ref 3.5–5.0)
BUN: 61.4 mg/dL — ABNORMAL HIGH (ref 7.0–26.0)
CALCIUM: 9.3 mg/dL (ref 8.4–10.4)
CO2: 14 meq/L — AB (ref 22–29)
Chloride: 118 mEq/L — ABNORMAL HIGH (ref 98–109)
Creatinine: 4.4 mg/dL (ref 0.7–1.3)
EGFR: 12 mL/min/{1.73_m2} — AB (ref >90)
Glucose: 81 mg/dl (ref 70–140)
Potassium: 4.5 mEq/L (ref 3.5–5.1)
Sodium: 141 mEq/L (ref 136–145)
Total Protein: 6.6 g/dL (ref 6.4–8.3)

## 2014-11-06 NOTE — Progress Notes (Signed)
[facility] ONCOLOGY OFFICE PROGRESS NOTE DATE OF VISIT: 10/31/2013  Eric Solo, MD [place] [address]  DIAGNOSIS: Rectosigmoid cancer, stage IV   CURRENT THERAPY:  Observation.  INTERVAL HISTORY:  Eric Munoz 74 y.o. 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 He was last seen by me 6 months ago. He is doing very well,  Denies any significant pain, nausea, change of his bowel habits, or any bleeding signs. He remains to be active, traveled  A few times in the past few months. He is actually thinking to move to [place] to be close to his son and his family  Next year.  He underwent  Bilateral ureteral stent exchange last week.  He had a mild hematuria for 2 days, no other complaints.   MEDICAL HISTORY: Past Medical History  Diagnosis Date  . Urinoma, s/p bladder repair   . History of acute renal failure nov 2012  . Colovesical fistula   . S/P ileostomy ([place])     12-20-2010  . Foley catheter in place   . Nephrostomy status ([place]) changed -10/26/2014    CURRENTLY HAS BILATERAL TUBES w/ changes every 6 weeks  . History of colon cancer, stage IV     2007 w/ recurrence july 2011--  RECTOSIGMOID [id number] s/p left coloectomy/ lar--  chemo complete feb 2012;  rxt complete jan 2012----   . Iron deficiency anemia   . Ureteral stricture     bilateral--  bilateral ureteral stents w/  changes every 6 months approx.  . Chronic kidney disease (CKD), stage III (moderate) SECONDARY UNCLEAR ETIOLOGY, POSSIBLE RELATED RADIATION THERAPY      NEPHROLOGIST,  DR Marval Regal  . Acute-on-chronic kidney injury ([place])     SECONDARY TO OBSTRUCTIVE UROPATHY--  BILATERAL URETERAL STENTS, BILATERAL NEPHROSTOMY TUBES AND FOLEY CATHETER  . Rectosigmoid cancer ([place])     stage iv--  no  recurrent mestastatic disease--  oncologist--  dr Shanon Brow chism/ dr Bernadene Bell ([facility])  . Difficult intubation  ASA IV W/ MAY 2013 AT [facility] MAIN OR--  INTUBATED OVER BOUGIE  BY DR FORTUNE    PT DID OK W/ LMA AT [facility] '13,'14,'15 -DR ROSE &  dr fortune at [facility] 02-18-2013    INTERIM HISTORY: has History of adenomatous polyp of rectum s/p low LAR; History of colon cancer, stage IV, Stage 4, [id number] (Chem last Feb12/ Radiation-last Jan12); Ureteral stricture, left; Loop diverting ileostomy, [id number]; Colourethral fistula ; Renal insufficiency; Unspecified deficiency anemia; Tachycardia; UTI (lower urinary tract infection); Acute renal failure in the setting of indwelling ureteral stents and bilateral hydronephrosis; Hyperkalemia; Metabolic acidosis; Normocytic anemia; Rectosigmoid cancer, stage IV; Pyonephrosis; and Hypokalemia on his problem list.    ALLERGIES:  has No Known Allergies.  MEDICATIONS: has a current medication list which includes the following prescription(s): multivitamin with minerals and sulfamethoxazole-trimethoprim.  SURGICAL HISTORY:  Past Surgical History  Procedure Laterality Date  . Portacath placement  09-17-2009  . Colostomy takedown  12/20/2010    Procedure: LAPAROSCOPIC COLOSTOMY TAKEDOWN;  Surgeon: Adin Hector, MD;  Location: [facility];  Service: General;  Laterality: N/A;  Laparoscopy Colostomy Takedown With Ileostomy  . Port-a-cath removal  12/20/2010    Procedure: REMOVAL PORT-A-CATH;  Surgeon: Adin Hector, MD;  Location: [facility];  Service: General;  Laterality: Right;  Right  venous catheter no longer needed.  . Cystoscopy w/ ureteral stent placement  01/13/2011    Procedure: CYSTOSCOPY WITH RETROGRADE PYELOGRAM/URETERAL STENT PLACEMENT;  Surgeon: Franchot Gallo;  Location: [facility];  Service: Urology;  Laterality: Left;  intra-operative cystogram  . Tonsillectomy  yrs ago  . Fracture surgery  1950'S    RIGHT ARM SURGERY FOR FX  . Examination under  anesthesia  06/19/2011    Procedure: EXAM UNDER ANESTHESIA;  Surgeon: Adin Hector, MD;  Location: [facility];  Service: General;;  . Cystoscopy w/ ureteral stent placement  06/19/2011    Procedure: CYSTOSCOPY WITH RETROGRADE PYELOGRAM/URETERAL STENT PLACEMENT;  Surgeon: Franchot Gallo, MD;  Location: [facility];  Service: Urology;  Laterality: Bilateral;  Cystoscopy/Bilateral Double J Stent insertion , flexible cystoscopy   . Cystoscopy w/ ureteral stent placement  10/24/2011    Procedure: CYSTOSCOPY WITH STENT REPLACEMENT;  Surgeon: Franchot Gallo, MD;  Location: [facility];  Service: Urology;  Laterality: Bilateral;  45 mins requested for this case   . Cystoscopy w/ retrogrades  10/24/2011    Procedure: CYSTOSCOPY WITH RETROGRADE PYELOGRAM;  Surgeon: Franchot Gallo, MD;  Location: [facility];  Service: Urology;  Laterality: Left;  . Transanal resection of rectal polyp and sigmoid colectomy  03/15/2004  . Left hydrocele surgery  03/16/2009  . Cystoscopy w/ ureteral stent placement Bilateral 04/09/2012    Procedure: CYSTOSCOPY WITH bilateral  STENT REPLACEMENT;  Surgeon: Franchot Gallo, MD;  Location: [facility];  Service: Urology;  Laterality: Bilateral;  . Cysto/ left retrograde pyelogram/ left stent placement  03-01-2010  &  09-13-2010  . Multiple nephrostomy tube changes  every six weeks  . Colon surgery  08/07/09    Extensive lysis adhesions/ Re-do Low Anterior Rescection, Reconstruction left external iliac artery/ Left ureterolysis and ureter disposition  . Cystoscopy w/ ureteral stent placement Bilateral 12/13/2012    Procedure: CYSTOSCOPY WITH STENT REPLACEMENT;  Surgeon: Franchot Gallo, MD;  Location: [facility];  Service: Urology;  Laterality: Bilateral;  . Cystoscopy with ureteroscopy and stent placement Bilateral 02/18/2013    Procedure: CYSTOSCOPY WITH URETEROSCOPY AND BILATERAL STENT EXCHANGE;  Surgeon: Franchot Gallo, MD;  Location: [facility];  Service: Urology;  Laterality: Bilateral;  . Cystoscopy w/ ureteral stent placement Bilateral 01/13/2014    Procedure: CYSTOSCOPY WITH STENT REPLACEMENT;  Surgeon: Jorja Loa, MD;  Location: [facility];  Service: Urology;  Laterality: Bilateral;  . Cystoscopy w/ ureteral stent placement Bilateral 11/03/2014    Procedure: CYSTOSCOPY WITH STENT REPLACEMENT;  Surgeon: Franchot Gallo, MD;  Location: [facility];  Service: Urology;  Laterality: Bilateral;   PROBLEM LIST: 1. Adenocarcinoma of the rectosigmoid stage IVA, T4b N2a M1a cancer involving with involvement of periaortic lymph node. Diagnosis was established in July of 2011. The patient underwent rather extensive surgery on both July 5th and August 08, 2009. There were multiple tumor nodules. Six out of 26 lymph nodes were involved along with adjacent small bowel. There was involvement of a periaortic lymph node. There was a close radial margin. There was tumor thrombus in a vein. There was extracapsular extension, lymphovascular and perineural invasion, invasion of tumor into the left external iliac artery and left ureter. KRAS mutation was not detected, and thus the tumor is KRAS wild type. Surgery involved a colostomy and placement of a left ureteral stent. There was a question of whether this represented a de novo cancer or possibly  recurrence from a previous sigmoid well-differentiated adenocarcinoma that was resected by Dr. Georganna Munoz on 03/15/2004. This tumor arose apparently in a polyp with 0/3 lymph nodes and was felt to be T1 stage I. The margin, however, was 0.5 mm. After the patient's surgery in July 2011, he received 7 cycles of FOLFOX from 09/26/2009 through 12/24/2009. The patient received pelvic radiation with continuous infusion 5-fluorouracil from 01/07/2010 through 02/15/2009. The patient then received 3 cycles of 5-FU leucovorin and 5-FU by  continuous infusion from 03/04/2010 through 04/01/2010. 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 PET scan carried out on 06/16/2011 showed no evidence of recurrent or metastatic disease.  2. Ileostomy, 12/20/2010.  3. Right leg weakness secondary to femoral nerve injuries following  the patient's surgery in July 2011. Currently resolved.  4. The patient underwent extensive surgery on December 20, 2010. This consisted of lysis of adhesions, laparoscopic-assisted splenic flexure mobilization, laparoscopic colostomy takedown, primary repair of bladder injury, diverting loop ileostomy in the right upper quadrant, and removal of the patient's Port-A-Cath.  5. Bilateral ureteral stents, most recently exchanged this month on 03/01/2013 by Dr. Diona Fanti.  6. 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  7. Progressive weight loss.  8. Anemia noted in January 2013 with a prior history of iron  deficiency anemia requiring intravenous Feraheme 510 mg  on 09/26/2009 and on 10/10/2009. Renal insufficiency is probably contributing to this patient's anemia at the present time.  9. Development of renal insufficiency noted in May 2013 with acute decompensation noted on 11/04/2011.  10. Bilateral percutaneous nephrostomies carried out on January 10, 2012 for  bilateral hydronephrosis. These tubes were removed in April 2014 but  needed to be reinserted on 06/15/2012 because of renal decompensation.   REVIEW OF SYSTEMS:   Constitutional: Denies fevers, chills or abnormal weight loss Eyes: Denies blurriness of vision Ears, nose, mouth, throat, and face: Denies mucositis or sore throat Respiratory: Denies cough, dyspnea or wheezes Cardiovascular: Denies palpitation, chest discomfort or lower extremity swelling Gastrointestinal:  Denies nausea, heartburn or change in bowel habits Skin: Denies abnormal skin rashes Lymphatics: Denies new lymphadenopathy or easy  bruising Neurological:Denies numbness, tingling or new weaknesses Behavioral/Psych: Mood is stable, no new changes  All other systems were reviewed with the patient and are negative.  PHYSICAL EXAMINATION: ECOG PERFORMANCE STATUS: 0-Asymptomatic  Blood pressure 127/60, pulse 67, temperature 98 F (36.7 C), temperature source Oral, resp. rate 18, height 5' 9"  (1.753 m), weight 151 lb 3.2 oz (68.584 kg), SpO2 100 %.  GENERAL:alert, no distress and comfortable thin middle-aged male SKIN: skin color, texture, turgor are normal, no rashes or significant lesions EYES: normal, Conjunctiva are pink and non-injected, sclera clear OROPHARYNX:no exudate, no erythema and lips, buccal mucosa, and tongue normal  NECK: supple, thyroid normal size, non-tender, without nodularity LYMPH:  no palpable lymphadenopathy in the cervical, axillary or supraclavicular LUNGS: clear to auscultation and percussion with normal breathing effort HEART: regular rate & rhythm and no murmurs and one plus pretibial edema (left greater than right) ABDOMEN:abdomen soft, non-tender and normal bowel sounds; ileostomy bag in right mid abdomen; he does also have bilateral nephrostomy tubes in place. Also has a Foley catheter with urine that is clear. Musculoskeletal:no cyanosis of digits and no clubbing  NEURO: alert & oriented x 3 with fluent speech, no focal motor/sensory deficits   LABORATORY DATA: CBC Latest Ref Rng 11/06/2014 11/03/2014 05/08/2014  WBC 4.0 - 10.3 10e3/uL 6.3 - 4.6  Hemoglobin 13.0 - 17.1 g/dL 10.1(L) 8.6(L) 10.8(L)  Hematocrit 38.4 - 49.9 % 29.6(L) - 33.3(L)  Platelets 140 - 400 10e3/uL 241 - 256    CMP Latest Ref Rng 11/06/2014 05/08/2014 01/13/2014  Glucose 70 - 140 mg/dl 81 87 86  BUN 7.0 - 26.0 mg/dL 61.4(H) 45.6(H) 56(H)  Creatinine 0.7 - 1.3 mg/dL 4.4(HH) 3.3(HH) 3.60(H)  Sodium 136 - 145 mEq/L 141 145 145  Potassium 3.5 - 5.1 mEq/L 4.5 4.3 4.5  Chloride 96 - 112 mEq/L - - 117(H)  CO2 22 - 29 mEq/L  14(L) 18(L) -  Calcium 8.4 - 10.4 mg/dL 9.3 9.2 -  Total Protein 6.4 - 8.3 g/dL 6.6 6.6 -  Total Bilirubin 0.20 - 1.20 mg/dL <0.30 0.21 -  Alkaline Phos 40 - 150 U/L 50 57 -  AST 5 - 34 U/L 14 14 -  ALT 0 - 55 U/L <9 14 -   CEA  Status: Finalresult Visible to patient:  MyChart Nextappt: Today at 09:30 AM in [facility] 2) Dx:  Rectosigmoid cancer, stage IV           Ref Range 26moago (05/08/14) 1235yrgo (10/31/13) 1y44yro (06/29/13) 35yr44yr (03/02/13)    CEA 0.0 - 5.0 ng/mL 2.2 1.4 2.0 1.7         RADIOGRAPHIC STUDIES: PET 05/23/2014 IMPRESSION: 1. No evidence of local recurrence or metastatic disease. 2. Bilateral nephro ureteral catheters remain in place.   ASSESSMENT: JameJOSHAWA [email or web address]. male with a history of stage 4 colon cancer in remission.   1. Stage IV rectosigmoid adenocarcinoma with periaortic lymph nodes metastasis, in remission -He is clinically doing very well without any symptoms. His physical exam today was normal. -His last PET scan was  Done in April 2016, which showed no evidence of disease recurrence. -His CEA continues to be normal,  Today's result is still pending -  He is 4 years out of his initial diagnosis, we discussed that the risk of cancer recurrence his last now.  We'll continue clinical surveillance with lab and exam.   I do not plan to repeat routine scan,  Unless there is clinical or lab suspicion for recurrence.  2. Anemia, secondary to CKD  -His anemia is slightly worse today, hemoglobin 10.1 today,  Possible relates to his recent ureteral stent  Exchange. -We'll continue monitoring.  I'll check his iron level on next visit. - I told him to take oral iron once a daily.  - we also discussed the role of ESA,  If his anemia gets worse to the level of below 10, I would consider ESA.  3. CKD, stage IV -He will continue to follow-up with his urologist and have nephrostomy tube exchange periodically.  Follow-up  return to clinic In 6 months with lab.  I spent 20 minutes counseling the patient face to face. The total time spent in the appointment was 25 minutes.  FengTruitt Merle/04/2014

## 2014-11-06 NOTE — Telephone Encounter (Signed)
Gave patient avs report and appointments for April 2017.  °

## 2014-11-06 NOTE — Anesthesia Postprocedure Evaluation (Signed)
  Anesthesia Post-op Note  Patient: Eric Munoz  Procedure(s) Performed: Procedure(s) (LRB): CYSTOSCOPY WITH STENT REPLACEMENT (Bilateral)  Patient Location: PACU  Anesthesia Type: General  Level of Consciousness: awake and alert   Airway and Oxygen Therapy: Patient Spontanous Breathing  Post-op Pain: mild  Post-op Assessment: Post-op Vital signs reviewed, Patient's Cardiovascular Status Stable, Respiratory Function Stable, Patent Airway and No signs of Nausea or vomiting  Last Vitals:  Filed Vitals:   11/03/14 1522  BP: 123/73  Pulse: 59  Temp: 36.5 C  Resp: 18    Post-op Vital Signs: stable   Complications: No apparent anesthesia complications

## 2014-11-07 LAB — CEA: CEA: 1.4 ng/mL (ref 0.0–5.0)

## 2014-11-16 ENCOUNTER — Encounter (HOSPITAL_BASED_OUTPATIENT_CLINIC_OR_DEPARTMENT_OTHER): Payer: Self-pay | Admitting: Urology

## 2014-12-07 ENCOUNTER — Ambulatory Visit (HOSPITAL_COMMUNITY)
Admission: RE | Admit: 2014-12-07 | Discharge: 2014-12-07 | Disposition: A | Discharge status: Home / Self Care | Payer: Medicare Other | Source: Ambulatory Visit | Attending: Urology | Admitting: Urology

## 2014-12-07 ENCOUNTER — Other Ambulatory Visit: Payer: Self-pay | Admitting: Urology

## 2014-12-07 DIAGNOSIS — Z436 Encounter for attention to other artificial openings of urinary tract: Secondary | ICD-10-CM | POA: Diagnosis not present

## 2014-12-07 DIAGNOSIS — N1330 Unspecified hydronephrosis: Secondary | ICD-10-CM

## 2014-12-07 DIAGNOSIS — N135 Crossing vessel and stricture of ureter without hydronephrosis: Secondary | ICD-10-CM | POA: Diagnosis not present

## 2014-12-07 DIAGNOSIS — N359 Urethral stricture, unspecified: Secondary | ICD-10-CM | POA: Insufficient documentation

## 2014-12-07 DIAGNOSIS — C189 Malignant neoplasm of colon, unspecified: Secondary | ICD-10-CM | POA: Insufficient documentation

## 2014-12-07 MED ORDER — IOHEXOL 300 MG/ML  SOLN
10.0000 mL | Freq: Once | INTRAMUSCULAR | Status: DC | PRN
Start: 2014-12-07 — End: 2014-12-08
  Administered 2014-12-07: 10 mL
  Filled 2014-12-07: qty 10

## 2014-12-07 NOTE — Procedures (Signed)
BIlat perc neph change No complication No blood loss. See complete dictation in [facility].

## 2014-12-12 DIAGNOSIS — N135 Crossing vessel and stricture of ureter without hydronephrosis: Secondary | ICD-10-CM | POA: Diagnosis not present

## 2015-02-01 ENCOUNTER — Other Ambulatory Visit (HOSPITAL_COMMUNITY): Payer: PRIVATE HEALTH INSURANCE

## 2015-03-06 ENCOUNTER — Ambulatory Visit (HOSPITAL_COMMUNITY)
Admission: RE | Admit: 2015-03-06 | Discharge: 2015-03-06 | Disposition: A | Discharge status: Home / Self Care | Payer: Medicare Other | Source: Ambulatory Visit | Attending: Urology | Admitting: Urology

## 2015-03-06 ENCOUNTER — Other Ambulatory Visit: Payer: Self-pay | Admitting: Urology

## 2015-03-06 DIAGNOSIS — N135 Crossing vessel and stricture of ureter without hydronephrosis: Secondary | ICD-10-CM | POA: Diagnosis not present

## 2015-03-06 DIAGNOSIS — C189 Malignant neoplasm of colon, unspecified: Secondary | ICD-10-CM | POA: Insufficient documentation

## 2015-03-06 DIAGNOSIS — N1330 Unspecified hydronephrosis: Secondary | ICD-10-CM

## 2015-03-06 DIAGNOSIS — Z436 Encounter for attention to other artificial openings of urinary tract: Secondary | ICD-10-CM | POA: Insufficient documentation

## 2015-03-06 DIAGNOSIS — N359 Urethral stricture, unspecified: Secondary | ICD-10-CM | POA: Insufficient documentation

## 2015-03-06 DIAGNOSIS — N179 Acute kidney failure, unspecified: Secondary | ICD-10-CM | POA: Diagnosis not present

## 2015-03-06 MED ORDER — IOHEXOL 300 MG/ML  SOLN
10.0000 mL | Freq: Once | INTRAMUSCULAR | Status: AC | PRN
  Administered 2015-03-06: 10 mL

## 2015-03-06 NOTE — Procedures (Signed)
Exchange bilat perc neph tubes No complication No blood loss. See complete dictation in [facility].

## 2015-03-16 DIAGNOSIS — N184 Chronic kidney disease, stage 4 (severe): Secondary | ICD-10-CM | POA: Diagnosis not present

## 2015-03-16 DIAGNOSIS — N2581 Secondary hyperparathyroidism of renal origin: Secondary | ICD-10-CM | POA: Diagnosis not present

## 2015-03-16 DIAGNOSIS — N189 Chronic kidney disease, unspecified: Secondary | ICD-10-CM | POA: Diagnosis not present

## 2015-03-21 DIAGNOSIS — N184 Chronic kidney disease, stage 4 (severe): Secondary | ICD-10-CM | POA: Diagnosis not present

## 2015-03-21 DIAGNOSIS — D631 Anemia in chronic kidney disease: Secondary | ICD-10-CM | POA: Diagnosis not present

## 2015-03-21 DIAGNOSIS — N2581 Secondary hyperparathyroidism of renal origin: Secondary | ICD-10-CM | POA: Diagnosis not present

## 2015-03-21 DIAGNOSIS — N139 Obstructive and reflux uropathy, unspecified: Secondary | ICD-10-CM | POA: Diagnosis not present

## 2015-04-17 ENCOUNTER — Other Ambulatory Visit: Payer: Self-pay | Admitting: Urology

## 2015-04-17 ENCOUNTER — Ambulatory Visit (HOSPITAL_COMMUNITY)
Admission: RE | Admit: 2015-04-17 | Discharge: 2015-04-17 | Disposition: A | Discharge status: Home / Self Care | Payer: Medicare Other | Source: Ambulatory Visit | Attending: Urology | Admitting: Urology

## 2015-04-17 DIAGNOSIS — Z436 Encounter for attention to other artificial openings of urinary tract: Secondary | ICD-10-CM | POA: Diagnosis not present

## 2015-04-17 DIAGNOSIS — N135 Crossing vessel and stricture of ureter without hydronephrosis: Secondary | ICD-10-CM | POA: Insufficient documentation

## 2015-04-17 DIAGNOSIS — N1330 Unspecified hydronephrosis: Secondary | ICD-10-CM

## 2015-04-17 DIAGNOSIS — Z85038 Personal history of other malignant neoplasm of large intestine: Secondary | ICD-10-CM | POA: Diagnosis not present

## 2015-04-17 DIAGNOSIS — N289 Disorder of kidney and ureter, unspecified: Secondary | ICD-10-CM | POA: Diagnosis not present

## 2015-04-17 MED ORDER — IOHEXOL 300 MG/ML  SOLN
10.0000 mL | Freq: Once | INTRAMUSCULAR | Status: AC | PRN
Start: 2015-04-17 — End: 2015-04-17
  Administered 2015-04-17: 10 mL

## 2015-05-07 ENCOUNTER — Ambulatory Visit (HOSPITAL_BASED_OUTPATIENT_CLINIC_OR_DEPARTMENT_OTHER): Payer: Medicare Other | Admitting: Hematology

## 2015-05-07 ENCOUNTER — Telehealth: Payer: Self-pay | Admitting: Hematology

## 2015-05-07 ENCOUNTER — Other Ambulatory Visit: Payer: Self-pay | Admitting: Hematology

## 2015-05-07 ENCOUNTER — Other Ambulatory Visit (HOSPITAL_BASED_OUTPATIENT_CLINIC_OR_DEPARTMENT_OTHER): Payer: Medicare Other

## 2015-05-07 ENCOUNTER — Encounter: Payer: Self-pay | Admitting: Hematology

## 2015-05-07 VITALS — BP 129/60 | HR 64 | Temp 97.5°F | Resp 17 | Ht 69.0 in | Wt 148.6 lb

## 2015-05-07 DIAGNOSIS — D509 Iron deficiency anemia, unspecified: Secondary | ICD-10-CM

## 2015-05-07 DIAGNOSIS — Z85048 Personal history of other malignant neoplasm of rectum, rectosigmoid junction, and anus: Secondary | ICD-10-CM | POA: Diagnosis not present

## 2015-05-07 DIAGNOSIS — N184 Chronic kidney disease, stage 4 (severe): Secondary | ICD-10-CM

## 2015-05-07 DIAGNOSIS — N189 Chronic kidney disease, unspecified: Principal | ICD-10-CM

## 2015-05-07 DIAGNOSIS — D631 Anemia in chronic kidney disease: Secondary | ICD-10-CM

## 2015-05-07 DIAGNOSIS — C19 Malignant neoplasm of rectosigmoid junction: Secondary | ICD-10-CM

## 2015-05-07 LAB — CBC & DIFF AND RETIC
BASO%: 0.4 % (ref 0.0–2.0)
Basophils Absolute: 0 10e3/uL (ref 0.0–0.1)
EOS%: 4.9 % (ref 0.0–7.0)
Eosinophils Absolute: 0.2 10e3/uL (ref 0.0–0.5)
HCT: 29 % — ABNORMAL LOW (ref 38.4–49.9)
HGB: 9.6 g/dL — ABNORMAL LOW (ref 13.0–17.1)
Immature Retic Fract: 5 % (ref 3.00–10.60)
LYMPH%: 13.1 % — ABNORMAL LOW (ref 14.0–49.0)
MCH: 31.8 pg (ref 27.2–33.4)
MCHC: 33.1 g/dL (ref 32.0–36.0)
MCV: 96 fL (ref 79.3–98.0)
MONO#: 0.2 10e3/uL (ref 0.1–0.9)
MONO%: 4.5 % (ref 0.0–14.0)
NEUT#: 3.6 10e3/uL (ref 1.5–6.5)
NEUT%: 77.1 % — ABNORMAL HIGH (ref 39.0–75.0)
Platelets: 230 10e3/uL (ref 140–400)
RBC: 3.02 10e6/uL — ABNORMAL LOW (ref 4.20–5.82)
RDW: 14.4 % (ref 11.0–14.6)
Retic %: 0.81 % (ref 0.80–1.80)
Retic Ct Abs: 24.46 10e3/uL — ABNORMAL LOW (ref 34.80–93.90)
WBC: 4.7 10e3/uL (ref 4.0–10.3)
lymph#: 0.6 10e3/uL — ABNORMAL LOW (ref 0.9–3.3)

## 2015-05-07 LAB — COMPREHENSIVE METABOLIC PANEL WITH GFR
ALT: 15 U/L (ref 0–55)
AST: 13 U/L (ref 5–34)
Albumin: 3.2 g/dL — ABNORMAL LOW (ref 3.5–5.0)
Alkaline Phosphatase: 51 U/L (ref 40–150)
Anion Gap: 6 meq/L (ref 3–11)
BUN: 53.6 mg/dL — ABNORMAL HIGH (ref 7.0–26.0)
CO2: 18 meq/L — ABNORMAL LOW (ref 22–29)
Calcium: 9.1 mg/dL (ref 8.4–10.4)
Chloride: 118 meq/L — ABNORMAL HIGH (ref 98–109)
Creatinine: 3.9 mg/dL (ref 0.7–1.3)
EGFR: 14 ml/min/1.73 m2 — ABNORMAL LOW
Glucose: 69 mg/dL — ABNORMAL LOW (ref 70–140)
Potassium: 4 meq/L (ref 3.5–5.1)
Sodium: 142 meq/L (ref 136–145)
Total Bilirubin: <0.3 mg/dL (ref 0.20–1.20)
Total Protein: 6.5 g/dL (ref 6.4–8.3)

## 2015-05-07 LAB — IRON AND TIBC
%SAT: 14 % — ABNORMAL LOW (ref 20–55)
Iron: 33 ug/dL — ABNORMAL LOW (ref 42–163)
TIBC: 239 ug/dL (ref 202–409)
UIBC: 205 ug/dL (ref 117–376)

## 2015-05-07 LAB — FERRITIN: FERRITIN: 163 ng/mL (ref 22–316)

## 2015-05-07 NOTE — Telephone Encounter (Signed)
Gave and printed appt sched and avs fo rpt for OCT °

## 2015-05-07 NOTE — Progress Notes (Signed)
[facility] PROGRESS NOTE DATE OF VISIT: 10/31/2013  Tawanna Solo, MD [place] [address]  DIAGNOSIS: Anemia in chronic kidney disease  Rectosigmoid cancer, stage IV   CURRENT THERAPY:  Observation.  INTERVAL HISTORY:  Eric Munoz 75 y.o. 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 He was last seen by me 6 months ago. He is doing very well, denies any pain, dyspnea, fatigue, or other symptoms. He is physically very active, has been busy with his moving to [place]. He is probably going to move when his house is being sold.   MEDICAL HISTORY: Past Medical History  Diagnosis Date  . Urinoma, s/p bladder repair   . History of acute renal failure nov 2012  . Colovesical fistula   . S/P ileostomy ([place])     12-20-2010  . Foley catheter in place   . Nephrostomy status ([place]) changed -10/26/2014    CURRENTLY HAS BILATERAL TUBES w/ changes every 6 weeks  . History of colon cancer, stage IV     2007 w/ recurrence july 2011--  RECTOSIGMOID [id number] s/p left coloectomy/ lar--  chemo complete feb 2012;  rxt complete jan 2012----   . Iron deficiency anemia   . Ureteral stricture     bilateral--  bilateral ureteral stents w/  changes every 6 months approx.  . Chronic kidney disease (CKD), stage III (moderate) SECONDARY UNCLEAR ETIOLOGY, POSSIBLE RELATED RADIATION THERAPY      NEPHROLOGIST,  DR Marval Regal  . Acute-on-chronic kidney injury ([place])     SECONDARY TO OBSTRUCTIVE UROPATHY--  BILATERAL URETERAL STENTS, BILATERAL NEPHROSTOMY TUBES AND FOLEY CATHETER  . Rectosigmoid cancer ([place])     stage iv--  no recurrent mestastatic disease--  oncologist--  dr Shanon Brow chism/ dr Bernadene Bell ([facility])  . Difficult intubation  ASA IV W/ MAY 2013 AT [facility] MAIN  OR--  INTUBATED OVER BOUGIE  BY DR FORTUNE    PT DID OK W/ LMA AT [facility] '13,'14,'15 -DR ROSE &  dr fortune at [facility] 02-18-2013    INTERIM HISTORY: has History of adenomatous polyp of rectum s/p low LAR; History of colon cancer, stage IV, Stage 4, [id number] (Chem last Feb12/ Radiation-last Jan12); Ureteral stricture, left; Loop diverting ileostomy, [id number]; Colourethral fistula ; Renal insufficiency; Unspecified deficiency anemia; Tachycardia; UTI (lower urinary tract infection); Acute renal failure in the setting of indwelling ureteral stents and bilateral hydronephrosis; Hyperkalemia; Metabolic acidosis; Normocytic anemia; Rectosigmoid cancer, stage IV; Pyonephrosis; Hypokalemia; and Anemia in chronic kidney disease on his problem list.    ALLERGIES:  has No Known Allergies.  MEDICATIONS: has a current medication list which includes the following prescription(s): multivitamin with minerals.  SURGICAL HISTORY:  Past Surgical History  Procedure Laterality Date  . Portacath placement  09-17-2009  . Colostomy takedown  12/20/2010    Procedure: LAPAROSCOPIC COLOSTOMY TAKEDOWN;  Surgeon: Adin Hector, MD;  Location: [facility];  Service: General;  Laterality: N/A;  Laparoscopy Colostomy Takedown With Ileostomy  . Port-a-cath removal  12/20/2010    Procedure: REMOVAL PORT-A-CATH;  Surgeon: Adin Hector, MD;  Location: [facility];  Service: General;  Laterality: Right;  Right  venous catheter no longer needed.  . Cystoscopy w/ ureteral stent placement  01/13/2011    Procedure: CYSTOSCOPY WITH RETROGRADE PYELOGRAM/URETERAL STENT PLACEMENT;  Surgeon: Franchot Gallo;  Location: [facility];  Service: Urology;  Laterality: Left;  intra-operative cystogram  . Tonsillectomy  yrs ago  . Fracture surgery  1950'S    RIGHT ARM SURGERY FOR FX  . Examination under anesthesia  06/19/2011    Procedure: EXAM UNDER ANESTHESIA;  Surgeon: Adin Hector, MD;  Location: [facility];  Service: General;;  . Cystoscopy w/  ureteral stent placement  06/19/2011    Procedure: CYSTOSCOPY WITH RETROGRADE PYELOGRAM/URETERAL STENT PLACEMENT;  Surgeon: Franchot Gallo, MD;  Location: [facility];  Service: Urology;  Laterality: Bilateral;  Cystoscopy/Bilateral Double J Stent insertion , flexible cystoscopy   . Cystoscopy w/ ureteral stent placement  10/24/2011    Procedure: CYSTOSCOPY WITH STENT REPLACEMENT;  Surgeon: Franchot Gallo, MD;  Location: [facility];  Service: Urology;  Laterality: Bilateral;  45 mins requested for this case   . Cystoscopy w/ retrogrades  10/24/2011    Procedure: CYSTOSCOPY WITH RETROGRADE PYELOGRAM;  Surgeon: Franchot Gallo, MD;  Location: [facility];  Service: Urology;  Laterality: Left;  . Transanal resection of rectal polyp and sigmoid colectomy  03/15/2004  . Left hydrocele surgery  03/16/2009  . Cystoscopy w/ ureteral stent placement Bilateral 04/09/2012    Procedure: CYSTOSCOPY WITH bilateral  STENT REPLACEMENT;  Surgeon: Franchot Gallo, MD;  Location: [facility];  Service: Urology;  Laterality: Bilateral;  . Cysto/ left retrograde pyelogram/ left stent placement  03-01-2010  &  09-13-2010  . Multiple nephrostomy tube changes  every six weeks  . Colon surgery  08/07/09    Extensive lysis adhesions/ Re-do Low Anterior Rescection, Reconstruction left external iliac artery/ Left ureterolysis and ureter disposition  . Cystoscopy w/ ureteral stent placement Bilateral 12/13/2012    Procedure: CYSTOSCOPY WITH STENT REPLACEMENT;  Surgeon: Franchot Gallo, MD;  Location: [facility];  Service: Urology;  Laterality: Bilateral;  . Cystoscopy with ureteroscopy and stent placement Bilateral 02/18/2013    Procedure: CYSTOSCOPY WITH URETEROSCOPY AND BILATERAL STENT EXCHANGE;  Surgeon: Franchot Gallo, MD;  Location: [facility];  Service: Urology;  Laterality: Bilateral;  . Cystoscopy w/ ureteral stent placement Bilateral  01/13/2014    Procedure: CYSTOSCOPY WITH STENT REPLACEMENT;  Surgeon: Jorja Loa, MD;  [facility];  Service: Urology;  Laterality: Bilateral;  . Cystoscopy w/ ureteral stent placement Bilateral 11/03/2014    Procedure: CYSTOSCOPY WITH STENT REPLACEMENT;  Surgeon: Franchot Gallo, MD;  Location: [facility];  Service: Urology;  Laterality: Bilateral;   PROBLEM LIST: 1. Adenocarcinoma of the rectosigmoid stage IVA, T4b N2a M1a cancer involving with involvement of periaortic lymph node. Diagnosis was established in July of 2011. The patient underwent rather extensive surgery on both July 5th and August 08, 2009. There were multiple tumor nodules. Six out of 26 lymph nodes were involved along with adjacent small bowel. There was involvement of a periaortic lymph node. There was a close radial margin. There was tumor thrombus in a vein. There was extracapsular extension, lymphovascular and perineural invasion, invasion of tumor into the left external iliac artery and left ureter. KRAS mutation was not detected, and thus the tumor is KRAS wild type. Surgery involved a colostomy and placement of a left ureteral stent. There was a question of whether this represented a de novo cancer or possibly recurrence from a previous sigmoid well-differentiated adenocarcinoma that was resected by Dr. Georganna Skeans on 03/15/2004. This tumor arose apparently in a polyp with 0/3 lymph nodes and was felt  to be T1 stage I. The margin, however, was 0.5 mm. After the patient's surgery in July 2011, he received 7 cycles of FOLFOX from 09/26/2009 through 12/24/2009. The patient received pelvic radiation with continuous infusion 5-fluorouracil from 01/07/2010 through 02/15/2009. The patient then received 3 cycles of 5-FU leucovorin and 5-FU by continuous infusion from 03/04/2010 through 04/01/2010. 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 PET scan carried out on  06/16/2011 showed no evidence of recurrent or metastatic disease.  2. Ileostomy, 12/20/2010.  3. Right leg weakness secondary to femoral nerve injuries following  the patient's surgery in July 2011. Currently resolved.  4. The patient underwent extensive surgery on December 20, 2010. This consisted of lysis of adhesions, laparoscopic-assisted splenic flexure mobilization, laparoscopic colostomy takedown, primary repair of bladder injury, diverting loop ileostomy in the right upper quadrant, and removal of the patient's Port-A-Cath.  5. Bilateral ureteral stents, most recently exchanged a few months ago by Dr. Diona Fanti.  6. 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  7. Progressive weight loss.  8. Anemia noted in January 2013 with a prior history of iron deficiency anemia requiring intravenous Feraheme 510 mg  on 09/26/2009 and on 10/10/2009. Renal insufficiency is probably contributing to this patient's anemia at the present time.  9. Development of renal insufficiency noted in May 2013 with acute decompensation noted on 11/04/2011.  10. Bilateral percutaneous nephrostomies carried out on January 10, 2012 for bilateral hydronephrosis. These tubes were removed in April 2014 but needed to be reinserted on 06/15/2012 because of renal decompensation.   REVIEW OF SYSTEMS:   Constitutional: Denies fevers, chills or abnormal weight loss Eyes: Denies blurriness of vision Ears, nose, mouth, throat, and face: Denies mucositis or sore throat Respiratory: Denies cough, dyspnea or wheezes Cardiovascular: Denies palpitation, chest discomfort or lower extremity swelling Gastrointestinal:  Denies nausea, heartburn or change in bowel habits Skin: Denies abnormal skin rashes Lymphatics: Denies new lymphadenopathy or easy bruising Neurological:Denies numbness, tingling or new weaknesses Behavioral/Psych: Mood is stable, no new changes  All other systems were reviewed with the patient and are  negative.  PHYSICAL EXAMINATION: ECOG PERFORMANCE STATUS: 0-Asymptomatic  Blood pressure 129/60, pulse 64, temperature 97.5 F (36.4 C), temperature source Oral, resp. rate 17, height _0  (1.753 m), weight 148 lb 9.6 oz (67.405 kg), SpO2 100 %.  GENERAL:alert, no distress and comfortable thin middle-aged male SKIN: skin color, texture, turgor are normal, no rashes or significant lesions EYES: normal, Conjunctiva are pink and non-injected, sclera clear OROPHARYNX:no exudate, no erythema and lips, buccal mucosa, and tongue normal  NECK: supple, thyroid normal size, non-tender, without nodularity LYMPH:  no palpable lymphadenopathy in the cervical, axillary or supraclavicular LUNGS: clear to auscultation and percussion with normal breathing effort HEART: regular rate & rhythm and no murmurs and one plus pretibial edema (left greater than right) ABDOMEN:abdomen soft, non-tender and normal bowel sounds; ileostomy bag in right mid abdomen; he does also have bilateral nephrostomy tubes in place. Also has a Foley catheter with urine that is clear. Musculoskeletal:no cyanosis of digits and no clubbing  NEURO: alert & oriented x 3 with fluent speech, no focal motor/sensory deficits   LABORATORY DATA: CBC Latest Ref Rng 05/07/2015 11/06/2014 11/03/2014  WBC 4.0 - 10.3 10e3/uL 4.7 6.3 -  Hemoglobin 13.0 - 17.1 g/dL 9.6(L) 10.1(L) 8.6(L)  Hematocrit 38.4 - 49.9 % 29.0(L) 29.6(L) -  Platelets 140 - 400 10e3/uL 230 241 -    CMP Latest Ref Rng 11/06/2014 05/08/2014 01/13/2014  Glucose 70 - 140  mg/dl 81 87 86  BUN 7.0 - 26.0 mg/dL 61.4(H) 45.6(H) 56(H)  Creatinine 0.7 - 1.3 mg/dL 4.4(HH) 3.3(HH) 3.60(H)  Sodium 136 - 145 mEq/L 141 145 145  Potassium 3.5 - 5.1 mEq/L 4.5 4.3 4.5  Chloride 96 - 112 mEq/L - - 117(H)  CO2 22 - 29 mEq/L 14(L) 18(L) -  Calcium 8.4 - 10.4 mg/dL 9.3 9.2 -  Total Protein 6.4 - 8.3 g/dL 6.6 6.6 -  Total Bilirubin 0.20 - 1.20 mg/dL <0.30 0.21 -  Alkaline Phos 40 - 150 U/L 50  57 -  AST 5 - 34 U/L 14 14 -  ALT 0 - 55 U/L <9 14 -   CEA  Status: Finalresult Visible to patient:  MyChart Nextappt: Today at 09:30 AM in [facility] 2) Dx:  Rectosigmoid cancer, stage IV           Ref Range 76moago (05/08/14) 187yrgo (10/31/13) 1y20yro (06/29/13) 52yr96yr (03/02/13)    CEA 0.0 - 5.0 ng/mL 2.2 1.4 2.0 1.7         RADIOGRAPHIC STUDIES: PET 05/23/2014 IMPRESSION: 1. No evidence of local recurrence or metastatic disease. 2. Bilateral nephro ureteral catheters remain in place.   ASSESSMENT: Eric SEDANOy68. male with a history of stage 4 colon cancer in remission.   1. Stage IV rectosigmoid adenocarcinoma with periaortic lymph nodes metastasis, in remission -He is clinically doing very well without any symptoms. His physical exam today was normal. -His last PET scan was  Done in April 2016, which showed no evidence of disease recurrence. -His CEA continues to be normal,  Today's result is still pending -  He is almost 5 years out of his initial diagnosis, we discussed that the risk of cancer recurrence is very low now.  We'll continue clinical surveillance with lab and exam.   I do not plan to repeat routine scan,  Unless there is clinical or lab suspicion for recurrence.  2. Anemia, secondary to CKD and history of IDA -His anemia has been gradually getting worse in the past year, hemoglobin 9.6 today,  Possible relates to his CKD. -We'll continue monitoring.  I'll check his iron level on next visit. -continueoral iron once a daily.  -I repeat his iron studies and ferritin today, results still pending - we also discussed the role of ESA,  If his anemia gets worse to the level of below 9.5, I would consider ESA, if he remains to be no evidence of recurrence from his colon cancer  3. CKD, stage IV -He will continue to follow-up with his urologist and have nephrostomy tube exchange periodically.  Follow-up return to clinic In 6  months with lab. I will call him for his CEA and iron study results, to see if he needs IV Feraheme. [place], he knows to call my office to transfer his medical records to his new hematologist in [place] spent 20 minutes counseling the patient face to face. The total time spent in the appointment was 25 minutes.  FengTruitt Merle3/2017

## 2015-05-08 ENCOUNTER — Other Ambulatory Visit: Payer: Self-pay | Admitting: *Deleted

## 2015-05-08 ENCOUNTER — Telehealth: Payer: Self-pay | Admitting: Hematology

## 2015-05-08 ENCOUNTER — Encounter: Payer: Self-pay | Admitting: *Deleted

## 2015-05-08 ENCOUNTER — Telehealth: Payer: Self-pay | Admitting: *Deleted

## 2015-05-08 LAB — CEA: CEA: 4.1 ng/mL (ref 0.0–4.7)

## 2015-05-08 LAB — CEA (PARALLEL TESTING): CEA: 1.7 ng/mL

## 2015-05-08 NOTE — Telephone Encounter (Signed)
Left message to inform pt of appt chnge from Oct to June per MD 4/3 pof

## 2015-05-08 NOTE — Telephone Encounter (Signed)
Called pt & informed that iron level slightly low & Dr Burr Medico would like him to start ferrous sulfate 1 tab daily & repeat labs in 2-3 months. Pt expressed understanding & already has lab appt.

## 2015-05-09 ENCOUNTER — Telehealth: Payer: Self-pay | Admitting: *Deleted

## 2015-05-09 NOTE — Telephone Encounter (Signed)
-----   Message from Truitt Merle, MD sent at 05/08/2015  9:29 AM EDT ----- Janifer Adie,  Please let pt know his tumor marker CEA was normal when you call him about his iron study result. Thanks.  Truitt Merle

## 2015-05-09 NOTE — Telephone Encounter (Signed)
Notified pt's wife of normal CEA & she will let pt know & was very pleased with result.

## 2015-05-29 ENCOUNTER — Other Ambulatory Visit (HOSPITAL_COMMUNITY): Payer: PRIVATE HEALTH INSURANCE

## 2015-06-13 ENCOUNTER — Telehealth: Payer: Self-pay | Admitting: Hematology

## 2015-06-13 NOTE — Telephone Encounter (Signed)
pt going out of town wanted 6/6 apt changed to earlier at end of may

## 2015-06-19 ENCOUNTER — Other Ambulatory Visit (HOSPITAL_COMMUNITY): Payer: PRIVATE HEALTH INSURANCE

## 2015-06-22 ENCOUNTER — Ambulatory Visit (HOSPITAL_COMMUNITY)
Admission: RE | Admit: 2015-06-22 | Discharge: 2015-06-22 | Disposition: A | Discharge status: Home / Self Care | Payer: Medicare Other | Source: Ambulatory Visit | Attending: Urology | Admitting: Urology

## 2015-06-22 DIAGNOSIS — N1330 Unspecified hydronephrosis: Secondary | ICD-10-CM | POA: Diagnosis present

## 2015-06-22 DIAGNOSIS — R338 Other retention of urine: Secondary | ICD-10-CM | POA: Diagnosis not present

## 2015-06-22 DIAGNOSIS — N135 Crossing vessel and stricture of ureter without hydronephrosis: Secondary | ICD-10-CM | POA: Insufficient documentation

## 2015-06-22 DIAGNOSIS — Z87448 Personal history of other diseases of urinary system: Secondary | ICD-10-CM | POA: Diagnosis not present

## 2015-06-22 MED ORDER — IOPAMIDOL (ISOVUE-300) INJECTION 61%
50.0000 mL | Freq: Once | INTRAVENOUS | Status: AC | PRN
  Administered 2015-06-22: 15 mL via INTRAVENOUS

## 2015-06-22 NOTE — Procedures (Signed)
Interventional Radiology Procedure Note  Procedure: exhcnage of bilateral nephrostomy tube.  Right is 79F pigtail, left is 71f dawson-mueller  Complications: None Recommendations:  - Ok to shower tomorrow - Do not submerge - Routine care   Signed,  Dulcy Fanny. Earleen Newport, DO

## 2015-06-28 ENCOUNTER — Other Ambulatory Visit (HOSPITAL_BASED_OUTPATIENT_CLINIC_OR_DEPARTMENT_OTHER): Payer: Medicare Other

## 2015-06-28 ENCOUNTER — Ambulatory Visit (HOSPITAL_BASED_OUTPATIENT_CLINIC_OR_DEPARTMENT_OTHER): Payer: Medicare Other | Admitting: Hematology

## 2015-06-28 ENCOUNTER — Encounter: Payer: Self-pay | Admitting: Hematology

## 2015-06-28 VITALS — BP 118/69 | HR 59 | Temp 97.7°F | Resp 18 | Ht 69.0 in | Wt 144.3 lb

## 2015-06-28 DIAGNOSIS — N184 Chronic kidney disease, stage 4 (severe): Secondary | ICD-10-CM | POA: Diagnosis not present

## 2015-06-28 DIAGNOSIS — D631 Anemia in chronic kidney disease: Secondary | ICD-10-CM | POA: Diagnosis not present

## 2015-06-28 DIAGNOSIS — Z85048 Personal history of other malignant neoplasm of rectum, rectosigmoid junction, and anus: Secondary | ICD-10-CM

## 2015-06-28 DIAGNOSIS — C19 Malignant neoplasm of rectosigmoid junction: Secondary | ICD-10-CM

## 2015-06-28 LAB — CBC & DIFF AND RETIC
BASO%: 0.2 % (ref 0.0–2.0)
BASOS ABS: 0 10*3/uL (ref 0.0–0.1)
EOS ABS: 0.2 10*3/uL (ref 0.0–0.5)
EOS%: 4.7 % (ref 0.0–7.0)
HEMATOCRIT: 29.1 % — AB (ref 38.4–49.9)
HGB: 9.5 g/dL — ABNORMAL LOW (ref 13.0–17.1)
Immature Retic Fract: 5.5 % (ref 3.00–10.60)
LYMPH#: 0.3 10*3/uL — AB (ref 0.9–3.3)
LYMPH%: 6.4 % — ABNORMAL LOW (ref 14.0–49.0)
MCH: 31 pg (ref 27.2–33.4)
MCHC: 32.6 g/dL (ref 32.0–36.0)
MCV: 95.1 fL (ref 79.3–98.0)
MONO#: 0.6 10*3/uL (ref 0.1–0.9)
MONO%: 11.8 % (ref 0.0–14.0)
NEUT#: 3.6 10*3/uL (ref 1.5–6.5)
NEUT%: 76.9 % — ABNORMAL HIGH (ref 39.0–75.0)
Platelets: 247 10*3/uL (ref 140–400)
RBC: 3.06 10*6/uL — ABNORMAL LOW (ref 4.20–5.82)
RDW: 14.1 % (ref 11.0–14.6)
RETIC %: 0.66 % — AB (ref 0.80–1.80)
RETIC CT ABS: 20.2 10*3/uL — AB (ref 34.80–93.90)
WBC: 4.7 10*3/uL (ref 4.0–10.3)

## 2015-06-28 LAB — COMPREHENSIVE METABOLIC PANEL
ALT: 14 U/L (ref 0–55)
AST: 14 U/L (ref 5–34)
Albumin: 3.3 g/dL — ABNORMAL LOW (ref 3.5–5.0)
Alkaline Phosphatase: 53 U/L (ref 40–150)
Anion Gap: 8 mEq/L (ref 3–11)
BUN: 65.6 mg/dL — AB (ref 7.0–26.0)
CALCIUM: 9 mg/dL (ref 8.4–10.4)
CHLORIDE: 118 meq/L — AB (ref 98–109)
CO2: 17 meq/L — AB (ref 22–29)
CREATININE: 4.4 mg/dL — AB (ref 0.7–1.3)
EGFR: 12 mL/min/{1.73_m2} — ABNORMAL LOW (ref >90)
GLUCOSE: 84 mg/dL (ref 70–140)
POTASSIUM: 4.3 meq/L (ref 3.5–5.1)
SODIUM: 143 meq/L (ref 136–145)
Total Bilirubin: <0.3 mg/dL (ref 0.20–1.20)
Total Protein: 6.7 g/dL (ref 6.4–8.3)

## 2015-06-28 NOTE — Progress Notes (Signed)
[facility] PROGRESS NOTE DATE OF VISIT: 06/28/2015  Eric Solo, MD [place] [id number]  DIAGNOSIS: Rectosigmoid cancer, stage IV   CURRENT THERAPY:  Observation.  INTERVAL HISTORY:  Eric Munoz 75 y.o. 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 He was last seen by me about 2 months ago, and he is moving to [place] next week. He is doing well overall, denies any significant pain, nausea, change of bowel habit, hematochezia or other bleeding. He has mild fatigue, but functions well at home, overall stable. No other new complaints.   MEDICAL HISTORY: Past Medical History  Diagnosis Date  . Urinoma, s/p bladder repair   . History of acute renal failure nov 2012  . Colovesical fistula   . S/P ileostomy ([facility])     12-20-2010  . Foley catheter in place   . Nephrostomy status ([place]) changed -10/26/2014    CURRENTLY HAS BILATERAL TUBES w/ changes every 6 weeks  . History of colon cancer, stage IV     2007 w/ recurrence july 2011--  RECTOSIGMOID [id number] s/p left coloectomy/ lar--  chemo complete feb 2012;  rxt complete jan 2012----   . Iron deficiency anemia   . Ureteral stricture     bilateral--  bilateral ureteral stents w/  changes every 6 months approx.  . Chronic kidney disease (CKD), stage III (moderate) SECONDARY UNCLEAR ETIOLOGY, POSSIBLE RELATED RADIATION THERAPY      NEPHROLOGIST,  DR Marval Regal  . Acute-on-chronic kidney injury ([place])     SECONDARY TO OBSTRUCTIVE UROPATHY--  BILATERAL URETERAL STENTS, BILATERAL NEPHROSTOMY TUBES AND FOLEY CATHETER  . Rectosigmoid cancer ([place])     stage iv--  no recurrent mestastatic disease--  oncologist--  dr Shanon Brow chism/ dr Bernadene Bell ([facility])  . Difficult intubation  ASA IV W/ MAY 2013 AT [facility]  MAIN OR--  INTUBATED OVER BOUGIE  BY DR FORTUNE    PT DID OK W/ LMA AT [facility] '13,'14,'15 -DR ROSE &  dr fortune at [facility] 02-18-2013    INTERIM HISTORY: has History of adenomatous polyp of rectum s/p low LAR; History of colon cancer, stage IV, Stage 4, [id number] (Chem last Feb12/ Radiation-last Jan12); Ureteral stricture, left; Loop diverting ileostomy, [id number]; Colourethral fistula ; Renal insufficiency; Unspecified deficiency anemia; Tachycardia; UTI (lower urinary tract infection); Acute renal failure in the setting of indwelling ureteral stents and bilateral hydronephrosis; Hyperkalemia; Metabolic acidosis; Normocytic anemia; Rectosigmoid cancer, stage IV; Pyonephrosis; Hypokalemia; and Anemia in chronic kidney disease on his problem list.    ALLERGIES:  has No Known Allergies.  MEDICATIONS: has a current medication list which includes the following prescription(s): ferrous sulfate and multivitamin with minerals.  SURGICAL HISTORY:  Past Surgical History  Procedure Laterality Date  . Portacath placement  09-17-2009  . Colostomy takedown  12/20/2010    Procedure: LAPAROSCOPIC COLOSTOMY TAKEDOWN;  Surgeon: Adin Hector, MD;  Location: [facility];  Service: General;  Laterality: N/A;  Laparoscopy Colostomy Takedown With Ileostomy  . Port-a-cath removal  12/20/2010    Procedure: REMOVAL PORT-A-CATH;  Surgeon: Adin Hector, MD;  Location: [facility];  Service: General;  Laterality: Right;  Right  venous catheter no longer needed.  . Cystoscopy w/ ureteral stent placement  01/13/2011    Procedure: CYSTOSCOPY WITH RETROGRADE PYELOGRAM/URETERAL STENT PLACEMENT;  Surgeon: Franchot Gallo;  Location: [facility];  Service: Urology;  Laterality: Left;  intra-operative cystogram  . Tonsillectomy  yrs ago  . Fracture surgery  1950'S    RIGHT ARM SURGERY FOR FX  . Examination under anesthesia  06/19/2011    Procedure: EXAM UNDER ANESTHESIA;  Surgeon: Adin Hector, MD;  Location: [facility];  Service: General;;   . Cystoscopy w/ ureteral stent placement  06/19/2011    Procedure: CYSTOSCOPY WITH RETROGRADE PYELOGRAM/URETERAL STENT PLACEMENT;  Surgeon: Franchot Gallo, MD;  Location: [facility];  Service: Urology;  Laterality: Bilateral;  Cystoscopy/Bilateral Double J Stent insertion , flexible cystoscopy   . Cystoscopy w/ ureteral stent placement  10/24/2011    Procedure: CYSTOSCOPY WITH STENT REPLACEMENT;  Surgeon: Franchot Gallo, MD;  Location[facility];  Service: Urology;  Laterality: Bilateral;  45 mins requested for this case   . Cystoscopy w/ retrogrades  10/24/2011    Procedure: CYSTOSCOPY WITH RETROGRADE PYELOGRAM;  Surgeon: Franchot Gallo, MD;  Location: [facility];  Service: Urology;  Laterality: Left;  . Transanal resection of rectal polyp and sigmoid colectomy  03/15/2004  . Left hydrocele surgery  03/16/2009  . Cystoscopy w/ ureteral stent placement Bilateral 04/09/2012    Procedure: CYSTOSCOPY WITH bilateral  STENT REPLACEMENT;  Surgeon: Franchot Gallo, MD;  Location: [facility];  Service: Urology;  Laterality: Bilateral;  . Cysto/ left retrograde pyelogram/ left stent placement  03-01-2010  &  09-13-2010  . Multiple nephrostomy tube changes  every six weeks  . Colon surgery  08/07/09    Extensive lysis adhesions/ Re-do Low Anterior Rescection, Reconstruction left external iliac artery/ Left ureterolysis and ureter disposition  . Cystoscopy w/ ureteral stent placement Bilateral 12/13/2012    Procedure: CYSTOSCOPY WITH STENT REPLACEMENT;  Surgeon: Franchot Gallo, MD;  Location: [facility];  Service: Urology;  Laterality: Bilateral;  . Cystoscopy with ureteroscopy and stent placement Bilateral 02/18/2013    Procedure: CYSTOSCOPY WITH URETEROSCOPY AND BILATERAL STENT EXCHANGE;  Surgeon: Franchot Gallo, MD;  Location: [facility];  Service: Urology;  Laterality: Bilateral;  . Cystoscopy w/ ureteral stent  placement Bilateral 01/13/2014    Procedure: CYSTOSCOPY WITH STENT REPLACEMENT;  Surgeon: Jorja Loa, MD;  Location: [facility];  Service: Urology;  Laterality: Bilateral;  . Cystoscopy w/ ureteral stent placement Bilateral 11/03/2014    Procedure: CYSTOSCOPY WITH STENT REPLACEMENT;  Surgeon: Franchot Gallo, MD;  Location: [facility];  Service: Urology;  Laterality: Bilateral;   PROBLEM LIST: 1. Adenocarcinoma of the rectosigmoid stage IVA, T4b N2a M1a cancer involving with involvement of periaortic lymph node. Diagnosis was established in July of 2011. The patient underwent rather extensive surgery on both July 5th and August 08, 2009. There were multiple tumor nodules. Six out of 26 lymph nodes were involved along with adjacent small bowel. There was involvement of a periaortic lymph node. There was a close radial margin. There was tumor thrombus in a vein. There was extracapsular extension, lymphovascular and perineural invasion, invasion of tumor into the left external iliac artery and left ureter. KRAS mutation was not detected, and thus the tumor is KRAS wild type. Surgery involved a colostomy and placement of a left ureteral stent. There was a question of whether this represented a de novo cancer or possibly recurrence from a previous sigmoid well-differentiated adenocarcinoma that was resected by Dr. Georganna Skeans on 03/15/2004. This tumor arose apparently in a polyp with 0/3 lymph nodes and was felt  to be T1 stage I. The margin, however, was 0.5 mm. After the patient's surgery in July 2011, he received 7 cycles of FOLFOX from 09/26/2009 through 12/24/2009. The patient received pelvic radiation with continuous infusion 5-fluorouracil from 01/07/2010 through 02/15/2009. The patient then received 3 cycles of 5-FU leucovorin and 5-FU by continuous infusion from 03/04/2010 through 04/01/2010. 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 PET scan  carried out on 06/16/2011 showed no evidence of recurrent or metastatic disease.  2. Ileostomy, 12/20/2010.  3. Right leg weakness secondary to femoral nerve injuries following  the patient's surgery in July 2011. Currently resolved.  4. The patient underwent extensive surgery on December 20, 2010. This consisted of lysis of adhesions, laparoscopic-assisted splenic flexure mobilization, laparoscopic colostomy takedown, primary repair of bladder injury, diverting loop ileostomy in the right upper quadrant, and removal of the patient's Port-A-Cath.  5. Bilateral ureteral stents, followed by Dr. Diona Fanti.  6. 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  7. Anemia noted in January 2013 with a prior history of iron deficiency anemia requiring intravenous Feraheme 510 mg on 09/26/2009 and on 10/10/2009. Renal insufficiency is probably contributing to this patient's anemia at the present time.  8. Development of renal insufficiency noted in May 2013 with acute decompensation noted on 11/04/2011.  9. Bilateral percutaneous nephrostomies carried out on January 10, 2012 for bilateral hydronephrosis. These tubes were removed in April 2014 but needed to be reinserted on 06/15/2012 because of renal decompensation.   REVIEW OF SYSTEMS:   Constitutional: Denies fevers, chills or abnormal weight loss Eyes: Denies blurriness of vision Ears, nose, mouth, throat, and face: Denies mucositis or sore throat Respiratory: Denies cough, dyspnea or wheezes Cardiovascular: Denies palpitation, chest discomfort or lower extremity swelling Gastrointestinal:  Denies nausea, heartburn or change in bowel habits Skin: Denies abnormal skin rashes Lymphatics: Denies new lymphadenopathy or easy bruising Neurological:Denies numbness, tingling or new weaknesses Behavioral/Psych: Mood is stable, no new changes  All other systems were reviewed with the patient and are negative.  PHYSICAL EXAMINATION: ECOG PERFORMANCE  STATUS: 1 Blood pressure 118/69, pulse 59, temperature 97.7 F (36.5 C), temperature source Oral, resp. rate 18, height 5' 9" (1.753 m), weight 144 lb 4.8 oz (65.454 kg), SpO2 100 %. GENERAL:alert, no distress and comfortable thin middle-aged male SKIN: skin color, texture, turgor are normal, no rashes or significant lesions EYES: normal, Conjunctiva are pink and non-injected, sclera clear OROPHARYNX:no exudate, no erythema and lips, buccal mucosa, and tongue normal  NECK: supple, thyroid normal size, non-tender, without nodularity LYMPH:  no palpable lymphadenopathy in the cervical, axillary or supraclavicular LUNGS: clear to auscultation and percussion with normal breathing effort HEART: regular rate & rhythm and no murmurs and one plus pretibial edema (left greater than right) ABDOMEN:abdomen soft, non-tender and normal bowel sounds; ileostomy bag in right mid abdomen; he does also have bilateral nephrostomy tubes in place. Also has a Foley catheter with urine that is clear. Musculoskeletal:no cyanosis of digits and no clubbing  NEURO: alert & oriented x 3 with fluent speech, no focal motor/sensory deficits   LABORATORY DATA: CBC Latest Ref Rng 06/28/2015 05/07/2015 11/06/2014  WBC 4.0 - 10.3 10e3/uL 4.7 4.7 6.3  Hemoglobin 13.0 - 17.1 g/dL 9.5(L) 9.6(L) 10.1(L)  Hematocrit 38.4 - 49.9 % 29.1(L) 29.0(L) 29.6(L)  Platelets 140 - 400 10e3/uL 247 230 241    CMP Latest Ref Rng 06/28/2015 05/07/2015 11/06/2014  Glucose 70 - 140 mg/dl 84 69(L) 81  BUN 7.0 - 26.0 mg/dL 65.6(H) 53.6(H) 61.4(H)  Creatinine 0.7 - 1.3 mg/dL 4.4(HH) 3.9(HH) 4.4(HH)  Sodium 136 - 145 mEq/L 143 142 141  Potassium 3.5 - 5.1 mEq/L 4.3 4.0 4.5  CO2 22 - 29 mEq/L 17(L) 18(L) 14(L)  Calcium 8.4 - 10.4 mg/dL 9.0 9.1 9.3  Total Protein 6.4 - 8.3 g/dL 6.7 6.5 6.6  Total Bilirubin 0.20 - 1.20 mg/dL <0.30 <0.30 <0.30  Alkaline Phos 40 - 150 U/L 53 51 50  AST 5 - 34 U/L _0 ALT 0 - 55 U/L 14 15 <9   CEA (Order  [id number])      CEA  Status: Finalresult Visible to patient:  Not Released Nextappt: None Dx:  Rectosigmoid cancer, stage IV           Ref Range 4d ago  40moago     CEA 0.0 - 4.7 ng/mL 3.2 4.1CM        Results for DBYNUM, MCCULLARS(MRN [id number] as of 07/02/2015 21:12  Ref. Range 10/31/2013 09:55 05/07/2015 09:58  Iron Latest Ref Range: 42-163 ug/dL 61 33 (L)  UIBC Latest Ref Range: 117-376 ug/dL 232 205  TIBC Latest Ref Range: 202-409 ug/dL 293 239  %SAT Latest Ref Range: 20-55 % 21 14 (L)  Ferritin Latest Ref Range: 22-316 ng/ml 49 163   RADIOGRAPHIC STUDIES: PET 05/23/2014 IMPRESSION: 1. No evidence of local recurrence or metastatic disease. 2. Bilateral nephro ureteral catheters remain in place.   ASSESSMENT: JOSHAE SIMMERING89y.o. male with a history of stage 4 colon cancer in remission.   1. Stage IV rectosigmoid adenocarcinoma with periaortic lymph nodes metastasis, NED now  -It's almost 6 years since his initial diagnosis. He is clinically doing very well without any symptoms. His physical exam today was normal. -His last PET scan was  Done in April 2016, which showed no evidence of disease recurrence. -His CEA continues to be normal, lab results are unremarkable except anemia and chronic kidney disease. -His risk of cancer recurrence is very low now.  We'll continue clinical surveillance with lab and exam.   I do not plan to repeat routine scan,  Unless there is clinical or lab suspicion for recurrence. [place] encouraged him to establish his care with a local hematologist and oncologist.  2. Anemia, secondary to CKD and history of IDA -His anemia has been gradually getting worse in the past year, hemoglobin 9.5 today, properly relates to his CKD. -We'll continue monitoring.  His recently iron study showed slightly low serum iron and transferrin saturation, normal ferritin. -continue oral iron once a daily.  - we also  discussed the role of ESA,  If his anemia gets worse to the level of below 9.5, I would consider ESA, if he remains to be no evidence of recurrence from his colon cancer  3. CKD, stage IV -He will continue to follow-up with his urologist and have nephrostomy tube exchange periodically.   Follow[place]up [place] encouraged him to call me when he establishes care with his new primary care physician, and a local hematologist and oncologist. I'll fax his medical records to his new physicians. -He knows to call me as needed in the future.  I spent 10 minutes counseling the patient face to face. The total time spent in the appointment was 15 minutes.  FTruitt Merle 06/28/2015

## 2015-06-29 LAB — CEA: CEA: 3.2 ng/mL (ref 0.0–4.7)

## 2015-06-29 LAB — CEA (PARALLEL TESTING): CEA: 0.7 ng/mL

## 2015-07-10 ENCOUNTER — Other Ambulatory Visit: Payer: Medicare Other

## 2015-07-10 ENCOUNTER — Ambulatory Visit: Payer: Medicare Other | Admitting: Hematology

## 2015-07-23 DIAGNOSIS — N135 Crossing vessel and stricture of ureter without hydronephrosis: Secondary | ICD-10-CM | POA: Diagnosis not present

## 2015-07-23 DIAGNOSIS — Z932 Ileostomy status: Secondary | ICD-10-CM | POA: Diagnosis not present

## 2015-07-23 DIAGNOSIS — N360 Urethral fistula: Secondary | ICD-10-CM | POA: Diagnosis not present

## 2015-07-23 DIAGNOSIS — Z85038 Personal history of other malignant neoplasm of large intestine: Secondary | ICD-10-CM | POA: Diagnosis not present

## 2015-07-23 DIAGNOSIS — R7989 Other specified abnormal findings of blood chemistry: Secondary | ICD-10-CM | POA: Diagnosis not present

## 2015-07-23 DIAGNOSIS — Z923 Personal history of irradiation: Secondary | ICD-10-CM | POA: Diagnosis not present

## 2015-07-23 DIAGNOSIS — K604 Rectal fistula: Secondary | ICD-10-CM | POA: Diagnosis not present

## 2015-07-23 DIAGNOSIS — Z9221 Personal history of antineoplastic chemotherapy: Secondary | ICD-10-CM | POA: Diagnosis not present

## 2015-07-23 DIAGNOSIS — Z87891 Personal history of nicotine dependence: Secondary | ICD-10-CM | POA: Diagnosis not present

## 2015-07-23 DIAGNOSIS — Z936 Other artificial openings of urinary tract status: Secondary | ICD-10-CM | POA: Diagnosis not present

## 2015-07-23 DIAGNOSIS — N184 Chronic kidney disease, stage 4 (severe): Secondary | ICD-10-CM | POA: Diagnosis not present

## 2015-07-23 DIAGNOSIS — Z79899 Other long term (current) drug therapy: Secondary | ICD-10-CM | POA: Diagnosis not present

## 2015-07-23 DIAGNOSIS — Z960 Presence of urogenital implants: Secondary | ICD-10-CM | POA: Diagnosis not present

## 2015-07-25 DIAGNOSIS — N135 Crossing vessel and stricture of ureter without hydronephrosis: Secondary | ICD-10-CM | POA: Diagnosis not present

## 2015-07-25 DIAGNOSIS — D649 Anemia, unspecified: Secondary | ICD-10-CM | POA: Diagnosis not present

## 2015-07-25 DIAGNOSIS — Z923 Personal history of irradiation: Secondary | ICD-10-CM | POA: Diagnosis not present

## 2015-07-25 DIAGNOSIS — Z9221 Personal history of antineoplastic chemotherapy: Secondary | ICD-10-CM | POA: Diagnosis not present

## 2015-07-25 DIAGNOSIS — Z85038 Personal history of other malignant neoplasm of large intestine: Secondary | ICD-10-CM | POA: Diagnosis not present

## 2015-07-25 DIAGNOSIS — N360 Urethral fistula: Secondary | ICD-10-CM | POA: Diagnosis not present

## 2015-08-01 DIAGNOSIS — R7989 Other specified abnormal findings of blood chemistry: Secondary | ICD-10-CM | POA: Diagnosis not present

## 2015-08-01 DIAGNOSIS — E875 Hyperkalemia: Secondary | ICD-10-CM | POA: Diagnosis not present

## 2015-08-01 DIAGNOSIS — I129 Hypertensive chronic kidney disease with stage 1 through stage 4 chronic kidney disease, or unspecified chronic kidney disease: Secondary | ICD-10-CM | POA: Diagnosis not present

## 2015-08-01 DIAGNOSIS — D72829 Elevated white blood cell count, unspecified: Secondary | ICD-10-CM | POA: Diagnosis not present

## 2015-08-01 DIAGNOSIS — N170 Acute kidney failure with tubular necrosis: Secondary | ICD-10-CM | POA: Diagnosis not present

## 2015-08-01 DIAGNOSIS — N179 Acute kidney failure, unspecified: Secondary | ICD-10-CM | POA: Diagnosis not present

## 2015-08-01 DIAGNOSIS — N185 Chronic kidney disease, stage 5: Secondary | ICD-10-CM | POA: Diagnosis not present

## 2015-08-01 DIAGNOSIS — D649 Anemia, unspecified: Secondary | ICD-10-CM | POA: Diagnosis not present

## 2015-08-01 DIAGNOSIS — R809 Proteinuria, unspecified: Secondary | ICD-10-CM | POA: Diagnosis not present

## 2015-08-01 DIAGNOSIS — N19 Unspecified kidney failure: Secondary | ICD-10-CM | POA: Diagnosis not present

## 2015-08-01 DIAGNOSIS — N184 Chronic kidney disease, stage 4 (severe): Secondary | ICD-10-CM | POA: Diagnosis not present

## 2015-08-01 DIAGNOSIS — E872 Acidosis: Secondary | ICD-10-CM | POA: Diagnosis not present

## 2015-08-01 DIAGNOSIS — D631 Anemia in chronic kidney disease: Secondary | ICD-10-CM | POA: Diagnosis not present

## 2015-08-01 DIAGNOSIS — N131 Hydronephrosis with ureteral stricture, not elsewhere classified: Secondary | ICD-10-CM | POA: Diagnosis not present

## 2015-08-01 DIAGNOSIS — R000 Tachycardia, unspecified: Secondary | ICD-10-CM | POA: Diagnosis not present

## 2015-08-01 DIAGNOSIS — N10 Acute tubulo-interstitial nephritis: Secondary | ICD-10-CM | POA: Diagnosis not present

## 2015-08-01 DIAGNOSIS — R64 Cachexia: Secondary | ICD-10-CM | POA: Diagnosis not present

## 2015-08-02 DIAGNOSIS — I998 Other disorder of circulatory system: Secondary | ICD-10-CM | POA: Diagnosis not present

## 2015-08-02 DIAGNOSIS — B379 Candidiasis, unspecified: Secondary | ICD-10-CM | POA: Diagnosis not present

## 2015-08-02 DIAGNOSIS — N170 Acute kidney failure with tubular necrosis: Secondary | ICD-10-CM | POA: Diagnosis present

## 2015-08-02 DIAGNOSIS — N3289 Other specified disorders of bladder: Secondary | ICD-10-CM | POA: Diagnosis not present

## 2015-08-02 DIAGNOSIS — N138 Other obstructive and reflux uropathy: Secondary | ICD-10-CM | POA: Diagnosis not present

## 2015-08-02 DIAGNOSIS — Z85048 Personal history of other malignant neoplasm of rectum, rectosigmoid junction, and anus: Secondary | ICD-10-CM | POA: Diagnosis not present

## 2015-08-02 DIAGNOSIS — N139 Obstructive and reflux uropathy, unspecified: Secondary | ICD-10-CM | POA: Diagnosis not present

## 2015-08-02 DIAGNOSIS — Z85038 Personal history of other malignant neoplasm of large intestine: Secondary | ICD-10-CM | POA: Diagnosis not present

## 2015-08-02 DIAGNOSIS — B9689 Other specified bacterial agents as the cause of diseases classified elsewhere: Secondary | ICD-10-CM | POA: Diagnosis not present

## 2015-08-02 DIAGNOSIS — R8299 Other abnormal findings in urine: Secondary | ICD-10-CM | POA: Diagnosis not present

## 2015-08-02 DIAGNOSIS — D649 Anemia, unspecified: Secondary | ICD-10-CM | POA: Diagnosis not present

## 2015-08-02 DIAGNOSIS — E039 Hypothyroidism, unspecified: Secondary | ICD-10-CM | POA: Diagnosis present

## 2015-08-02 DIAGNOSIS — N390 Urinary tract infection, site not specified: Secondary | ICD-10-CM | POA: Diagnosis not present

## 2015-08-02 DIAGNOSIS — Z960 Presence of urogenital implants: Secondary | ICD-10-CM | POA: Diagnosis not present

## 2015-08-02 DIAGNOSIS — N289 Disorder of kidney and ureter, unspecified: Secondary | ICD-10-CM | POA: Diagnosis not present

## 2015-08-02 DIAGNOSIS — N131 Hydronephrosis with ureteral stricture, not elsewhere classified: Secondary | ICD-10-CM | POA: Diagnosis present

## 2015-08-02 DIAGNOSIS — Z9221 Personal history of antineoplastic chemotherapy: Secondary | ICD-10-CM | POA: Diagnosis not present

## 2015-08-02 DIAGNOSIS — N189 Chronic kidney disease, unspecified: Secondary | ICD-10-CM | POA: Diagnosis not present

## 2015-08-02 DIAGNOSIS — D509 Iron deficiency anemia, unspecified: Secondary | ICD-10-CM | POA: Diagnosis not present

## 2015-08-02 DIAGNOSIS — R748 Abnormal levels of other serum enzymes: Secondary | ICD-10-CM | POA: Diagnosis not present

## 2015-08-02 DIAGNOSIS — N185 Chronic kidney disease, stage 5: Secondary | ICD-10-CM | POA: Diagnosis present

## 2015-08-02 DIAGNOSIS — E876 Hypokalemia: Secondary | ICD-10-CM | POA: Diagnosis not present

## 2015-08-02 DIAGNOSIS — E872 Acidosis: Secondary | ICD-10-CM | POA: Diagnosis not present

## 2015-08-02 DIAGNOSIS — R310 Gross hematuria: Secondary | ICD-10-CM | POA: Diagnosis not present

## 2015-08-02 DIAGNOSIS — N10 Acute tubulo-interstitial nephritis: Secondary | ICD-10-CM | POA: Diagnosis present

## 2015-08-02 DIAGNOSIS — E875 Hyperkalemia: Secondary | ICD-10-CM | POA: Diagnosis present

## 2015-08-02 DIAGNOSIS — Z9049 Acquired absence of other specified parts of digestive tract: Secondary | ICD-10-CM | POA: Diagnosis not present

## 2015-08-02 DIAGNOSIS — Z923 Personal history of irradiation: Secondary | ICD-10-CM | POA: Diagnosis not present

## 2015-08-02 DIAGNOSIS — C19 Malignant neoplasm of rectosigmoid junction: Secondary | ICD-10-CM | POA: Diagnosis not present

## 2015-08-02 DIAGNOSIS — Z87891 Personal history of nicotine dependence: Secondary | ICD-10-CM | POA: Diagnosis not present

## 2015-08-02 DIAGNOSIS — R64 Cachexia: Secondary | ICD-10-CM | POA: Diagnosis present

## 2015-08-02 DIAGNOSIS — Z932 Ileostomy status: Secondary | ICD-10-CM | POA: Diagnosis not present

## 2015-08-02 DIAGNOSIS — R000 Tachycardia, unspecified: Secondary | ICD-10-CM | POA: Diagnosis not present

## 2015-08-02 DIAGNOSIS — D631 Anemia in chronic kidney disease: Secondary | ICD-10-CM | POA: Diagnosis not present

## 2015-08-02 DIAGNOSIS — N19 Unspecified kidney failure: Secondary | ICD-10-CM | POA: Diagnosis not present

## 2015-08-02 DIAGNOSIS — N179 Acute kidney failure, unspecified: Secondary | ICD-10-CM | POA: Diagnosis not present

## 2015-08-02 DIAGNOSIS — B961 Klebsiella pneumoniae [K. pneumoniae] as the cause of diseases classified elsewhere: Secondary | ICD-10-CM | POA: Diagnosis not present

## 2015-08-02 DIAGNOSIS — Z6820 Body mass index (BMI) 20.0-20.9, adult: Secondary | ICD-10-CM | POA: Diagnosis not present

## 2015-08-02 DIAGNOSIS — D72829 Elevated white blood cell count, unspecified: Secondary | ICD-10-CM | POA: Diagnosis not present

## 2015-08-02 DIAGNOSIS — N1330 Unspecified hydronephrosis: Secondary | ICD-10-CM | POA: Diagnosis not present

## 2015-08-02 DIAGNOSIS — N2589 Other disorders resulting from impaired renal tubular function: Secondary | ICD-10-CM | POA: Diagnosis not present

## 2015-08-02 DIAGNOSIS — N12 Tubulo-interstitial nephritis, not specified as acute or chronic: Secondary | ICD-10-CM | POA: Diagnosis not present

## 2015-08-11 DIAGNOSIS — Z85038 Personal history of other malignant neoplasm of large intestine: Secondary | ICD-10-CM | POA: Diagnosis not present

## 2015-08-11 DIAGNOSIS — N185 Chronic kidney disease, stage 5: Secondary | ICD-10-CM | POA: Diagnosis not present

## 2015-08-11 DIAGNOSIS — N184 Chronic kidney disease, stage 4 (severe): Secondary | ICD-10-CM | POA: Diagnosis not present

## 2015-08-11 DIAGNOSIS — E039 Hypothyroidism, unspecified: Secondary | ICD-10-CM | POA: Diagnosis not present

## 2015-08-11 DIAGNOSIS — N2889 Other specified disorders of kidney and ureter: Secondary | ICD-10-CM | POA: Diagnosis not present

## 2015-08-11 DIAGNOSIS — Z87891 Personal history of nicotine dependence: Secondary | ICD-10-CM | POA: Diagnosis not present

## 2015-08-11 DIAGNOSIS — Z9049 Acquired absence of other specified parts of digestive tract: Secondary | ICD-10-CM | POA: Diagnosis not present

## 2015-08-11 DIAGNOSIS — N179 Acute kidney failure, unspecified: Secondary | ICD-10-CM | POA: Diagnosis not present

## 2015-08-11 DIAGNOSIS — D631 Anemia in chronic kidney disease: Secondary | ICD-10-CM | POA: Diagnosis not present

## 2015-08-11 DIAGNOSIS — N1330 Unspecified hydronephrosis: Secondary | ICD-10-CM | POA: Diagnosis not present

## 2015-08-11 DIAGNOSIS — T83098A Other mechanical complication of other indwelling urethral catheter, initial encounter: Secondary | ICD-10-CM | POA: Diagnosis not present

## 2015-08-11 DIAGNOSIS — Z960 Presence of urogenital implants: Secondary | ICD-10-CM | POA: Diagnosis not present

## 2015-08-11 DIAGNOSIS — Z933 Colostomy status: Secondary | ICD-10-CM | POA: Diagnosis not present

## 2015-08-11 DIAGNOSIS — E872 Acidosis: Secondary | ICD-10-CM | POA: Diagnosis not present

## 2015-08-12 DIAGNOSIS — N179 Acute kidney failure, unspecified: Secondary | ICD-10-CM | POA: Diagnosis not present

## 2015-08-12 DIAGNOSIS — D631 Anemia in chronic kidney disease: Secondary | ICD-10-CM | POA: Diagnosis not present

## 2015-08-12 DIAGNOSIS — Z9049 Acquired absence of other specified parts of digestive tract: Secondary | ICD-10-CM | POA: Diagnosis not present

## 2015-08-12 DIAGNOSIS — N185 Chronic kidney disease, stage 5: Secondary | ICD-10-CM | POA: Diagnosis not present

## 2015-08-12 DIAGNOSIS — T83098A Other mechanical complication of other indwelling urethral catheter, initial encounter: Secondary | ICD-10-CM | POA: Diagnosis not present

## 2015-08-12 DIAGNOSIS — E872 Acidosis: Secondary | ICD-10-CM | POA: Diagnosis not present

## 2015-08-12 DIAGNOSIS — Z85038 Personal history of other malignant neoplasm of large intestine: Secondary | ICD-10-CM | POA: Diagnosis not present

## 2015-08-13 DIAGNOSIS — N179 Acute kidney failure, unspecified: Secondary | ICD-10-CM | POA: Diagnosis not present

## 2015-08-13 DIAGNOSIS — Z85038 Personal history of other malignant neoplasm of large intestine: Secondary | ICD-10-CM | POA: Diagnosis not present

## 2015-08-13 DIAGNOSIS — N185 Chronic kidney disease, stage 5: Secondary | ICD-10-CM | POA: Diagnosis not present

## 2015-08-13 DIAGNOSIS — E873 Alkalosis: Secondary | ICD-10-CM | POA: Diagnosis not present

## 2015-08-13 DIAGNOSIS — D631 Anemia in chronic kidney disease: Secondary | ICD-10-CM | POA: Diagnosis not present

## 2015-08-13 DIAGNOSIS — R7989 Other specified abnormal findings of blood chemistry: Secondary | ICD-10-CM | POA: Diagnosis not present

## 2015-08-13 DIAGNOSIS — E872 Acidosis: Secondary | ICD-10-CM | POA: Diagnosis not present

## 2015-08-13 DIAGNOSIS — N99522 Malfunction of other external stoma of urinary tract: Secondary | ICD-10-CM | POA: Diagnosis not present

## 2015-08-14 DIAGNOSIS — N99522 Malfunction of other external stoma of urinary tract: Secondary | ICD-10-CM | POA: Diagnosis not present

## 2015-08-14 DIAGNOSIS — E873 Alkalosis: Secondary | ICD-10-CM | POA: Diagnosis not present

## 2015-08-14 DIAGNOSIS — Z436 Encounter for attention to other artificial openings of urinary tract: Secondary | ICD-10-CM | POA: Diagnosis not present

## 2015-08-14 DIAGNOSIS — D631 Anemia in chronic kidney disease: Secondary | ICD-10-CM | POA: Diagnosis not present

## 2015-08-14 DIAGNOSIS — Z466 Encounter for fitting and adjustment of urinary device: Secondary | ICD-10-CM | POA: Diagnosis not present

## 2015-08-14 DIAGNOSIS — E872 Acidosis: Secondary | ICD-10-CM | POA: Diagnosis not present

## 2015-08-14 DIAGNOSIS — R7989 Other specified abnormal findings of blood chemistry: Secondary | ICD-10-CM | POA: Diagnosis not present

## 2015-08-14 DIAGNOSIS — Z85038 Personal history of other malignant neoplasm of large intestine: Secondary | ICD-10-CM | POA: Diagnosis not present

## 2015-08-14 DIAGNOSIS — N179 Acute kidney failure, unspecified: Secondary | ICD-10-CM | POA: Diagnosis not present

## 2015-08-14 DIAGNOSIS — N185 Chronic kidney disease, stage 5: Secondary | ICD-10-CM | POA: Diagnosis not present

## 2015-08-17 DIAGNOSIS — N185 Chronic kidney disease, stage 5: Secondary | ICD-10-CM | POA: Diagnosis not present

## 2015-08-17 DIAGNOSIS — Z9221 Personal history of antineoplastic chemotherapy: Secondary | ICD-10-CM | POA: Diagnosis not present

## 2015-08-17 DIAGNOSIS — R636 Underweight: Secondary | ICD-10-CM | POA: Diagnosis not present

## 2015-08-17 DIAGNOSIS — Z936 Other artificial openings of urinary tract status: Secondary | ICD-10-CM | POA: Diagnosis not present

## 2015-08-17 DIAGNOSIS — Z85038 Personal history of other malignant neoplasm of large intestine: Secondary | ICD-10-CM | POA: Diagnosis not present

## 2015-08-22 DIAGNOSIS — Z87891 Personal history of nicotine dependence: Secondary | ICD-10-CM | POA: Diagnosis not present

## 2015-08-22 DIAGNOSIS — N139 Obstructive and reflux uropathy, unspecified: Secondary | ICD-10-CM | POA: Diagnosis not present

## 2015-08-22 DIAGNOSIS — E559 Vitamin D deficiency, unspecified: Secondary | ICD-10-CM | POA: Diagnosis not present

## 2015-08-22 DIAGNOSIS — D649 Anemia, unspecified: Secondary | ICD-10-CM | POA: Diagnosis not present

## 2015-08-22 DIAGNOSIS — E46 Unspecified protein-calorie malnutrition: Secondary | ICD-10-CM | POA: Diagnosis not present

## 2015-08-22 DIAGNOSIS — Z85038 Personal history of other malignant neoplasm of large intestine: Secondary | ICD-10-CM | POA: Diagnosis not present

## 2015-08-22 DIAGNOSIS — E440 Moderate protein-calorie malnutrition: Secondary | ICD-10-CM | POA: Diagnosis not present

## 2015-08-22 DIAGNOSIS — N185 Chronic kidney disease, stage 5: Secondary | ICD-10-CM | POA: Diagnosis not present

## 2015-08-23 DIAGNOSIS — E559 Vitamin D deficiency, unspecified: Secondary | ICD-10-CM | POA: Diagnosis not present

## 2015-08-23 DIAGNOSIS — R7989 Other specified abnormal findings of blood chemistry: Secondary | ICD-10-CM | POA: Diagnosis not present

## 2015-08-23 DIAGNOSIS — E039 Hypothyroidism, unspecified: Secondary | ICD-10-CM | POA: Diagnosis not present

## 2015-08-23 DIAGNOSIS — Z0000 Encounter for general adult medical examination without abnormal findings: Secondary | ICD-10-CM | POA: Diagnosis not present

## 2015-08-23 DIAGNOSIS — N185 Chronic kidney disease, stage 5: Secondary | ICD-10-CM | POA: Diagnosis not present

## 2015-08-28 ENCOUNTER — Telehealth: Payer: Self-pay | Admitting: Hematology

## 2015-08-28 NOTE — Telephone Encounter (Signed)
Faxed pt records to [facility]

## 2015-08-29 DIAGNOSIS — N2581 Secondary hyperparathyroidism of renal origin: Secondary | ICD-10-CM | POA: Diagnosis not present

## 2015-08-29 DIAGNOSIS — C19 Malignant neoplasm of rectosigmoid junction: Secondary | ICD-10-CM | POA: Diagnosis not present

## 2015-08-29 DIAGNOSIS — I10 Essential (primary) hypertension: Secondary | ICD-10-CM | POA: Diagnosis not present

## 2015-08-29 DIAGNOSIS — R809 Proteinuria, unspecified: Secondary | ICD-10-CM | POA: Diagnosis not present

## 2015-08-29 DIAGNOSIS — N184 Chronic kidney disease, stage 4 (severe): Secondary | ICD-10-CM | POA: Diagnosis not present

## 2015-08-29 DIAGNOSIS — E872 Acidosis: Secondary | ICD-10-CM | POA: Diagnosis not present

## 2015-08-29 DIAGNOSIS — R7989 Other specified abnormal findings of blood chemistry: Secondary | ICD-10-CM | POA: Diagnosis not present

## 2015-08-29 DIAGNOSIS — D631 Anemia in chronic kidney disease: Secondary | ICD-10-CM | POA: Diagnosis not present

## 2015-08-29 DIAGNOSIS — I129 Hypertensive chronic kidney disease with stage 1 through stage 4 chronic kidney disease, or unspecified chronic kidney disease: Secondary | ICD-10-CM | POA: Diagnosis not present

## 2015-08-29 DIAGNOSIS — N321 Vesicointestinal fistula: Secondary | ICD-10-CM | POA: Diagnosis not present

## 2015-08-29 DIAGNOSIS — Z85048 Personal history of other malignant neoplasm of rectum, rectosigmoid junction, and anus: Secondary | ICD-10-CM | POA: Diagnosis not present

## 2015-09-01 DIAGNOSIS — N261 Atrophy of kidney (terminal): Secondary | ICD-10-CM | POA: Diagnosis not present

## 2015-09-01 DIAGNOSIS — N321 Vesicointestinal fistula: Secondary | ICD-10-CM | POA: Diagnosis not present

## 2015-09-01 DIAGNOSIS — Z9889 Other specified postprocedural states: Secondary | ICD-10-CM | POA: Diagnosis not present

## 2015-09-01 DIAGNOSIS — N2889 Other specified disorders of kidney and ureter: Secondary | ICD-10-CM | POA: Diagnosis not present

## 2015-09-01 DIAGNOSIS — N135 Crossing vessel and stricture of ureter without hydronephrosis: Secondary | ICD-10-CM | POA: Diagnosis not present

## 2015-09-01 DIAGNOSIS — Z936 Other artificial openings of urinary tract status: Secondary | ICD-10-CM | POA: Diagnosis not present

## 2015-09-01 DIAGNOSIS — C19 Malignant neoplasm of rectosigmoid junction: Secondary | ICD-10-CM | POA: Diagnosis not present

## 2015-09-01 DIAGNOSIS — Z85038 Personal history of other malignant neoplasm of large intestine: Secondary | ICD-10-CM | POA: Diagnosis not present

## 2015-09-06 DIAGNOSIS — Z932 Ileostomy status: Secondary | ICD-10-CM | POA: Diagnosis not present

## 2015-09-06 DIAGNOSIS — Z79899 Other long term (current) drug therapy: Secondary | ICD-10-CM | POA: Diagnosis not present

## 2015-09-06 DIAGNOSIS — N185 Chronic kidney disease, stage 5: Secondary | ICD-10-CM | POA: Diagnosis not present

## 2015-09-06 DIAGNOSIS — Z85048 Personal history of other malignant neoplasm of rectum, rectosigmoid junction, and anus: Secondary | ICD-10-CM | POA: Diagnosis not present

## 2015-09-06 DIAGNOSIS — N360 Urethral fistula: Secondary | ICD-10-CM | POA: Diagnosis not present

## 2015-09-06 DIAGNOSIS — N184 Chronic kidney disease, stage 4 (severe): Secondary | ICD-10-CM | POA: Diagnosis not present

## 2015-09-06 DIAGNOSIS — N135 Crossing vessel and stricture of ureter without hydronephrosis: Secondary | ICD-10-CM | POA: Diagnosis not present

## 2015-09-06 DIAGNOSIS — N186 End stage renal disease: Secondary | ICD-10-CM | POA: Diagnosis not present

## 2015-09-12 DIAGNOSIS — Z932 Ileostomy status: Secondary | ICD-10-CM | POA: Diagnosis not present

## 2015-09-12 DIAGNOSIS — C20 Malignant neoplasm of rectum: Secondary | ICD-10-CM | POA: Diagnosis not present

## 2015-09-18 DIAGNOSIS — D638 Anemia in other chronic diseases classified elsewhere: Secondary | ICD-10-CM | POA: Diagnosis not present

## 2015-09-18 DIAGNOSIS — Z85038 Personal history of other malignant neoplasm of large intestine: Secondary | ICD-10-CM | POA: Diagnosis not present

## 2015-09-18 DIAGNOSIS — Z466 Encounter for fitting and adjustment of urinary device: Secondary | ICD-10-CM | POA: Diagnosis not present

## 2015-09-18 DIAGNOSIS — Z87891 Personal history of nicotine dependence: Secondary | ICD-10-CM | POA: Diagnosis not present

## 2015-09-18 DIAGNOSIS — N185 Chronic kidney disease, stage 5: Secondary | ICD-10-CM | POA: Diagnosis not present

## 2015-09-18 DIAGNOSIS — Z9049 Acquired absence of other specified parts of digestive tract: Secondary | ICD-10-CM | POA: Diagnosis not present

## 2015-09-18 DIAGNOSIS — N131 Hydronephrosis with ureteral stricture, not elsewhere classified: Secondary | ICD-10-CM | POA: Diagnosis not present

## 2015-09-18 DIAGNOSIS — E039 Hypothyroidism, unspecified: Secondary | ICD-10-CM | POA: Diagnosis not present

## 2015-09-18 DIAGNOSIS — Z436 Encounter for attention to other artificial openings of urinary tract: Secondary | ICD-10-CM | POA: Diagnosis not present

## 2015-09-24 DIAGNOSIS — Z9221 Personal history of antineoplastic chemotherapy: Secondary | ICD-10-CM | POA: Diagnosis not present

## 2015-09-24 DIAGNOSIS — Z932 Ileostomy status: Secondary | ICD-10-CM | POA: Diagnosis not present

## 2015-09-24 DIAGNOSIS — Z85038 Personal history of other malignant neoplasm of large intestine: Secondary | ICD-10-CM | POA: Diagnosis not present

## 2015-09-24 DIAGNOSIS — N184 Chronic kidney disease, stage 4 (severe): Secondary | ICD-10-CM | POA: Diagnosis not present

## 2015-09-24 DIAGNOSIS — D649 Anemia, unspecified: Secondary | ICD-10-CM | POA: Diagnosis not present

## 2015-09-24 DIAGNOSIS — Z87891 Personal history of nicotine dependence: Secondary | ICD-10-CM | POA: Diagnosis not present

## 2015-09-24 DIAGNOSIS — Z923 Personal history of irradiation: Secondary | ICD-10-CM | POA: Diagnosis not present

## 2015-09-24 DIAGNOSIS — N139 Obstructive and reflux uropathy, unspecified: Secondary | ICD-10-CM | POA: Diagnosis not present

## 2015-10-01 DIAGNOSIS — N185 Chronic kidney disease, stage 5: Secondary | ICD-10-CM | POA: Diagnosis not present

## 2015-10-01 DIAGNOSIS — Z681 Body mass index (BMI) 19 or less, adult: Secondary | ICD-10-CM | POA: Diagnosis not present

## 2015-10-01 DIAGNOSIS — N184 Chronic kidney disease, stage 4 (severe): Secondary | ICD-10-CM | POA: Diagnosis not present

## 2015-10-11 DIAGNOSIS — Z466 Encounter for fitting and adjustment of urinary device: Secondary | ICD-10-CM | POA: Diagnosis not present

## 2015-10-25 DIAGNOSIS — Z4889 Encounter for other specified surgical aftercare: Secondary | ICD-10-CM | POA: Diagnosis not present

## 2015-10-25 DIAGNOSIS — N185 Chronic kidney disease, stage 5: Secondary | ICD-10-CM | POA: Diagnosis not present

## 2015-10-26 ENCOUNTER — Telehealth: Payer: Self-pay | Admitting: Hematology

## 2015-10-26 NOTE — Telephone Encounter (Signed)
Faxed pt office notes to [facility] [phone]

## 2015-10-29 ENCOUNTER — Telehealth: Payer: Self-pay | Admitting: Hematology

## 2015-10-29 NOTE — Telephone Encounter (Signed)
FAXED PT [place]

## 2015-10-30 DIAGNOSIS — N184 Chronic kidney disease, stage 4 (severe): Secondary | ICD-10-CM | POA: Diagnosis not present

## 2015-10-31 DIAGNOSIS — N184 Chronic kidney disease, stage 4 (severe): Secondary | ICD-10-CM | POA: Diagnosis not present

## 2015-10-31 DIAGNOSIS — R609 Edema, unspecified: Secondary | ICD-10-CM | POA: Diagnosis not present

## 2015-10-31 DIAGNOSIS — Z9221 Personal history of antineoplastic chemotherapy: Secondary | ICD-10-CM | POA: Diagnosis not present

## 2015-10-31 DIAGNOSIS — N2581 Secondary hyperparathyroidism of renal origin: Secondary | ICD-10-CM | POA: Diagnosis not present

## 2015-10-31 DIAGNOSIS — Z85038 Personal history of other malignant neoplasm of large intestine: Secondary | ICD-10-CM | POA: Diagnosis not present

## 2015-10-31 DIAGNOSIS — Z87891 Personal history of nicotine dependence: Secondary | ICD-10-CM | POA: Diagnosis not present

## 2015-10-31 DIAGNOSIS — Z923 Personal history of irradiation: Secondary | ICD-10-CM | POA: Diagnosis not present

## 2015-10-31 DIAGNOSIS — I129 Hypertensive chronic kidney disease with stage 1 through stage 4 chronic kidney disease, or unspecified chronic kidney disease: Secondary | ICD-10-CM | POA: Diagnosis not present

## 2015-10-31 DIAGNOSIS — R809 Proteinuria, unspecified: Secondary | ICD-10-CM | POA: Diagnosis not present

## 2015-10-31 DIAGNOSIS — D631 Anemia in chronic kidney disease: Secondary | ICD-10-CM | POA: Diagnosis not present

## 2015-10-31 DIAGNOSIS — E872 Acidosis: Secondary | ICD-10-CM | POA: Diagnosis not present

## 2015-10-31 DIAGNOSIS — Z936 Other artificial openings of urinary tract status: Secondary | ICD-10-CM | POA: Diagnosis not present

## 2015-10-31 DIAGNOSIS — Z79899 Other long term (current) drug therapy: Secondary | ICD-10-CM | POA: Diagnosis not present

## 2015-11-12 ENCOUNTER — Ambulatory Visit: Payer: Medicare Other | Admitting: Hematology

## 2015-11-12 ENCOUNTER — Other Ambulatory Visit: Payer: Medicare Other

## 2015-11-12 DIAGNOSIS — N10 Acute tubulo-interstitial nephritis: Secondary | ICD-10-CM | POA: Diagnosis not present

## 2015-11-12 DIAGNOSIS — E039 Hypothyroidism, unspecified: Secondary | ICD-10-CM | POA: Diagnosis not present

## 2015-11-12 DIAGNOSIS — Z87891 Personal history of nicotine dependence: Secondary | ICD-10-CM | POA: Diagnosis not present

## 2015-11-12 DIAGNOSIS — Z4901 Encounter for fitting and adjustment of extracorporeal dialysis catheter: Secondary | ICD-10-CM | POA: Diagnosis not present

## 2015-11-12 DIAGNOSIS — E872 Acidosis: Secondary | ICD-10-CM | POA: Diagnosis not present

## 2015-11-12 DIAGNOSIS — N185 Chronic kidney disease, stage 5: Secondary | ICD-10-CM | POA: Diagnosis not present

## 2015-11-12 DIAGNOSIS — Z992 Dependence on renal dialysis: Secondary | ICD-10-CM | POA: Diagnosis not present

## 2015-11-12 DIAGNOSIS — D631 Anemia in chronic kidney disease: Secondary | ICD-10-CM | POA: Diagnosis not present

## 2015-11-12 DIAGNOSIS — N186 End stage renal disease: Secondary | ICD-10-CM | POA: Diagnosis not present

## 2015-11-20 DIAGNOSIS — N135 Crossing vessel and stricture of ureter without hydronephrosis: Secondary | ICD-10-CM | POA: Diagnosis not present

## 2015-11-29 DIAGNOSIS — N184 Chronic kidney disease, stage 4 (severe): Secondary | ICD-10-CM | POA: Diagnosis not present

## 2015-12-06 DIAGNOSIS — C189 Malignant neoplasm of colon, unspecified: Secondary | ICD-10-CM | POA: Diagnosis not present

## 2015-12-07 DIAGNOSIS — N185 Chronic kidney disease, stage 5: Secondary | ICD-10-CM | POA: Diagnosis not present

## 2015-12-07 DIAGNOSIS — Z8500 Personal history of malignant neoplasm of unspecified digestive organ: Secondary | ICD-10-CM | POA: Diagnosis not present

## 2015-12-07 DIAGNOSIS — E46 Unspecified protein-calorie malnutrition: Secondary | ICD-10-CM | POA: Diagnosis not present

## 2015-12-07 DIAGNOSIS — D649 Anemia, unspecified: Secondary | ICD-10-CM | POA: Diagnosis not present

## 2015-12-11 DIAGNOSIS — N186 End stage renal disease: Secondary | ICD-10-CM | POA: Diagnosis not present

## 2015-12-11 DIAGNOSIS — N189 Chronic kidney disease, unspecified: Secondary | ICD-10-CM | POA: Diagnosis not present

## 2015-12-19 DIAGNOSIS — N131 Hydronephrosis with ureteral stricture, not elsewhere classified: Secondary | ICD-10-CM | POA: Diagnosis not present

## 2015-12-19 DIAGNOSIS — Z85038 Personal history of other malignant neoplasm of large intestine: Secondary | ICD-10-CM | POA: Diagnosis not present

## 2015-12-19 DIAGNOSIS — Z9221 Personal history of antineoplastic chemotherapy: Secondary | ICD-10-CM | POA: Diagnosis not present

## 2015-12-19 DIAGNOSIS — N111 Chronic obstructive pyelonephritis: Secondary | ICD-10-CM | POA: Diagnosis not present

## 2015-12-19 DIAGNOSIS — N186 End stage renal disease: Secondary | ICD-10-CM | POA: Diagnosis not present

## 2015-12-19 DIAGNOSIS — Z466 Encounter for fitting and adjustment of urinary device: Secondary | ICD-10-CM | POA: Diagnosis not present

## 2015-12-19 DIAGNOSIS — E039 Hypothyroidism, unspecified: Secondary | ICD-10-CM | POA: Diagnosis not present

## 2015-12-19 DIAGNOSIS — D631 Anemia in chronic kidney disease: Secondary | ICD-10-CM | POA: Diagnosis not present

## 2015-12-19 DIAGNOSIS — N360 Urethral fistula: Secondary | ICD-10-CM | POA: Diagnosis not present

## 2015-12-20 DIAGNOSIS — Z85038 Personal history of other malignant neoplasm of large intestine: Secondary | ICD-10-CM | POA: Diagnosis not present

## 2015-12-20 DIAGNOSIS — N131 Hydronephrosis with ureteral stricture, not elsewhere classified: Secondary | ICD-10-CM | POA: Diagnosis not present

## 2015-12-20 DIAGNOSIS — N1330 Unspecified hydronephrosis: Secondary | ICD-10-CM | POA: Diagnosis not present

## 2016-01-17 DIAGNOSIS — Z8509 Personal history of malignant neoplasm of other digestive organs: Secondary | ICD-10-CM | POA: Diagnosis not present

## 2016-01-17 DIAGNOSIS — Z9049 Acquired absence of other specified parts of digestive tract: Secondary | ICD-10-CM | POA: Diagnosis not present

## 2016-01-17 DIAGNOSIS — Z08 Encounter for follow-up examination after completed treatment for malignant neoplasm: Secondary | ICD-10-CM | POA: Diagnosis not present

## 2016-01-17 DIAGNOSIS — Z933 Colostomy status: Secondary | ICD-10-CM | POA: Diagnosis not present

## 2016-01-17 DIAGNOSIS — E039 Hypothyroidism, unspecified: Secondary | ICD-10-CM | POA: Diagnosis not present

## 2016-01-17 DIAGNOSIS — N1330 Unspecified hydronephrosis: Secondary | ICD-10-CM | POA: Diagnosis not present

## 2016-01-17 DIAGNOSIS — Z9221 Personal history of antineoplastic chemotherapy: Secondary | ICD-10-CM | POA: Diagnosis not present

## 2016-01-17 DIAGNOSIS — N189 Chronic kidney disease, unspecified: Secondary | ICD-10-CM | POA: Diagnosis not present

## 2016-01-17 DIAGNOSIS — C19 Malignant neoplasm of rectosigmoid junction: Secondary | ICD-10-CM | POA: Diagnosis not present

## 2016-01-23 DIAGNOSIS — N135 Crossing vessel and stricture of ureter without hydronephrosis: Secondary | ICD-10-CM | POA: Diagnosis not present

## 2016-01-23 DIAGNOSIS — R319 Hematuria, unspecified: Secondary | ICD-10-CM | POA: Diagnosis not present

## 2016-01-25 ENCOUNTER — Telehealth: Payer: Self-pay | Admitting: Hematology

## 2016-01-25 NOTE — Telephone Encounter (Signed)
FAXED RECORDS TO [facility] RELEASE ID [id number]

## 2016-02-05 DIAGNOSIS — N390 Urinary tract infection, site not specified: Secondary | ICD-10-CM | POA: Diagnosis not present

## 2016-02-07 DIAGNOSIS — A419 Sepsis, unspecified organism: Secondary | ICD-10-CM | POA: Diagnosis not present

## 2016-02-07 DIAGNOSIS — N186 End stage renal disease: Secondary | ICD-10-CM | POA: Diagnosis not present

## 2016-02-07 DIAGNOSIS — N99528 Other complication of other external stoma of urinary tract: Secondary | ICD-10-CM | POA: Diagnosis not present

## 2016-02-07 DIAGNOSIS — T83012A Breakdown (mechanical) of nephrostomy catheter, initial encounter: Secondary | ICD-10-CM | POA: Diagnosis not present

## 2016-02-07 DIAGNOSIS — N179 Acute kidney failure, unspecified: Secondary | ICD-10-CM | POA: Diagnosis not present

## 2016-02-07 DIAGNOSIS — N390 Urinary tract infection, site not specified: Secondary | ICD-10-CM | POA: Diagnosis not present

## 2016-02-07 DIAGNOSIS — E872 Acidosis: Secondary | ICD-10-CM | POA: Diagnosis not present

## 2016-02-07 DIAGNOSIS — R7881 Bacteremia: Secondary | ICD-10-CM | POA: Diagnosis not present

## 2016-02-07 DIAGNOSIS — I120 Hypertensive chronic kidney disease with stage 5 chronic kidney disease or end stage renal disease: Secondary | ICD-10-CM | POA: Diagnosis not present

## 2016-02-08 DIAGNOSIS — Z466 Encounter for fitting and adjustment of urinary device: Secondary | ICD-10-CM | POA: Diagnosis not present

## 2016-02-08 DIAGNOSIS — Z85048 Personal history of other malignant neoplasm of rectum, rectosigmoid junction, and anus: Secondary | ICD-10-CM | POA: Diagnosis not present

## 2016-02-08 DIAGNOSIS — R7881 Bacteremia: Secondary | ICD-10-CM | POA: Diagnosis not present

## 2016-02-08 DIAGNOSIS — Z436 Encounter for attention to other artificial openings of urinary tract: Secondary | ICD-10-CM | POA: Diagnosis not present

## 2016-02-08 DIAGNOSIS — T83518A Infection and inflammatory reaction due to other urinary catheter, initial encounter: Secondary | ICD-10-CM | POA: Diagnosis present

## 2016-02-08 DIAGNOSIS — N189 Chronic kidney disease, unspecified: Secondary | ICD-10-CM | POA: Diagnosis not present

## 2016-02-08 DIAGNOSIS — E039 Hypothyroidism, unspecified: Secondary | ICD-10-CM | POA: Diagnosis present

## 2016-02-08 DIAGNOSIS — T83092A Other mechanical complication of nephrostomy catheter, initial encounter: Secondary | ICD-10-CM | POA: Diagnosis not present

## 2016-02-08 DIAGNOSIS — N179 Acute kidney failure, unspecified: Secondary | ICD-10-CM | POA: Diagnosis not present

## 2016-02-08 DIAGNOSIS — B955 Unspecified streptococcus as the cause of diseases classified elsewhere: Secondary | ICD-10-CM | POA: Diagnosis present

## 2016-02-08 DIAGNOSIS — Z923 Personal history of irradiation: Secondary | ICD-10-CM | POA: Diagnosis not present

## 2016-02-08 DIAGNOSIS — T83511A Infection and inflammatory reaction due to indwelling urethral catheter, initial encounter: Secondary | ICD-10-CM | POA: Diagnosis not present

## 2016-02-08 DIAGNOSIS — R6520 Severe sepsis without septic shock: Secondary | ICD-10-CM | POA: Diagnosis not present

## 2016-02-08 DIAGNOSIS — E872 Acidosis: Secondary | ICD-10-CM | POA: Diagnosis not present

## 2016-02-08 DIAGNOSIS — Z9221 Personal history of antineoplastic chemotherapy: Secondary | ICD-10-CM | POA: Diagnosis not present

## 2016-02-08 DIAGNOSIS — A419 Sepsis, unspecified organism: Secondary | ICD-10-CM | POA: Diagnosis not present

## 2016-02-08 DIAGNOSIS — B999 Unspecified infectious disease: Secondary | ICD-10-CM | POA: Diagnosis not present

## 2016-02-08 DIAGNOSIS — D509 Iron deficiency anemia, unspecified: Secondary | ICD-10-CM | POA: Diagnosis not present

## 2016-02-08 DIAGNOSIS — N183 Chronic kidney disease, stage 3 (moderate): Secondary | ICD-10-CM | POA: Diagnosis not present

## 2016-02-08 DIAGNOSIS — D649 Anemia, unspecified: Secondary | ICD-10-CM | POA: Diagnosis not present

## 2016-02-08 DIAGNOSIS — N390 Urinary tract infection, site not specified: Secondary | ICD-10-CM | POA: Diagnosis not present

## 2016-02-08 DIAGNOSIS — N186 End stage renal disease: Secondary | ICD-10-CM | POA: Diagnosis present

## 2016-02-08 DIAGNOSIS — T83098A Other mechanical complication of other indwelling urethral catheter, initial encounter: Secondary | ICD-10-CM | POA: Diagnosis not present

## 2016-02-08 DIAGNOSIS — N99522 Malfunction of other external stoma of urinary tract: Secondary | ICD-10-CM | POA: Diagnosis not present

## 2016-02-08 DIAGNOSIS — N184 Chronic kidney disease, stage 4 (severe): Secondary | ICD-10-CM | POA: Diagnosis not present

## 2016-02-08 DIAGNOSIS — N12 Tubulo-interstitial nephritis, not specified as acute or chronic: Secondary | ICD-10-CM | POA: Diagnosis not present

## 2016-02-08 DIAGNOSIS — T83012A Breakdown (mechanical) of nephrostomy catheter, initial encounter: Secondary | ICD-10-CM | POA: Diagnosis present

## 2016-02-08 DIAGNOSIS — N99528 Other complication of other external stoma of urinary tract: Secondary | ICD-10-CM | POA: Diagnosis not present

## 2016-02-08 DIAGNOSIS — Z85038 Personal history of other malignant neoplasm of large intestine: Secondary | ICD-10-CM | POA: Diagnosis not present

## 2016-02-08 DIAGNOSIS — Z2821 Immunization not carried out because of patient refusal: Secondary | ICD-10-CM | POA: Diagnosis not present

## 2016-02-08 DIAGNOSIS — I120 Hypertensive chronic kidney disease with stage 5 chronic kidney disease or end stage renal disease: Secondary | ICD-10-CM | POA: Diagnosis present

## 2016-02-08 DIAGNOSIS — C61 Malignant neoplasm of prostate: Secondary | ICD-10-CM | POA: Diagnosis present

## 2016-02-08 DIAGNOSIS — B961 Klebsiella pneumoniae [K. pneumoniae] as the cause of diseases classified elsewhere: Secondary | ICD-10-CM | POA: Diagnosis present

## 2016-02-08 DIAGNOSIS — N136 Pyonephrosis: Secondary | ICD-10-CM | POA: Diagnosis present

## 2016-02-08 DIAGNOSIS — D631 Anemia in chronic kidney disease: Secondary | ICD-10-CM | POA: Diagnosis present

## 2016-02-15 IMAGING — XA IR EXCHANGE NEPHROSTOMY RIGHT
1 series · 5 of 5 positions shown · non-contrast
Comparison: none

:
CLINICAL DATA: Colon carcinoma, bilateral ureteral strictures,
long-term indwelling nephrostomy catheters. No problems since
previous exchange. Patient is planning on ultimately moving to
[place].

EXAM:
BILATERAL PERCUTANEOUS NEPHROSTOMY CATHETER EXCHANGE UNDER
FLUOROSCOPY
FLUOROSCOPY TIME:  0.9 minutes, 65  uXymU DAP
TECHNIQUE: The procedure, risks (including but not limited to bleeding,
infection, organ damage ), benefits, and alternatives were explained
to the patient. Questions regarding the procedure were encouraged
and answered. The patient understands and consents to the procedure.
The nephrostomy tubes and surrounding skin were prepped with
Betadine, draped in usual sterile fashion.

[Series 300: ir nephrostomy exchange right · 5 of 5 slices shown]
[im 1/5]
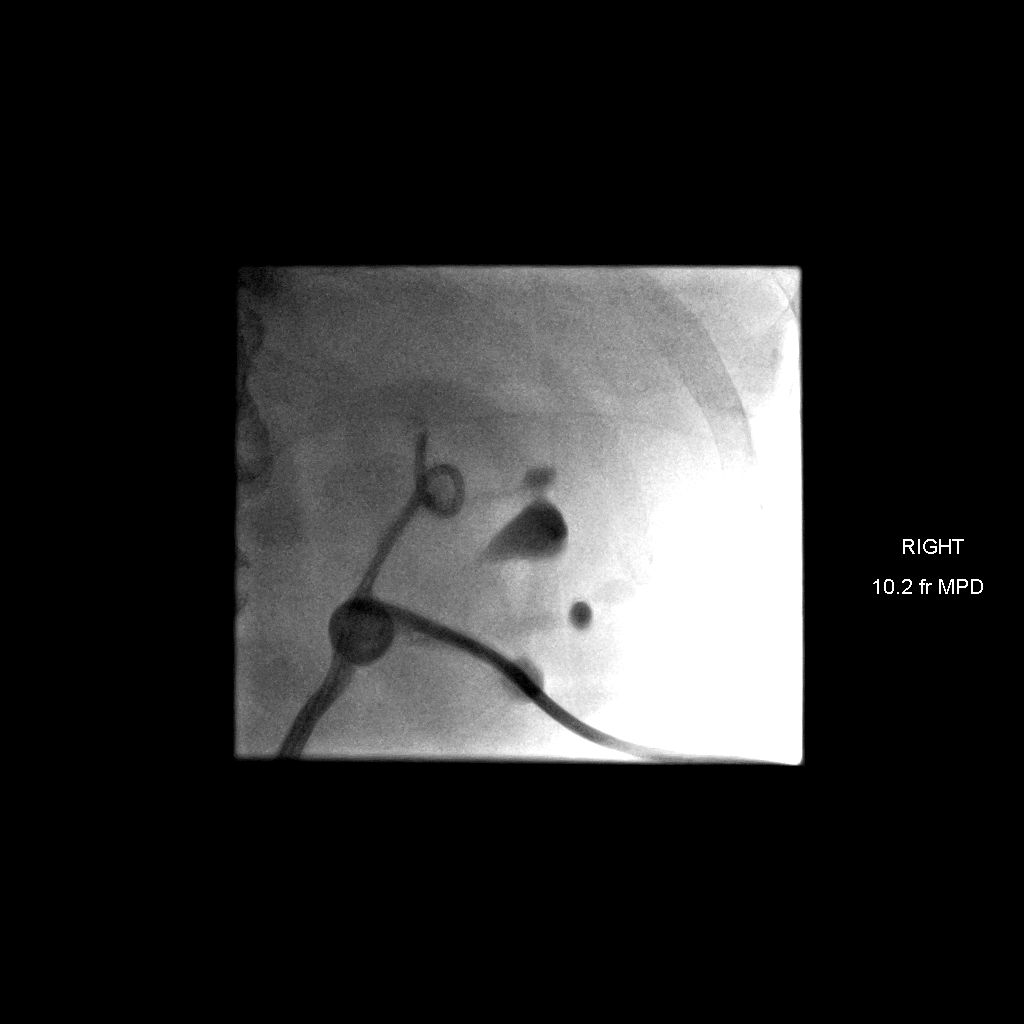
[im 2/5]
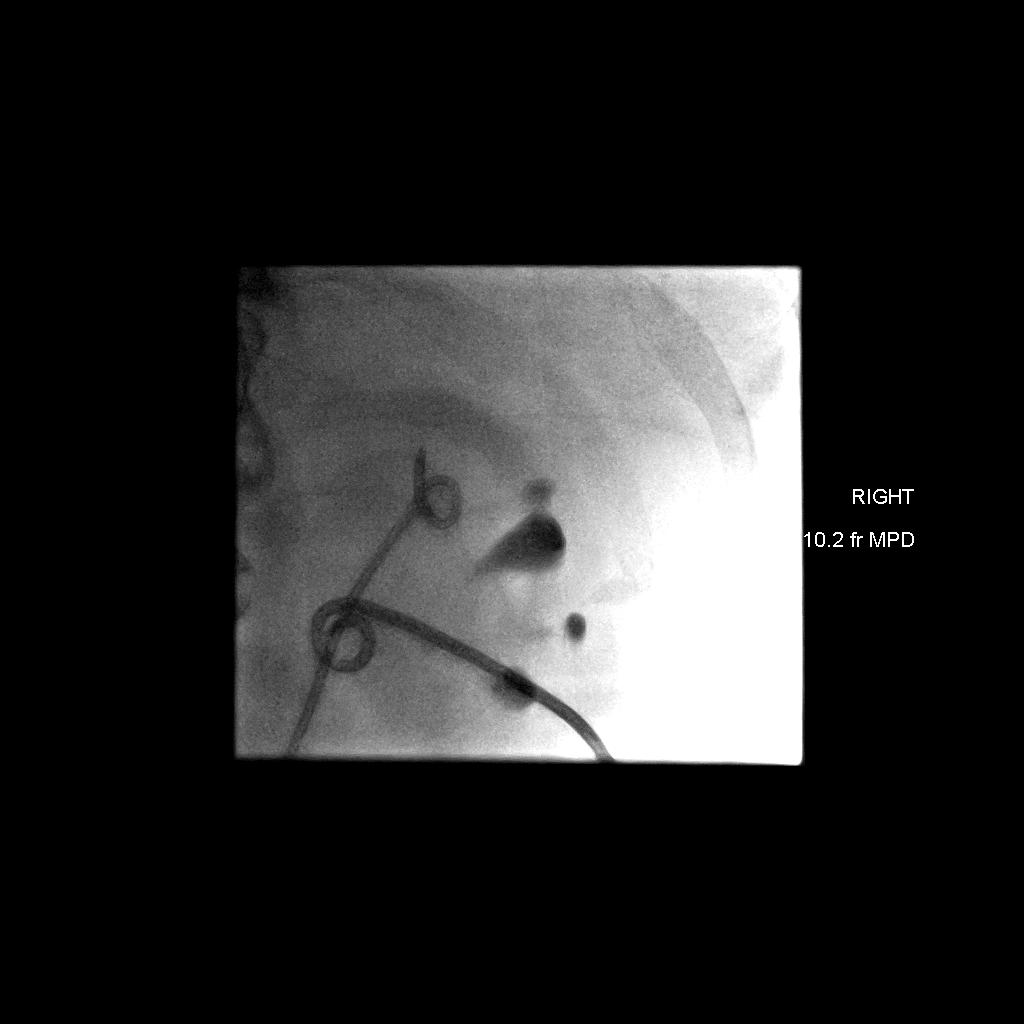
[im 3/5]
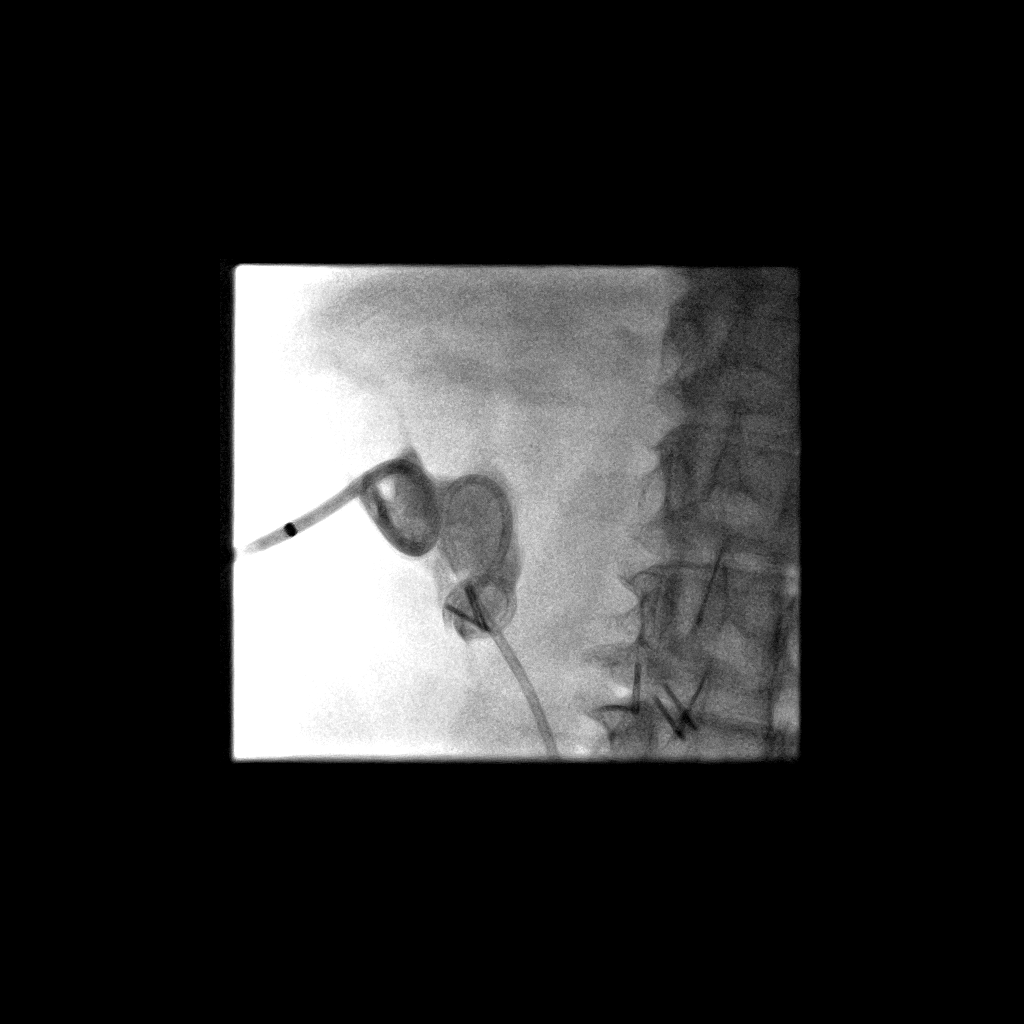
[im 4/5]
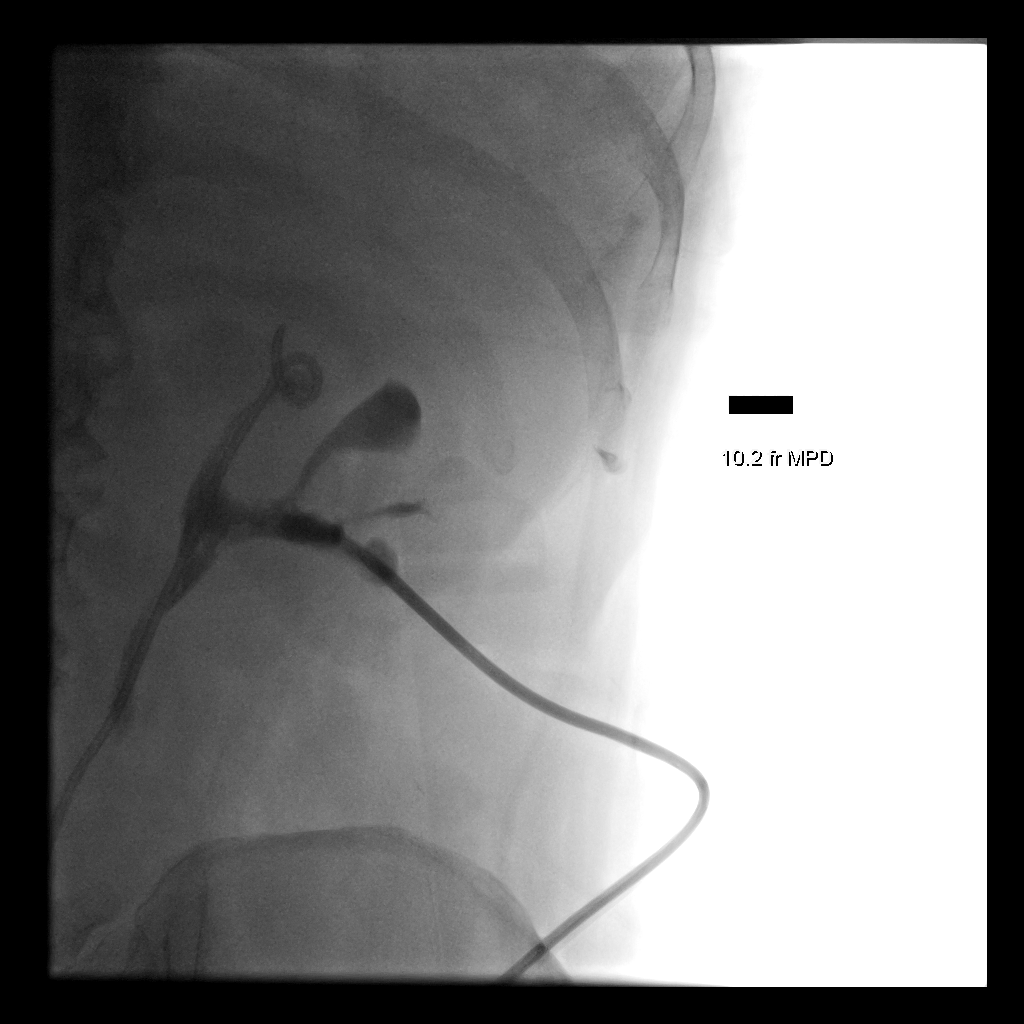
[im 5/5]
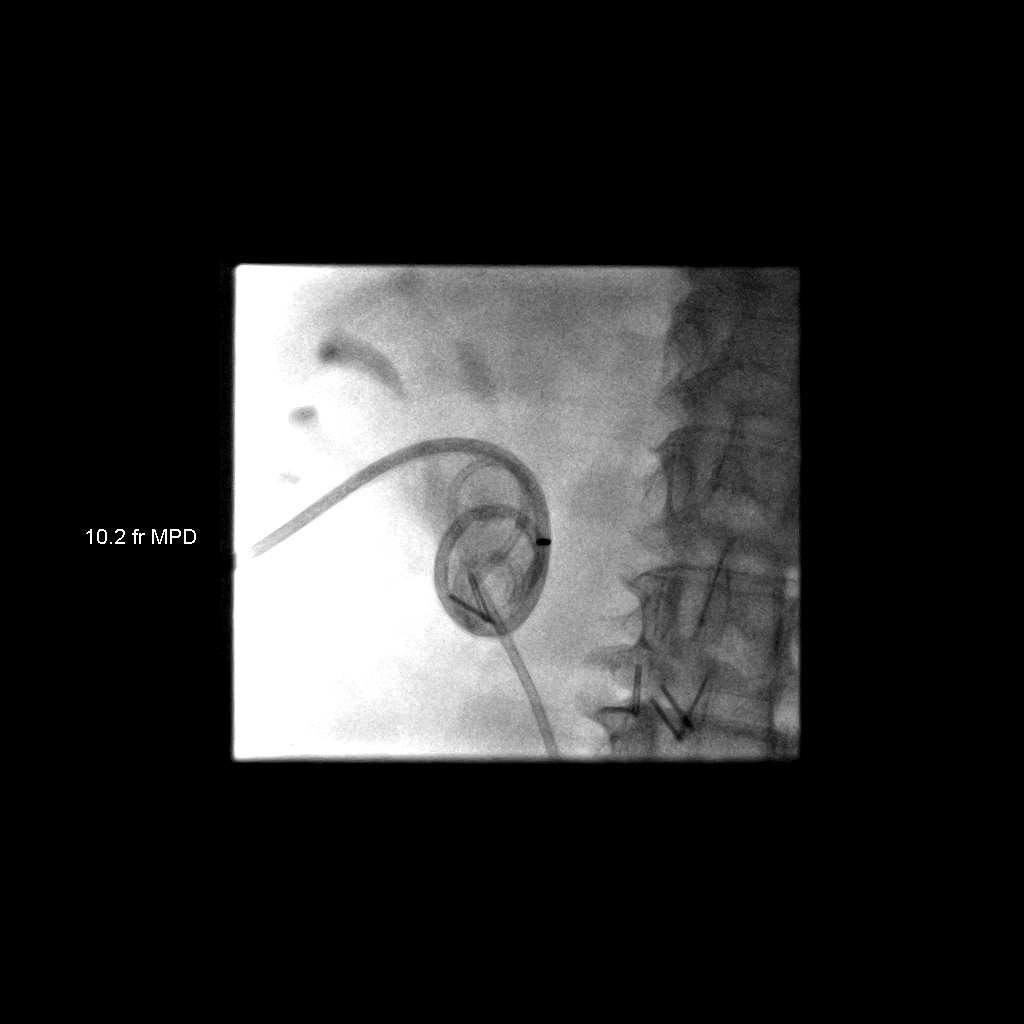

[5 of 5 positions shown; findings below may reference images not displayed]

A small amount of contrast was injected through the right
nephrostomy catheter to opacify the renal collecting system. The
catheter was cut and exchanged over a 0.035" angiographic wire for a
new 10-Nisith Boucht pigtail catheter, formed centrally
within the collecting system under fluoroscopy. Contrast injection
confirms appropriate positioning.

In a similar fashion, the left nephrostomy catheter was injected,
cut, and exchanged for a new 10-Nisith Boucht pigtail
catheter, formed centrally within the left renal collecting system.
Injection confirms appropriate positioning and patency. Both
catheters were secured externally with  Statlock devices . The
patient tolerated the procedure well.

COMPLICATIONS:
none
IMPRESSION: 1. Technically successful exchange of bilateral nephrostomy
catheters under fluoroscopy

## 2016-02-16 DIAGNOSIS — N189 Chronic kidney disease, unspecified: Secondary | ICD-10-CM | POA: Diagnosis not present

## 2016-02-16 DIAGNOSIS — T83511A Infection and inflammatory reaction due to indwelling urethral catheter, initial encounter: Secondary | ICD-10-CM | POA: Diagnosis not present

## 2016-02-16 DIAGNOSIS — K604 Rectal fistula: Secondary | ICD-10-CM | POA: Diagnosis not present

## 2016-02-16 DIAGNOSIS — T83092D Other mechanical complication of nephrostomy catheter, subsequent encounter: Secondary | ICD-10-CM | POA: Diagnosis not present

## 2016-02-16 DIAGNOSIS — B961 Klebsiella pneumoniae [K. pneumoniae] as the cause of diseases classified elsewhere: Secondary | ICD-10-CM | POA: Diagnosis not present

## 2016-02-16 DIAGNOSIS — N10 Acute tubulo-interstitial nephritis: Secondary | ICD-10-CM | POA: Diagnosis not present

## 2016-02-19 DIAGNOSIS — B961 Klebsiella pneumoniae [K. pneumoniae] as the cause of diseases classified elsewhere: Secondary | ICD-10-CM | POA: Diagnosis not present

## 2016-02-19 DIAGNOSIS — T83511A Infection and inflammatory reaction due to indwelling urethral catheter, initial encounter: Secondary | ICD-10-CM | POA: Diagnosis not present

## 2016-02-19 DIAGNOSIS — T83092D Other mechanical complication of nephrostomy catheter, subsequent encounter: Secondary | ICD-10-CM | POA: Diagnosis not present

## 2016-02-19 DIAGNOSIS — N10 Acute tubulo-interstitial nephritis: Secondary | ICD-10-CM | POA: Diagnosis not present

## 2016-02-19 DIAGNOSIS — K604 Rectal fistula: Secondary | ICD-10-CM | POA: Diagnosis not present

## 2016-02-19 DIAGNOSIS — N189 Chronic kidney disease, unspecified: Secondary | ICD-10-CM | POA: Diagnosis not present

## 2016-02-20 DIAGNOSIS — R600 Localized edema: Secondary | ICD-10-CM | POA: Diagnosis not present

## 2016-02-20 DIAGNOSIS — E872 Acidosis: Secondary | ICD-10-CM | POA: Diagnosis not present

## 2016-02-20 DIAGNOSIS — R809 Proteinuria, unspecified: Secondary | ICD-10-CM | POA: Diagnosis not present

## 2016-02-20 DIAGNOSIS — N2581 Secondary hyperparathyroidism of renal origin: Secondary | ICD-10-CM | POA: Diagnosis not present

## 2016-02-20 DIAGNOSIS — I120 Hypertensive chronic kidney disease with stage 5 chronic kidney disease or end stage renal disease: Secondary | ICD-10-CM | POA: Diagnosis not present

## 2016-02-20 DIAGNOSIS — N185 Chronic kidney disease, stage 5: Secondary | ICD-10-CM | POA: Diagnosis not present

## 2016-02-20 DIAGNOSIS — D631 Anemia in chronic kidney disease: Secondary | ICD-10-CM | POA: Diagnosis not present

## 2016-02-22 DIAGNOSIS — B961 Klebsiella pneumoniae [K. pneumoniae] as the cause of diseases classified elsewhere: Secondary | ICD-10-CM | POA: Diagnosis not present

## 2016-02-22 DIAGNOSIS — K604 Rectal fistula: Secondary | ICD-10-CM | POA: Diagnosis not present

## 2016-02-22 DIAGNOSIS — T83511A Infection and inflammatory reaction due to indwelling urethral catheter, initial encounter: Secondary | ICD-10-CM | POA: Diagnosis not present

## 2016-02-22 DIAGNOSIS — T83092D Other mechanical complication of nephrostomy catheter, subsequent encounter: Secondary | ICD-10-CM | POA: Diagnosis not present

## 2016-02-22 DIAGNOSIS — N10 Acute tubulo-interstitial nephritis: Secondary | ICD-10-CM | POA: Diagnosis not present

## 2016-02-22 DIAGNOSIS — N189 Chronic kidney disease, unspecified: Secondary | ICD-10-CM | POA: Diagnosis not present

## 2016-02-26 DIAGNOSIS — K604 Rectal fistula: Secondary | ICD-10-CM | POA: Diagnosis not present

## 2016-02-26 DIAGNOSIS — B961 Klebsiella pneumoniae [K. pneumoniae] as the cause of diseases classified elsewhere: Secondary | ICD-10-CM | POA: Diagnosis not present

## 2016-02-26 DIAGNOSIS — T83092D Other mechanical complication of nephrostomy catheter, subsequent encounter: Secondary | ICD-10-CM | POA: Diagnosis not present

## 2016-02-26 DIAGNOSIS — T83511A Infection and inflammatory reaction due to indwelling urethral catheter, initial encounter: Secondary | ICD-10-CM | POA: Diagnosis not present

## 2016-02-26 DIAGNOSIS — N189 Chronic kidney disease, unspecified: Secondary | ICD-10-CM | POA: Diagnosis not present

## 2016-02-26 DIAGNOSIS — N10 Acute tubulo-interstitial nephritis: Secondary | ICD-10-CM | POA: Diagnosis not present

## 2016-02-27 DIAGNOSIS — Z452 Encounter for adjustment and management of vascular access device: Secondary | ICD-10-CM | POA: Diagnosis not present

## 2016-03-04 DIAGNOSIS — N189 Chronic kidney disease, unspecified: Secondary | ICD-10-CM | POA: Diagnosis not present

## 2016-03-04 DIAGNOSIS — N10 Acute tubulo-interstitial nephritis: Secondary | ICD-10-CM | POA: Diagnosis not present

## 2016-03-04 DIAGNOSIS — T83511A Infection and inflammatory reaction due to indwelling urethral catheter, initial encounter: Secondary | ICD-10-CM | POA: Diagnosis not present

## 2016-03-04 DIAGNOSIS — T83092D Other mechanical complication of nephrostomy catheter, subsequent encounter: Secondary | ICD-10-CM | POA: Diagnosis not present

## 2016-03-04 DIAGNOSIS — B961 Klebsiella pneumoniae [K. pneumoniae] as the cause of diseases classified elsewhere: Secondary | ICD-10-CM | POA: Diagnosis not present

## 2016-03-04 DIAGNOSIS — K604 Rectal fistula: Secondary | ICD-10-CM | POA: Diagnosis not present

## 2016-03-07 DIAGNOSIS — D631 Anemia in chronic kidney disease: Secondary | ICD-10-CM | POA: Diagnosis not present

## 2016-03-07 DIAGNOSIS — Z933 Colostomy status: Secondary | ICD-10-CM | POA: Diagnosis not present

## 2016-03-07 DIAGNOSIS — Z936 Other artificial openings of urinary tract status: Secondary | ICD-10-CM | POA: Diagnosis not present

## 2016-03-07 DIAGNOSIS — Z01818 Encounter for other preprocedural examination: Secondary | ICD-10-CM | POA: Diagnosis not present

## 2016-03-07 DIAGNOSIS — N185 Chronic kidney disease, stage 5: Secondary | ICD-10-CM | POA: Diagnosis not present

## 2016-03-13 DIAGNOSIS — Z936 Other artificial openings of urinary tract status: Secondary | ICD-10-CM | POA: Diagnosis not present

## 2016-03-13 DIAGNOSIS — Z960 Presence of urogenital implants: Secondary | ICD-10-CM | POA: Diagnosis not present

## 2016-03-13 DIAGNOSIS — K604 Rectal fistula: Secondary | ICD-10-CM | POA: Diagnosis not present

## 2016-03-13 DIAGNOSIS — N135 Crossing vessel and stricture of ureter without hydronephrosis: Secondary | ICD-10-CM | POA: Diagnosis not present

## 2016-04-10 DIAGNOSIS — Z466 Encounter for fitting and adjustment of urinary device: Secondary | ICD-10-CM | POA: Diagnosis not present

## 2016-04-14 DIAGNOSIS — Z436 Encounter for attention to other artificial openings of urinary tract: Secondary | ICD-10-CM | POA: Diagnosis not present

## 2016-04-21 DIAGNOSIS — N185 Chronic kidney disease, stage 5: Secondary | ICD-10-CM | POA: Diagnosis not present

## 2016-04-23 DIAGNOSIS — E872 Acidosis: Secondary | ICD-10-CM | POA: Diagnosis not present

## 2016-04-23 DIAGNOSIS — N185 Chronic kidney disease, stage 5: Secondary | ICD-10-CM | POA: Diagnosis not present

## 2016-04-23 DIAGNOSIS — D638 Anemia in other chronic diseases classified elsewhere: Secondary | ICD-10-CM | POA: Diagnosis not present

## 2016-04-23 DIAGNOSIS — D509 Iron deficiency anemia, unspecified: Secondary | ICD-10-CM | POA: Diagnosis not present

## 2016-04-23 DIAGNOSIS — D631 Anemia in chronic kidney disease: Secondary | ICD-10-CM | POA: Diagnosis not present

## 2016-04-23 DIAGNOSIS — N2581 Secondary hyperparathyroidism of renal origin: Secondary | ICD-10-CM | POA: Diagnosis not present

## 2016-04-23 DIAGNOSIS — I120 Hypertensive chronic kidney disease with stage 5 chronic kidney disease or end stage renal disease: Secondary | ICD-10-CM | POA: Diagnosis not present

## 2016-05-08 DIAGNOSIS — R339 Retention of urine, unspecified: Secondary | ICD-10-CM | POA: Diagnosis not present

## 2016-05-14 DIAGNOSIS — Z960 Presence of urogenital implants: Secondary | ICD-10-CM | POA: Diagnosis not present

## 2016-05-14 DIAGNOSIS — N2882 Megaloureter: Secondary | ICD-10-CM | POA: Diagnosis not present

## 2016-05-14 DIAGNOSIS — N186 End stage renal disease: Secondary | ICD-10-CM | POA: Diagnosis not present

## 2016-05-14 DIAGNOSIS — Z7682 Awaiting organ transplant status: Secondary | ICD-10-CM | POA: Diagnosis not present

## 2016-05-14 DIAGNOSIS — Z01810 Encounter for preprocedural cardiovascular examination: Secondary | ICD-10-CM | POA: Diagnosis not present

## 2016-05-14 DIAGNOSIS — N289 Disorder of kidney and ureter, unspecified: Secondary | ICD-10-CM | POA: Diagnosis not present

## 2016-05-14 DIAGNOSIS — K0490 Unspecified diseases of pulp and periapical tissues: Secondary | ICD-10-CM | POA: Diagnosis not present

## 2016-05-14 DIAGNOSIS — Z01812 Encounter for preprocedural laboratory examination: Secondary | ICD-10-CM | POA: Diagnosis not present

## 2016-05-14 DIAGNOSIS — Z0389 Encounter for observation for other suspected diseases and conditions ruled out: Secondary | ICD-10-CM | POA: Diagnosis not present

## 2016-05-16 DIAGNOSIS — Z01818 Encounter for other preprocedural examination: Secondary | ICD-10-CM | POA: Diagnosis not present

## 2016-05-16 DIAGNOSIS — Z7682 Awaiting organ transplant status: Secondary | ICD-10-CM | POA: Diagnosis not present

## 2016-06-04 DIAGNOSIS — N360 Urethral fistula: Secondary | ICD-10-CM | POA: Diagnosis not present

## 2016-06-04 DIAGNOSIS — K219 Gastro-esophageal reflux disease without esophagitis: Secondary | ICD-10-CM | POA: Diagnosis not present

## 2016-06-04 DIAGNOSIS — Z960 Presence of urogenital implants: Secondary | ICD-10-CM | POA: Diagnosis not present

## 2016-06-04 DIAGNOSIS — K9189 Other postprocedural complications and disorders of digestive system: Secondary | ICD-10-CM | POA: Diagnosis not present

## 2016-06-04 DIAGNOSIS — Z87891 Personal history of nicotine dependence: Secondary | ICD-10-CM | POA: Diagnosis not present

## 2016-06-04 DIAGNOSIS — G4733 Obstructive sleep apnea (adult) (pediatric): Secondary | ICD-10-CM | POA: Diagnosis not present

## 2016-06-04 DIAGNOSIS — N10 Acute tubulo-interstitial nephritis: Secondary | ICD-10-CM | POA: Diagnosis not present

## 2016-06-04 DIAGNOSIS — K604 Rectal fistula: Secondary | ICD-10-CM | POA: Diagnosis not present

## 2016-06-04 DIAGNOSIS — Z85038 Personal history of other malignant neoplasm of large intestine: Secondary | ICD-10-CM | POA: Diagnosis not present

## 2016-06-04 DIAGNOSIS — D631 Anemia in chronic kidney disease: Secondary | ICD-10-CM | POA: Diagnosis not present

## 2016-06-04 DIAGNOSIS — Z466 Encounter for fitting and adjustment of urinary device: Secondary | ICD-10-CM | POA: Diagnosis not present

## 2016-06-04 DIAGNOSIS — N135 Crossing vessel and stricture of ureter without hydronephrosis: Secondary | ICD-10-CM | POA: Diagnosis not present

## 2016-06-04 DIAGNOSIS — E039 Hypothyroidism, unspecified: Secondary | ICD-10-CM | POA: Diagnosis not present

## 2016-06-04 DIAGNOSIS — N4289 Other specified disorders of prostate: Secondary | ICD-10-CM | POA: Diagnosis not present

## 2016-06-04 DIAGNOSIS — Z9049 Acquired absence of other specified parts of digestive tract: Secondary | ICD-10-CM | POA: Diagnosis not present

## 2016-06-04 DIAGNOSIS — E872 Acidosis: Secondary | ICD-10-CM | POA: Diagnosis not present

## 2016-06-04 DIAGNOSIS — Z79899 Other long term (current) drug therapy: Secondary | ICD-10-CM | POA: Diagnosis not present

## 2016-06-04 DIAGNOSIS — N186 End stage renal disease: Secondary | ICD-10-CM | POA: Diagnosis not present

## 2016-07-03 DIAGNOSIS — C189 Malignant neoplasm of colon, unspecified: Secondary | ICD-10-CM | POA: Diagnosis not present

## 2016-07-03 DIAGNOSIS — N135 Crossing vessel and stricture of ureter without hydronephrosis: Secondary | ICD-10-CM | POA: Diagnosis not present

## 2016-07-03 DIAGNOSIS — Z8500 Personal history of malignant neoplasm of unspecified digestive organ: Secondary | ICD-10-CM | POA: Diagnosis not present

## 2016-07-03 DIAGNOSIS — D649 Anemia, unspecified: Secondary | ICD-10-CM | POA: Diagnosis not present

## 2016-07-03 DIAGNOSIS — N185 Chronic kidney disease, stage 5: Secondary | ICD-10-CM | POA: Diagnosis not present

## 2016-07-03 DIAGNOSIS — Z9049 Acquired absence of other specified parts of digestive tract: Secondary | ICD-10-CM | POA: Diagnosis not present

## 2016-07-09 DIAGNOSIS — Z9049 Acquired absence of other specified parts of digestive tract: Secondary | ICD-10-CM | POA: Diagnosis not present

## 2016-07-09 DIAGNOSIS — Z87891 Personal history of nicotine dependence: Secondary | ICD-10-CM | POA: Diagnosis not present

## 2016-07-09 DIAGNOSIS — Z9221 Personal history of antineoplastic chemotherapy: Secondary | ICD-10-CM | POA: Diagnosis not present

## 2016-07-09 DIAGNOSIS — Z992 Dependence on renal dialysis: Secondary | ICD-10-CM | POA: Diagnosis not present

## 2016-07-09 DIAGNOSIS — N10 Acute tubulo-interstitial nephritis: Secondary | ICD-10-CM | POA: Diagnosis not present

## 2016-07-09 DIAGNOSIS — D631 Anemia in chronic kidney disease: Secondary | ICD-10-CM | POA: Diagnosis not present

## 2016-07-09 DIAGNOSIS — Z436 Encounter for attention to other artificial openings of urinary tract: Secondary | ICD-10-CM | POA: Diagnosis not present

## 2016-07-09 DIAGNOSIS — N186 End stage renal disease: Secondary | ICD-10-CM | POA: Diagnosis not present

## 2016-07-09 DIAGNOSIS — E039 Hypothyroidism, unspecified: Secondary | ICD-10-CM | POA: Diagnosis not present

## 2016-07-09 DIAGNOSIS — Z923 Personal history of irradiation: Secondary | ICD-10-CM | POA: Diagnosis not present

## 2016-07-09 DIAGNOSIS — Z466 Encounter for fitting and adjustment of urinary device: Secondary | ICD-10-CM | POA: Diagnosis not present

## 2016-07-09 DIAGNOSIS — Z85038 Personal history of other malignant neoplasm of large intestine: Secondary | ICD-10-CM | POA: Diagnosis not present

## 2016-07-09 DIAGNOSIS — N135 Crossing vessel and stricture of ureter without hydronephrosis: Secondary | ICD-10-CM | POA: Diagnosis not present

## 2016-07-10 DIAGNOSIS — R339 Retention of urine, unspecified: Secondary | ICD-10-CM | POA: Diagnosis not present

## 2016-07-29 DIAGNOSIS — R7989 Other specified abnormal findings of blood chemistry: Secondary | ICD-10-CM | POA: Diagnosis not present

## 2016-07-30 DIAGNOSIS — R809 Proteinuria, unspecified: Secondary | ICD-10-CM | POA: Diagnosis not present

## 2016-07-30 DIAGNOSIS — D631 Anemia in chronic kidney disease: Secondary | ICD-10-CM | POA: Diagnosis not present

## 2016-07-30 DIAGNOSIS — N2581 Secondary hyperparathyroidism of renal origin: Secondary | ICD-10-CM | POA: Diagnosis not present

## 2016-07-30 DIAGNOSIS — Z992 Dependence on renal dialysis: Secondary | ICD-10-CM | POA: Diagnosis not present

## 2016-07-30 DIAGNOSIS — E872 Acidosis: Secondary | ICD-10-CM | POA: Diagnosis not present

## 2016-07-30 DIAGNOSIS — N185 Chronic kidney disease, stage 5: Secondary | ICD-10-CM | POA: Diagnosis not present

## 2016-07-30 DIAGNOSIS — I120 Hypertensive chronic kidney disease with stage 5 chronic kidney disease or end stage renal disease: Secondary | ICD-10-CM | POA: Diagnosis not present

## 2016-07-31 DIAGNOSIS — C189 Malignant neoplasm of colon, unspecified: Secondary | ICD-10-CM | POA: Diagnosis not present

## 2016-07-31 DIAGNOSIS — Z960 Presence of urogenital implants: Secondary | ICD-10-CM | POA: Diagnosis not present

## 2016-07-31 DIAGNOSIS — N261 Atrophy of kidney (terminal): Secondary | ICD-10-CM | POA: Diagnosis not present

## 2016-07-31 DIAGNOSIS — R601 Generalized edema: Secondary | ICD-10-CM | POA: Diagnosis not present

## 2016-07-31 DIAGNOSIS — Z85038 Personal history of other malignant neoplasm of large intestine: Secondary | ICD-10-CM | POA: Diagnosis not present

## 2016-07-31 DIAGNOSIS — C49A0 Gastrointestinal stromal tumor, unspecified site: Secondary | ICD-10-CM | POA: Diagnosis not present

## 2016-07-31 DIAGNOSIS — N360 Urethral fistula: Secondary | ICD-10-CM | POA: Diagnosis not present

## 2016-08-14 DIAGNOSIS — Z466 Encounter for fitting and adjustment of urinary device: Secondary | ICD-10-CM | POA: Diagnosis not present

## 2016-09-18 DIAGNOSIS — Z466 Encounter for fitting and adjustment of urinary device: Secondary | ICD-10-CM | POA: Diagnosis not present

## 2016-09-24 DIAGNOSIS — Z936 Other artificial openings of urinary tract status: Secondary | ICD-10-CM | POA: Diagnosis not present

## 2016-09-24 DIAGNOSIS — N135 Crossing vessel and stricture of ureter without hydronephrosis: Secondary | ICD-10-CM | POA: Diagnosis not present

## 2016-10-01 DIAGNOSIS — Z87891 Personal history of nicotine dependence: Secondary | ICD-10-CM | POA: Diagnosis not present

## 2016-10-01 DIAGNOSIS — Z436 Encounter for attention to other artificial openings of urinary tract: Secondary | ICD-10-CM | POA: Diagnosis not present

## 2016-10-01 DIAGNOSIS — D631 Anemia in chronic kidney disease: Secondary | ICD-10-CM | POA: Diagnosis not present

## 2016-10-01 DIAGNOSIS — E039 Hypothyroidism, unspecified: Secondary | ICD-10-CM | POA: Diagnosis not present

## 2016-10-01 DIAGNOSIS — Z85038 Personal history of other malignant neoplasm of large intestine: Secondary | ICD-10-CM | POA: Diagnosis not present

## 2016-10-01 DIAGNOSIS — N131 Hydronephrosis with ureteral stricture, not elsewhere classified: Secondary | ICD-10-CM | POA: Diagnosis not present

## 2016-10-01 DIAGNOSIS — N10 Acute tubulo-interstitial nephritis: Secondary | ICD-10-CM | POA: Diagnosis not present

## 2016-10-01 DIAGNOSIS — N186 End stage renal disease: Secondary | ICD-10-CM | POA: Diagnosis not present

## 2016-10-08 DIAGNOSIS — Z9049 Acquired absence of other specified parts of digestive tract: Secondary | ICD-10-CM | POA: Diagnosis not present

## 2016-10-08 DIAGNOSIS — E872 Acidosis: Secondary | ICD-10-CM | POA: Diagnosis not present

## 2016-10-08 DIAGNOSIS — Z87891 Personal history of nicotine dependence: Secondary | ICD-10-CM | POA: Diagnosis not present

## 2016-10-08 DIAGNOSIS — Z85038 Personal history of other malignant neoplasm of large intestine: Secondary | ICD-10-CM | POA: Diagnosis not present

## 2016-10-08 DIAGNOSIS — N135 Crossing vessel and stricture of ureter without hydronephrosis: Secondary | ICD-10-CM | POA: Diagnosis not present

## 2016-10-08 DIAGNOSIS — Z8500 Personal history of malignant neoplasm of unspecified digestive organ: Secondary | ICD-10-CM | POA: Diagnosis not present

## 2016-10-08 DIAGNOSIS — Z992 Dependence on renal dialysis: Secondary | ICD-10-CM | POA: Diagnosis not present

## 2016-10-08 DIAGNOSIS — D638 Anemia in other chronic diseases classified elsewhere: Secondary | ICD-10-CM | POA: Diagnosis not present

## 2016-10-08 DIAGNOSIS — Z9221 Personal history of antineoplastic chemotherapy: Secondary | ICD-10-CM | POA: Diagnosis not present

## 2016-10-08 DIAGNOSIS — E039 Hypothyroidism, unspecified: Secondary | ICD-10-CM | POA: Diagnosis not present

## 2016-10-08 DIAGNOSIS — N186 End stage renal disease: Secondary | ICD-10-CM | POA: Diagnosis not present

## 2016-10-11 DIAGNOSIS — N359 Urethral stricture, unspecified: Secondary | ICD-10-CM | POA: Diagnosis not present

## 2016-10-11 DIAGNOSIS — T83192A Other mechanical complication of urinary stent, initial encounter: Secondary | ICD-10-CM | POA: Diagnosis not present

## 2016-11-19 DIAGNOSIS — N135 Crossing vessel and stricture of ureter without hydronephrosis: Secondary | ICD-10-CM | POA: Diagnosis not present

## 2016-11-19 DIAGNOSIS — R339 Retention of urine, unspecified: Secondary | ICD-10-CM | POA: Diagnosis not present

## 2016-11-24 DIAGNOSIS — N183 Chronic kidney disease, stage 3 (moderate): Secondary | ICD-10-CM | POA: Diagnosis not present

## 2016-11-26 DIAGNOSIS — E872 Acidosis: Secondary | ICD-10-CM | POA: Diagnosis not present

## 2016-11-26 DIAGNOSIS — D631 Anemia in chronic kidney disease: Secondary | ICD-10-CM | POA: Diagnosis not present

## 2016-11-26 DIAGNOSIS — Z992 Dependence on renal dialysis: Secondary | ICD-10-CM | POA: Diagnosis not present

## 2016-11-26 DIAGNOSIS — I120 Hypertensive chronic kidney disease with stage 5 chronic kidney disease or end stage renal disease: Secondary | ICD-10-CM | POA: Diagnosis not present

## 2016-11-26 DIAGNOSIS — N2581 Secondary hyperparathyroidism of renal origin: Secondary | ICD-10-CM | POA: Diagnosis not present

## 2016-11-26 DIAGNOSIS — R809 Proteinuria, unspecified: Secondary | ICD-10-CM | POA: Diagnosis not present

## 2016-11-26 DIAGNOSIS — N185 Chronic kidney disease, stage 5: Secondary | ICD-10-CM | POA: Diagnosis not present

## 2016-12-10 DIAGNOSIS — Z436 Encounter for attention to other artificial openings of urinary tract: Secondary | ICD-10-CM | POA: Diagnosis not present

## 2016-12-10 DIAGNOSIS — Z85038 Personal history of other malignant neoplasm of large intestine: Secondary | ICD-10-CM | POA: Diagnosis not present

## 2016-12-10 DIAGNOSIS — N135 Crossing vessel and stricture of ureter without hydronephrosis: Secondary | ICD-10-CM | POA: Diagnosis not present

## 2017-01-01 DIAGNOSIS — R339 Retention of urine, unspecified: Secondary | ICD-10-CM | POA: Diagnosis not present
